# Patient Record
Sex: Male | Born: 1935 | Race: Black or African American | Hispanic: No | Smoking: Former smoker
Health system: Southern US, Community
[De-identification: ages and names within clinical notes are randomized; demographics above are authoritative.]

## PROBLEM LIST (undated history)

## (undated) DIAGNOSIS — IMO0001 Reserved for inherently not codable concepts without codable children: Secondary | ICD-10-CM

## (undated) DIAGNOSIS — M1990 Unspecified osteoarthritis, unspecified site: Secondary | ICD-10-CM

## (undated) DIAGNOSIS — I10 Essential (primary) hypertension: Secondary | ICD-10-CM

## (undated) DIAGNOSIS — I509 Heart failure, unspecified: Secondary | ICD-10-CM

## (undated) DIAGNOSIS — F419 Anxiety disorder, unspecified: Secondary | ICD-10-CM

## (undated) DIAGNOSIS — E785 Hyperlipidemia, unspecified: Secondary | ICD-10-CM

## (undated) DIAGNOSIS — C801 Malignant (primary) neoplasm, unspecified: Secondary | ICD-10-CM

## (undated) DIAGNOSIS — I2510 Atherosclerotic heart disease of native coronary artery without angina pectoris: Secondary | ICD-10-CM

## (undated) DIAGNOSIS — I739 Peripheral vascular disease, unspecified: Secondary | ICD-10-CM

## (undated) HISTORY — PX: PROSTATE SURGERY: SHX751

---

## 2003-12-29 DIAGNOSIS — C801 Malignant (primary) neoplasm, unspecified: Secondary | ICD-10-CM

## 2003-12-29 HISTORY — DX: Malignant (primary) neoplasm, unspecified: C80.1

## 2004-07-22 ENCOUNTER — Other Ambulatory Visit: Payer: Self-pay

## 2004-10-23 ENCOUNTER — Emergency Department: Payer: Self-pay | Admitting: Emergency Medicine

## 2004-11-01 ENCOUNTER — Emergency Department: Payer: Self-pay | Admitting: Emergency Medicine

## 2004-11-07 ENCOUNTER — Emergency Department: Payer: Self-pay | Admitting: Unknown Physician Specialty

## 2004-11-08 ENCOUNTER — Emergency Department: Payer: Self-pay | Admitting: Emergency Medicine

## 2004-11-10 ENCOUNTER — Emergency Department: Payer: Self-pay | Admitting: Emergency Medicine

## 2004-11-22 ENCOUNTER — Emergency Department: Payer: Self-pay | Admitting: Emergency Medicine

## 2004-12-26 ENCOUNTER — Emergency Department: Payer: Self-pay | Admitting: Emergency Medicine

## 2005-01-21 ENCOUNTER — Emergency Department: Payer: Self-pay | Admitting: Emergency Medicine

## 2005-01-30 ENCOUNTER — Emergency Department: Payer: Self-pay | Admitting: Emergency Medicine

## 2005-02-26 ENCOUNTER — Emergency Department: Payer: Self-pay | Admitting: Emergency Medicine

## 2005-05-25 ENCOUNTER — Emergency Department: Payer: Self-pay | Admitting: Emergency Medicine

## 2005-06-08 ENCOUNTER — Emergency Department: Payer: Self-pay | Admitting: Emergency Medicine

## 2005-06-13 ENCOUNTER — Emergency Department: Payer: Self-pay | Admitting: Unknown Physician Specialty

## 2005-06-15 ENCOUNTER — Emergency Department: Payer: Self-pay | Admitting: Emergency Medicine

## 2005-06-22 ENCOUNTER — Emergency Department: Payer: Self-pay | Admitting: Emergency Medicine

## 2005-07-12 ENCOUNTER — Emergency Department: Payer: Self-pay | Admitting: Internal Medicine

## 2005-12-31 ENCOUNTER — Emergency Department: Payer: Self-pay | Admitting: Emergency Medicine

## 2006-01-27 ENCOUNTER — Emergency Department: Payer: Self-pay | Admitting: General Practice

## 2006-12-24 ENCOUNTER — Ambulatory Visit: Payer: Self-pay | Admitting: Gastroenterology

## 2009-11-30 ENCOUNTER — Emergency Department: Payer: Self-pay | Admitting: Internal Medicine

## 2009-12-05 ENCOUNTER — Emergency Department: Payer: Self-pay | Admitting: Internal Medicine

## 2009-12-28 HISTORY — PX: KNEE ARTHROSCOPY: SUR90

## 2009-12-28 HISTORY — PX: JOINT REPLACEMENT: SHX530

## 2010-01-06 ENCOUNTER — Ambulatory Visit: Payer: Self-pay

## 2010-01-14 ENCOUNTER — Ambulatory Visit: Payer: Self-pay

## 2010-03-03 ENCOUNTER — Ambulatory Visit: Payer: Self-pay | Admitting: Orthopedic Surgery

## 2010-03-18 ENCOUNTER — Inpatient Hospital Stay: Payer: Self-pay | Admitting: Orthopedic Surgery

## 2010-05-01 ENCOUNTER — Ambulatory Visit: Payer: Self-pay | Admitting: Urology

## 2010-05-27 ENCOUNTER — Ambulatory Visit: Payer: Self-pay | Admitting: Orthopedic Surgery

## 2010-05-29 ENCOUNTER — Ambulatory Visit: Payer: Self-pay | Admitting: Orthopedic Surgery

## 2010-05-30 ENCOUNTER — Observation Stay: Payer: Self-pay | Admitting: Internal Medicine

## 2010-06-04 ENCOUNTER — Inpatient Hospital Stay: Payer: Self-pay | Admitting: Internal Medicine

## 2010-06-23 ENCOUNTER — Ambulatory Visit: Payer: Self-pay | Admitting: Otolaryngology

## 2010-07-09 ENCOUNTER — Emergency Department: Payer: Self-pay | Admitting: Emergency Medicine

## 2010-07-28 ENCOUNTER — Ambulatory Visit: Payer: Self-pay | Admitting: Surgery

## 2010-08-04 ENCOUNTER — Ambulatory Visit: Payer: Self-pay | Admitting: Surgery

## 2010-08-05 LAB — PATHOLOGY REPORT

## 2010-10-02 ENCOUNTER — Emergency Department: Payer: Self-pay | Admitting: Internal Medicine

## 2010-10-02 ENCOUNTER — Ambulatory Visit: Payer: Self-pay | Admitting: Orthopedic Surgery

## 2010-10-07 ENCOUNTER — Inpatient Hospital Stay: Payer: Self-pay | Admitting: Orthopedic Surgery

## 2010-10-08 LAB — PATHOLOGY REPORT

## 2010-10-23 ENCOUNTER — Emergency Department: Payer: Self-pay | Admitting: Emergency Medicine

## 2010-11-12 ENCOUNTER — Emergency Department: Payer: Self-pay | Admitting: Emergency Medicine

## 2011-02-13 ENCOUNTER — Ambulatory Visit: Payer: Self-pay | Admitting: Neurology

## 2011-10-27 ENCOUNTER — Ambulatory Visit: Payer: Self-pay | Admitting: Orthopedic Surgery

## 2012-01-18 ENCOUNTER — Ambulatory Visit: Payer: Self-pay | Admitting: Orthopedic Surgery

## 2013-11-24 ENCOUNTER — Emergency Department: Payer: Self-pay | Admitting: Emergency Medicine

## 2013-11-24 LAB — CBC
HCT: 39.5 % — ABNORMAL LOW (ref 40.0–52.0)
HGB: 13.1 g/dL (ref 13.0–18.0)
MCH: 30.1 pg (ref 26.0–34.0)
MCHC: 33.3 g/dL (ref 32.0–36.0)
MCV: 91 fL (ref 80–100)
RBC: 4.36 10*6/uL — ABNORMAL LOW (ref 4.40–5.90)
WBC: 7 10*3/uL (ref 3.8–10.6)

## 2013-11-24 LAB — BASIC METABOLIC PANEL
Calcium, Total: 9 mg/dL (ref 8.5–10.1)
Chloride: 104 mmol/L (ref 98–107)
Co2: 32 mmol/L (ref 21–32)
Creatinine: 1.17 mg/dL (ref 0.60–1.30)
Glucose: 97 mg/dL (ref 65–99)
Osmolality: 275 (ref 275–301)

## 2013-11-24 LAB — TROPONIN I
Troponin-I: 0.05 ng/mL
Troponin-I: 0.05 ng/mL

## 2013-12-28 HISTORY — PX: JOINT REPLACEMENT: SHX530

## 2014-09-08 ENCOUNTER — Emergency Department: Payer: Self-pay | Admitting: Emergency Medicine

## 2015-03-15 DIAGNOSIS — I5032 Chronic diastolic (congestive) heart failure: Secondary | ICD-10-CM | POA: Insufficient documentation

## 2015-10-14 ENCOUNTER — Encounter
Admission: RE | Admit: 2015-10-14 | Discharge: 2015-10-14 | Disposition: A | Discharge status: Home / Self Care | Payer: Medicare Other | Source: Ambulatory Visit | Attending: Orthopedic Surgery | Admitting: Orthopedic Surgery

## 2015-10-14 DIAGNOSIS — M1612 Unilateral primary osteoarthritis, left hip: Secondary | ICD-10-CM | POA: Diagnosis not present

## 2015-10-14 DIAGNOSIS — Z01818 Encounter for other preprocedural examination: Secondary | ICD-10-CM | POA: Diagnosis present

## 2015-10-14 HISTORY — DX: Malignant (primary) neoplasm, unspecified: C80.1

## 2015-10-14 HISTORY — DX: Heart failure, unspecified: I50.9

## 2015-10-14 HISTORY — DX: Unspecified osteoarthritis, unspecified site: M19.90

## 2015-10-14 HISTORY — DX: Reserved for inherently not codable concepts without codable children: IMO0001

## 2015-10-14 HISTORY — DX: Anxiety disorder, unspecified: F41.9

## 2015-10-14 HISTORY — DX: Essential (primary) hypertension: I10

## 2015-10-14 LAB — ABO/RH: ABO/RH(D): O POS

## 2015-10-14 LAB — URINALYSIS COMPLETE WITH MICROSCOPIC (ARMC ONLY)
Bacteria, UA: NONE SEEN
Bilirubin Urine: NEGATIVE
GLUCOSE, UA: NEGATIVE mg/dL
Ketones, ur: NEGATIVE mg/dL
Leukocytes, UA: NEGATIVE
Nitrite: NEGATIVE
PROTEIN: NEGATIVE mg/dL
SPECIFIC GRAVITY, URINE: 1.013 (ref 1.005–1.030)
pH: 6 (ref 5.0–8.0)

## 2015-10-14 LAB — CBC
HCT: 40.6 % (ref 40.0–52.0)
HEMOGLOBIN: 13.5 g/dL (ref 13.0–18.0)
MCH: 30.1 pg (ref 26.0–34.0)
MCHC: 33.1 g/dL (ref 32.0–36.0)
MCV: 91 fL (ref 80.0–100.0)
Platelets: 245 10*3/uL (ref 150–440)
RBC: 4.46 MIL/uL (ref 4.40–5.90)
RDW: 15 % — ABNORMAL HIGH (ref 11.5–14.5)
WBC: 7 10*3/uL (ref 3.8–10.6)

## 2015-10-14 LAB — BASIC METABOLIC PANEL
ANION GAP: 8 (ref 5–15)
BUN: 16 mg/dL (ref 6–20)
CHLORIDE: 101 mmol/L (ref 101–111)
CO2: 29 mmol/L (ref 22–32)
Calcium: 9.1 mg/dL (ref 8.9–10.3)
Creatinine, Ser: 1.14 mg/dL (ref 0.61–1.24)
GFR calc Af Amer: >60 mL/min (ref >60)
GFR, EST NON AFRICAN AMERICAN: 58 mL/min — AB (ref >60)
GLUCOSE: 102 mg/dL — AB (ref 65–99)
POTASSIUM: 3.5 mmol/L (ref 3.5–5.1)
SODIUM: 138 mmol/L (ref 135–145)

## 2015-10-14 LAB — APTT: APTT: 33 s (ref 24–36)

## 2015-10-14 LAB — SEDIMENTATION RATE: Sed Rate: 10 mm/hr (ref 0–20)

## 2015-10-14 LAB — SURGICAL PCR SCREEN
MRSA, PCR: NEGATIVE
STAPHYLOCOCCUS AUREUS: NEGATIVE

## 2015-10-14 LAB — PROTIME-INR
INR: 1.1
PROTHROMBIN TIME: 14.4 s (ref 11.4–15.0)

## 2015-10-14 LAB — TYPE AND SCREEN
ABO/RH(D): O POS
ANTIBODY SCREEN: NEGATIVE

## 2015-10-14 NOTE — Patient Instructions (Signed)
  Your procedure is scheduled EP:PIRJJOAC 1, 2016 (Tuesday) Report to Day Surgery.[facility]) To find out your arrival time please call [phone] between 1PM - 3PM on October 31,2016 (Monday).  Remember: Instructions that are not followed completely may result in serious medical risk, up to and including death, or upon the discretion of your surgeon and anesthesiologist your surgery may need to be rescheduled.    __x__ 1. Do not eat food or drink liquids after midnight. No gum chewing or hard candies.     ____ 2. No Alcohol for 24 hours before or after surgery.   ____ 3. Bring all medications with you on the day of surgery if instructed.    __x__ 4. Notify your doctor if there is any change in your medical condition     (cold, fever, infections).     Do not wear jewelry, make-up, hairpins, clips or nail polish.  Do not wear lotions, powders, or perfumes. You may wear deodorant.  Do not shave 48 hours prior to surgery. Men may shave face and neck.  Do not bring valuables to the hospital.    [facility] is not responsible for any belongings or valuables.               Contacts, dentures or bridgework may not be worn into surgery.  Leave your suitcase in the car. After surgery it may be brought to your room.  For patients admitted to the hospital, discharge time is determined by your                treatment team.   Patients discharged the day of surgery will not be allowed to drive home.   Please read over the following fact sheets that you were given:   MRSA Information and Surgical Site Infection Prevention   ____ Take these medicines the morning of surgery with A SIP OF WATER:    1. Atenolol  2.   3.   4.  5.  6.  ____ Fleet Enema (as directed)   __x__ Use CHG Soap as directed  ____ Use inhalers on the day of surgery  ____ Stop metformin 2 days prior to surgery    ____ Take 1/2 of usual insulin dose the night before surgery and none on the morning of surgery.    ____ Stop Coumadin/Plavix/aspirin on _x___ Stop Anti-inflammatories on (Tylenol ok to take for pain if needed)     ____ Stop supplements until after surgery.    ____ Bring C-Pap to the hospital.

## 2015-10-15 ENCOUNTER — Other Ambulatory Visit: Payer: Self-pay

## 2015-10-29 ENCOUNTER — Encounter: Payer: Self-pay | Admitting: *Deleted

## 2015-10-29 ENCOUNTER — Inpatient Hospital Stay
Admission: RE | Admit: 2015-10-29 | Discharge: 2015-11-01 | DRG: 470 | Disposition: A | Discharge status: Skilled Nursing Facility | Payer: Medicare Other | Source: Ambulatory Visit | Attending: Orthopedic Surgery | Admitting: Orthopedic Surgery

## 2015-10-29 ENCOUNTER — Inpatient Hospital Stay: Payer: Medicare Other

## 2015-10-29 ENCOUNTER — Inpatient Hospital Stay: Payer: Medicare Other | Admitting: Anesthesiology

## 2015-10-29 ENCOUNTER — Encounter
Admission: RE | Disposition: A | Discharge status: Skilled Nursing Facility | Payer: Self-pay | Source: Ambulatory Visit | Attending: Orthopedic Surgery

## 2015-10-29 DIAGNOSIS — E785 Hyperlipidemia, unspecified: Secondary | ICD-10-CM | POA: Diagnosis present

## 2015-10-29 DIAGNOSIS — D62 Acute posthemorrhagic anemia: Secondary | ICD-10-CM | POA: Diagnosis not present

## 2015-10-29 DIAGNOSIS — Z8546 Personal history of malignant neoplasm of prostate: Secondary | ICD-10-CM

## 2015-10-29 DIAGNOSIS — M1612 Unilateral primary osteoarthritis, left hip: Secondary | ICD-10-CM | POA: Diagnosis present

## 2015-10-29 DIAGNOSIS — G8918 Other acute postprocedural pain: Secondary | ICD-10-CM

## 2015-10-29 DIAGNOSIS — Z79899 Other long term (current) drug therapy: Secondary | ICD-10-CM | POA: Diagnosis not present

## 2015-10-29 DIAGNOSIS — Z96649 Presence of unspecified artificial hip joint: Secondary | ICD-10-CM | POA: Diagnosis present

## 2015-10-29 DIAGNOSIS — Z880 Allergy status to penicillin: Secondary | ICD-10-CM

## 2015-10-29 DIAGNOSIS — I509 Heart failure, unspecified: Secondary | ICD-10-CM | POA: Diagnosis present

## 2015-10-29 DIAGNOSIS — F419 Anxiety disorder, unspecified: Secondary | ICD-10-CM | POA: Diagnosis present

## 2015-10-29 DIAGNOSIS — Z96651 Presence of right artificial knee joint: Secondary | ICD-10-CM | POA: Diagnosis present

## 2015-10-29 DIAGNOSIS — R509 Fever, unspecified: Secondary | ICD-10-CM

## 2015-10-29 DIAGNOSIS — Z419 Encounter for procedure for purposes other than remedying health state, unspecified: Secondary | ICD-10-CM

## 2015-10-29 DIAGNOSIS — I110 Hypertensive heart disease with heart failure: Secondary | ICD-10-CM | POA: Diagnosis present

## 2015-10-29 DIAGNOSIS — Z96641 Presence of right artificial hip joint: Secondary | ICD-10-CM | POA: Diagnosis present

## 2015-10-29 HISTORY — PX: TOTAL HIP ARTHROPLASTY: SHX124

## 2015-10-29 LAB — TYPE AND SCREEN
ABO/RH(D): O POS
Antibody Screen: NEGATIVE

## 2015-10-29 LAB — CREATININE, SERUM
CREATININE: 1.47 mg/dL — AB (ref 0.61–1.24)
GFR calc Af Amer: 49 mL/min — ABNORMAL LOW (ref >60)
GFR, EST NON AFRICAN AMERICAN: 42 mL/min — AB (ref >60)

## 2015-10-29 LAB — CBC
HCT: 39 % — ABNORMAL LOW (ref 40.0–52.0)
HEMOGLOBIN: 13.1 g/dL (ref 13.0–18.0)
MCH: 30.7 pg (ref 26.0–34.0)
MCHC: 33.6 g/dL (ref 32.0–36.0)
MCV: 91.4 fL (ref 80.0–100.0)
PLATELETS: 274 10*3/uL (ref 150–440)
RBC: 4.27 MIL/uL — AB (ref 4.40–5.90)
RDW: 14.7 % — ABNORMAL HIGH (ref 11.5–14.5)
WBC: 8.7 10*3/uL (ref 3.8–10.6)

## 2015-10-29 SURGERY — ARTHROPLASTY, HIP, TOTAL, ANTERIOR APPROACH
Anesthesia: Spinal | Site: Hip | Laterality: Left | Wound class: Clean

## 2015-10-29 MED ORDER — DOCUSATE SODIUM 100 MG PO CAPS
100.0000 mg | ORAL_CAPSULE | Freq: Two times a day (BID) | ORAL | Status: DC
  Administered 2015-10-29 – 2015-11-01 (×6): 100 mg via ORAL
  Filled 2015-10-29 (×6): qty 1

## 2015-10-29 MED ORDER — ONDANSETRON HCL 4 MG PO TABS: 4.0000 mg | ORAL_TABLET | Freq: Four times a day (QID) | ORAL | Status: DC | PRN

## 2015-10-29 MED ORDER — METOCLOPRAMIDE HCL 5 MG/ML IJ SOLN: 5.0000 mg | Freq: Three times a day (TID) | INTRAMUSCULAR | Status: DC | PRN

## 2015-10-29 MED ORDER — BUPIVACAINE-EPINEPHRINE (PF) 0.25% -1:200000 IJ SOLN
INTRAMUSCULAR | Status: AC
  Filled 2015-10-29: qty 30

## 2015-10-29 MED ORDER — CLINDAMYCIN PHOSPHATE 900 MG/50ML IV SOLN
900.0000 mg | Freq: Four times a day (QID) | INTRAVENOUS | Status: AC
  Administered 2015-10-29 (×3): 900 mg via INTRAVENOUS
  Filled 2015-10-29 (×4): qty 50

## 2015-10-29 MED ORDER — PHENOL 1.4 % MT LIQD
1.0000 | OROMUCOSAL | Status: DC | PRN
  Filled 2015-10-29: qty 177

## 2015-10-29 MED ORDER — KETAMINE HCL 50 MG/ML IJ SOLN
INTRAMUSCULAR | Status: DC | PRN
  Administered 2015-10-29 (×2): 2 mg via INTRAMUSCULAR

## 2015-10-29 MED ORDER — LISINOPRIL 20 MG PO TABS
20.0000 mg | ORAL_TABLET | Freq: Every day | ORAL | Status: DC
  Administered 2015-10-29 – 2015-11-01 (×4): 20 mg via ORAL
  Filled 2015-10-29 (×4): qty 1

## 2015-10-29 MED ORDER — MORPHINE SULFATE (PF) 2 MG/ML IV SOLN: 2.0000 mg | INTRAVENOUS | Status: DC | PRN

## 2015-10-29 MED ORDER — OXYCODONE HCL 5 MG PO TABS
5.0000 mg | ORAL_TABLET | ORAL | Status: DC | PRN
  Administered 2015-10-29 – 2015-11-01 (×7): 5 mg via ORAL
  Filled 2015-10-29 (×7): qty 1

## 2015-10-29 MED ORDER — NEOMYCIN-POLYMYXIN B GU 40-200000 IR SOLN
Status: DC | PRN
  Administered 2015-10-29: 4 mL

## 2015-10-29 MED ORDER — FENTANYL CITRATE (PF) 100 MCG/2ML IJ SOLN
INTRAMUSCULAR | Status: DC | PRN
  Administered 2015-10-29: 100 ug via INTRAVENOUS

## 2015-10-29 MED ORDER — FENTANYL CITRATE (PF) 100 MCG/2ML IJ SOLN: 25.0000 ug | INTRAMUSCULAR | Status: DC | PRN

## 2015-10-29 MED ORDER — FAMOTIDINE 20 MG PO TABS
20.0000 mg | ORAL_TABLET | Freq: Once | ORAL | Status: AC
  Administered 2015-10-29: 20 mg via ORAL

## 2015-10-29 MED ORDER — ALUM & MAG HYDROXIDE-SIMETH 200-200-20 MG/5ML PO SUSP: 30.0000 mL | ORAL | Status: DC | PRN

## 2015-10-29 MED ORDER — ENOXAPARIN SODIUM 40 MG/0.4ML ~~LOC~~ SOLN
40.0000 mg | SUBCUTANEOUS | Status: DC
  Administered 2015-10-30 – 2015-11-01 (×3): 40 mg via SUBCUTANEOUS
  Filled 2015-10-29 (×3): qty 0.4

## 2015-10-29 MED ORDER — MAGNESIUM HYDROXIDE 400 MG/5ML PO SUSP
30.0000 mL | Freq: Every day | ORAL | Status: DC | PRN
  Administered 2015-10-30 – 2015-10-31 (×2): 30 mL via ORAL
  Filled 2015-10-29 (×2): qty 30

## 2015-10-29 MED ORDER — MENTHOL 3 MG MT LOZG
1.0000 | LOZENGE | OROMUCOSAL | Status: DC | PRN
  Filled 2015-10-29: qty 9

## 2015-10-29 MED ORDER — TRANEXAMIC ACID 1000 MG/10ML IV SOLN
INTRAVENOUS | Status: AC
  Filled 2015-10-29: qty 10

## 2015-10-29 MED ORDER — BUPIVACAINE IN DEXTROSE 0.75-8.25 % IT SOLN
INTRATHECAL | Status: DC | PRN
  Administered 2015-10-29: 2 mL via INTRATHECAL

## 2015-10-29 MED ORDER — PROPOFOL 10 MG/ML IV BOLUS
INTRAVENOUS | Status: DC | PRN
Start: 2015-10-29 — End: 2015-10-29
  Administered 2015-10-29 (×2): 20 mg via INTRAVENOUS

## 2015-10-29 MED ORDER — LACTATED RINGERS IV SOLN
INTRAVENOUS | Status: DC
  Administered 2015-10-29 (×2): via INTRAVENOUS

## 2015-10-29 MED ORDER — METHOCARBAMOL 500 MG PO TABS: 500.0000 mg | ORAL_TABLET | Freq: Four times a day (QID) | ORAL | Status: DC | PRN

## 2015-10-29 MED ORDER — TRANEXAMIC ACID 1000 MG/10ML IV SOLN
1000.0000 mg | INTRAVENOUS | Status: DC | PRN
  Administered 2015-10-29: 1000 mg via INTRAVENOUS

## 2015-10-29 MED ORDER — CLINDAMYCIN PHOSPHATE 900 MG/50ML IV SOLN
INTRAVENOUS | Status: AC
  Administered 2015-10-29: 900 mg via INTRAVENOUS
  Filled 2015-10-29: qty 50

## 2015-10-29 MED ORDER — ATENOLOL 25 MG PO TABS
25.0000 mg | ORAL_TABLET | Freq: Every day | ORAL | Status: DC
  Administered 2015-10-30 – 2015-11-01 (×3): 25 mg via ORAL
  Filled 2015-10-29 (×3): qty 1

## 2015-10-29 MED ORDER — ONDANSETRON HCL 4 MG/2ML IJ SOLN: 4.0000 mg | Freq: Four times a day (QID) | INTRAMUSCULAR | Status: DC | PRN

## 2015-10-29 MED ORDER — METOCLOPRAMIDE HCL 5 MG PO TABS: 5.0000 mg | ORAL_TABLET | Freq: Three times a day (TID) | ORAL | Status: DC | PRN

## 2015-10-29 MED ORDER — CLINDAMYCIN PHOSPHATE 900 MG/50ML IV SOLN: 900.0000 mg | Freq: Once | INTRAVENOUS | Status: DC

## 2015-10-29 MED ORDER — HYDROCHLOROTHIAZIDE 25 MG PO TABS
25.0000 mg | ORAL_TABLET | Freq: Every day | ORAL | Status: DC
  Administered 2015-10-29 – 2015-11-01 (×4): 25 mg via ORAL
  Filled 2015-10-29 (×4): qty 1

## 2015-10-29 MED ORDER — LISINOPRIL-HYDROCHLOROTHIAZIDE 20-25 MG PO TABS: 1.0000 | ORAL_TABLET | Freq: Every day | ORAL | Status: DC

## 2015-10-29 MED ORDER — ACETAMINOPHEN 325 MG PO TABS
650.0000 mg | ORAL_TABLET | Freq: Four times a day (QID) | ORAL | Status: DC | PRN
  Administered 2015-11-01: 650 mg via ORAL
  Filled 2015-10-29: qty 2

## 2015-10-29 MED ORDER — FAMOTIDINE 20 MG PO TABS
ORAL_TABLET | ORAL | Status: AC
  Administered 2015-10-29: 20 mg via ORAL
  Filled 2015-10-29: qty 1

## 2015-10-29 MED ORDER — SODIUM CHLORIDE 0.9 % IV SOLN
10000.0000 ug | INTRAVENOUS | Status: DC | PRN
  Administered 2015-10-29: 20 ug/min via INTRAVENOUS

## 2015-10-29 MED ORDER — BISACODYL 10 MG RE SUPP
10.0000 mg | Freq: Every day | RECTAL | Status: DC | PRN
  Administered 2015-11-01: 10 mg via RECTAL
  Filled 2015-10-29: qty 1

## 2015-10-29 MED ORDER — BUPIVACAINE-EPINEPHRINE 0.25% -1:200000 IJ SOLN
INTRAMUSCULAR | Status: DC | PRN
  Administered 2015-10-29: 30 mL

## 2015-10-29 MED ORDER — PROPOFOL 500 MG/50ML IV EMUL
INTRAVENOUS | Status: DC | PRN
  Administered 2015-10-29: 25 ug/kg/min via INTRAVENOUS

## 2015-10-29 MED ORDER — KETAMINE HCL 100 MG/ML IJ SOLN
250.0000 mg | INTRAMUSCULAR | Status: DC | PRN
  Administered 2015-10-29: 2.5 ug/kg/min via INTRAVENOUS

## 2015-10-29 MED ORDER — DIAZEPAM 5 MG PO TABS
5.0000 mg | ORAL_TABLET | Freq: Three times a day (TID) | ORAL | Status: DC | PRN
  Administered 2015-10-30 – 2015-10-31 (×2): 5 mg via ORAL
  Filled 2015-10-29 (×3): qty 1

## 2015-10-29 MED ORDER — MAGNESIUM CITRATE PO SOLN
1.0000 | Freq: Once | ORAL | Status: DC | PRN
  Filled 2015-10-29: qty 296

## 2015-10-29 MED ORDER — SODIUM CHLORIDE 0.9 % IV SOLN
INTRAVENOUS | Status: DC
  Administered 2015-10-29 – 2015-10-30 (×3): via INTRAVENOUS

## 2015-10-29 MED ORDER — NEOMYCIN-POLYMYXIN B GU 40-200000 IR SOLN
Status: AC
  Filled 2015-10-29: qty 20

## 2015-10-29 MED ORDER — METHOCARBAMOL 1000 MG/10ML IJ SOLN: 500.0000 mg | Freq: Four times a day (QID) | INTRAVENOUS | Status: DC | PRN

## 2015-10-29 MED ORDER — ACETAMINOPHEN 650 MG RE SUPP: 650.0000 mg | Freq: Four times a day (QID) | RECTAL | Status: DC | PRN

## 2015-10-29 SURGICAL SUPPLY — 45 items
BLADE SAW 1/2 (BLADE) ×3 IMPLANT
BNDG COHESIVE 6X5 TAN STRL LF (GAUZE/BANDAGES/DRESSINGS) ×6 IMPLANT
CANISTER SUCT 1200ML W/VALVE (MISCELLANEOUS) ×3 IMPLANT
CAPT HIP TOTAL 3 ×3 IMPLANT
CATH FOL LEG HOLDER (MISCELLANEOUS) ×3 IMPLANT
CATH TRAY METER 16FR LF (MISCELLANEOUS) ×3 IMPLANT
CHLORAPREP W/TINT 26ML (MISCELLANEOUS) ×3 IMPLANT
DRAPE C-ARM XRAY 36X54 (DRAPES) ×3 IMPLANT
DRAPE INCISE IOBAN 66X60 STRL (DRAPES) IMPLANT
DRAPE POUCH INSTRU U-SHP 10X18 (DRAPES) ×3 IMPLANT
DRAPE SHEET LG 3/4 BI-LAMINATE (DRAPES) ×9 IMPLANT
DRAPE TABLE BACK 80X90 (DRAPES) ×3 IMPLANT
DRSG OPSITE POSTOP 4X10 (GAUZE/BANDAGES/DRESSINGS) ×3 IMPLANT
ELECT BLADE 6.5 EXT (BLADE) ×3 IMPLANT
GAUZE SPONGE 4X4 12PLY STRL (GAUZE/BANDAGES/DRESSINGS) ×3 IMPLANT
GLOVE BIOGEL PI IND STRL 9 (GLOVE) ×1 IMPLANT
GLOVE BIOGEL PI INDICATOR 9 (GLOVE) ×2
GLOVE SURG ORTHO 9.0 STRL STRW (GLOVE) ×3 IMPLANT
GOWN SPECIALTY ULTRA XL (MISCELLANEOUS) ×3 IMPLANT
GOWN STRL REUS W/ TWL LRG LVL3 (GOWN DISPOSABLE) ×1 IMPLANT
GOWN STRL REUS W/TWL LRG LVL3 (GOWN DISPOSABLE) ×2
HEMOVAC 400CC 10FR (MISCELLANEOUS) ×3 IMPLANT
HOOD PEEL AWAY FACE SHEILD DIS (HOOD) ×3 IMPLANT
MAT BLUE FLOOR 46X72 FLO (MISCELLANEOUS) ×3 IMPLANT
NDL SAFETY 18GX1.5 (NEEDLE) ×3 IMPLANT
NEEDLE SPNL 18GX3.5 QUINCKE PK (NEEDLE) ×3 IMPLANT
NS IRRIG 1000ML POUR BTL (IV SOLUTION) ×3 IMPLANT
PACK HIP COMPR (MISCELLANEOUS) ×3 IMPLANT
SOL PREP PVP 2OZ (MISCELLANEOUS) ×3
SOLUTION PREP PVP 2OZ (MISCELLANEOUS) ×1 IMPLANT
STAPLER SKIN PROX 35W (STAPLE) ×3 IMPLANT
STRAP SAFETY BODY (MISCELLANEOUS) ×3 IMPLANT
SUT DVC 2 QUILL PDO  T11 36X36 (SUTURE) ×2
SUT DVC 2 QUILL PDO T11 36X36 (SUTURE) ×1 IMPLANT
SUT DVC QUILL MONODERM 30X30 (SUTURE) ×3 IMPLANT
SUT ETHIBOND NAB CT1 #1 30IN (SUTURE) ×3 IMPLANT
SUT SILK 0 (SUTURE) ×2
SUT SILK 0 30XBRD TIE 6 (SUTURE) ×1 IMPLANT
SUT VIC AB 1 CT1 36 (SUTURE) ×3 IMPLANT
SUT VIC AB 2-0 CT1 (SUTURE) ×3 IMPLANT
SYR 20CC LL (SYRINGE) ×3 IMPLANT
SYR 30ML LL (SYRINGE) ×3 IMPLANT
TAPE MICROFOAM 4IN (TAPE) ×3 IMPLANT
TUBE KAMVAC SUCTION (TUBING) ×3 IMPLANT
WATER STERILE IRR 1000ML POUR (IV SOLUTION) IMPLANT

## 2015-10-29 NOTE — Progress Notes (Signed)
From OR. IV out on arrival. IV restarted with #20 left hand with LR infusing without difficulty. Tolerated well.

## 2015-10-29 NOTE — Anesthesia Preprocedure Evaluation (Signed)
Anesthesia Evaluation  Patient identified by MRN, date of birth, ID band Patient awake    Reviewed: Allergy & Precautions, H&P , NPO status , Patient's Chart, lab work & pertinent test results, reviewed documented beta blocker date and time   History of Anesthesia Complications Negative for: history of anesthetic complications  Airway Mallampati: I  TM Distance: >3 FB Neck ROM: full    Dental no notable dental hx. (+) Edentulous Upper, Edentulous Lower, Upper Dentures, Lower Dentures   Pulmonary neg shortness of breath, neg sleep apnea, neg COPD, neg recent URI, former smoker,    Pulmonary exam normal breath sounds clear to auscultation       Cardiovascular Exercise Tolerance: Good hypertension, On Medications and On Home Beta Blockers (-) angina+CHF (one episode many years ago)  (-) CAD, (-) Past MI, (-) Cardiac Stents and (-) CABG Normal cardiovascular exam(-) dysrhythmias (-) Valvular Problems/Murmurs Rhythm:regular Rate:Normal     Neuro/Psych PSYCHIATRIC DISORDERS (anxiety) negative neurological ROS     GI/Hepatic negative GI ROS, Neg liver ROS,   Endo/Other  negative endocrine ROS  Renal/GU negative Renal ROS  negative genitourinary   Musculoskeletal   Abdominal   Peds  Hematology negative hematology ROS (+)   Anesthesia Other Findings Past Medical History:   Hypertension                                                 Anxiety                                                      Arthritis                                                    Cancer ([place])                                    2005           Comment:Prostate   CHF (congestive heart failure) (HCC)                         Shortness of breath dyspnea                                    Comment:on exertion   Reproductive/Obstetrics negative OB ROS                             Anesthesia Physical Anesthesia Plan  ASA:  III  Anesthesia Plan: Spinal   Post-op Pain Management:    Induction:   Airway Management Planned:   Additional Equipment:   Intra-op Plan:   Post-operative Plan:   Informed Consent: I have reviewed the patients History and Physical, chart, labs and discussed the procedure including the risks, benefits and alternatives for the proposed anesthesia with the patient or authorized representative  who has indicated his/her understanding and acceptance.   Dental Advisory Given  Plan Discussed with: Anesthesiologist, CRNA and Surgeon  Anesthesia Plan Comments:         Anesthesia Quick Evaluation

## 2015-10-29 NOTE — Transfer of Care (Signed)
Immediate Anesthesia Transfer of Care Note  Patient: Kyle Whitaker  Procedure(s) Performed: Procedure(s): TOTAL HIP ARTHROPLASTY ANTERIOR APPROACH (Left)  Patient Location: PACU  Anesthesia Type:Spinal  Level of Consciousness: awake, alert , oriented and patient cooperative  Airway & Oxygen Therapy: Patient Spontanous Breathing and Patient connected to nasal cannula oxygen  Post-op Assessment: Report given to RN and Post -op Vital signs reviewed and stable  Post vital signs: Reviewed and stable  Last Vitals:  Filed Vitals:   10/29/15 0944  BP: 111/64  Pulse: 62  Temp: 36.1 C  Resp: 16    Complications: No apparent anesthesia complications

## 2015-10-29 NOTE — Op Note (Signed)
10/29/2015  9:52 AM  PATIENT:  Kyle Whitaker  81 y.o. male  PRE-OPERATIVE DIAGNOSIS:  OSTEOARTHRITIS left hip  POST-OPERATIVE DIAGNOSIS:  OSTEOARTHRITIS left hip  PROCEDURE:  Procedure(s): TOTAL HIP ARTHROPLASTY ANTERIOR APPROACH (Left)  SURGEON: Laurene Footman, MD  ASSISTANTS: None  ANESTHESIA:   spinal  EBL:  Total I/O In: 1500 [I.V.:1500] Out: 550 [Urine:50; Blood:500]  BLOOD ADMINISTERED:none  DRAINS: none   LOCAL MEDICATIONS USED:  MARCAINE     SPECIMEN:  Source of Specimen:  Left femoral head  DISPOSITION OF SPECIMEN:  PATHOLOGY  COUNTS:  YES  TOURNIQUET:  * No tourniquets in log *  IMPLANTS: Medacta AMIS 4 lateralized stem, S 28 mm head, 52 mm Mpact cup DM with liner  DICTATION: .Dragon Dictation   The patient was brought to the operating room and after spinal anesthesia was obtained patient was placed on the operative table with the ipsilateral foot into the Medacta attachment, contralateral leg on a well-padded table. C-arm was brought in and preop template x-ray taken. After prepping and draping in usual sterile fashion appropriate patient identification and timeout procedures were completed. Anterior approach to the hip was obtained and centered over the greater trochanter and TFL muscle. The subcutaneous tissue was incised hemostasis being achieved by electrocautery. TFL fascia was incised and the muscle retracted laterally deep retractor placed. The lateral femoral circumflex vessels were identified and ligated. The anterior capsule was exposed and a capsulotomy performed. The neck was identified and a femoral neck cut carried out with a saw. The head was removed without difficulty and showed sclerotic femoral head and acetabulum. Reaming was carried out to 52 mm and a 52 mm cup trial gave appropriate tightness to the acetabular component a 52 mpact cup DM was impacted into position. There were extensive osteophytes along the posterior and medial acetabulum that  might've impinged and these were removed using an osteotome and rongeur area The leg was then externally rotated and ischiofemoral and pubofemoral releases carried out. The femur was sequentially broached to a size 4, size 4 stem standard and then lateralized offset trials were placed and the final components chosen. The 4 lateral stem was inserted along with a S 28 mm head and 52 mm liner. The hip was reduced and was stable the wound was thoroughly irrigated with a dilute Betadine solution. The deep fascia was closed using a heavy Quill after infiltration of 30 cc of quarter percent Sensorcaine with epinephrine. TXA was then injected into the joint.2-0 Quill to close the skin with skin staples Xeroform honeycomb dressing cover the wound .  PLAN OF CARE: Admit to inpatient

## 2015-10-29 NOTE — H&P (Signed)
Reviewed paper H+P, will be scanned into chart. No changes noted.  

## 2015-10-29 NOTE — Anesthesia Procedure Notes (Signed)
Spinal Patient location during procedure: OR Start time: 10/29/2015 7:20 AM End time: 10/29/2015 7:36 AM Staffing Anesthesiologist: Martha Clan Performed by: anesthesiologist  Preanesthetic Checklist Completed: patient identified, site marked, surgical consent, pre-op evaluation, timeout performed, IV checked, risks and benefits discussed and monitors and equipment checked Spinal Block Patient position: sitting Prep: ChloraPrep Patient monitoring: heart rate, blood pressure and continuous pulse ox Approach: midline Location: L4-5 Injection technique: single-shot Needle Needle type: Whitacre and Introducer  Needle gauge: 25 G Needle length: 9 cm

## 2015-10-29 NOTE — Progress Notes (Signed)
AP/lateral xray left hip.

## 2015-10-29 NOTE — Evaluation (Signed)
Physical Therapy Evaluation Patient Details Name: Kyle Whitaker MRN: [id number] DOB: March 21, 1930 Today's Date: 10/29/2015   History of Present Illness  Pt underwent L THR anterior approach without reported operative complications. He is POD#0 at time of evaluation. Pt is somewhat difficulty to understand at times but is AOx4 at time of evaluation. Pt denies falls in the last 12 months. He reports use of rollator for ambulation prior to admission.  Clinical Impression  Pt demonstrates considerable weakness with bed mobility, transfers, and ambulation. He is weak in standing with poor balance and difficulty shifting weight to LLE. Pt would like to return home but in current condition would need SNF placement due to gross weakness and difficulty with mobility. Pt will benefit from skilled PT services to address deficits in strength, balance, and mobility in order to return to full function at home.     Follow Up Recommendations SNF (Pt unsure if he would be willing to go)    Equipment Recommendations  None recommended by PT    Recommendations for Other Services       Precautions / Restrictions Precautions Precautions: Anterior Hip;Fall Precaution Booklet Issued: Yes (comment) Restrictions Weight Bearing Restrictions: Yes LLE Weight Bearing: Weight bearing as tolerated      Mobility  Bed Mobility Overal bed mobility: Needs Assistance Bed Mobility: Supine to Sit     Supine to sit: Mod assist;+2 for physical assistance     General bed mobility comments: Poor LLE abduction strength. Cues for sequencing  Transfers Overall transfer level: Needs assistance Equipment used: Rolling walker (2 wheeled) Transfers: Sit to/from Stand Sit to Stand: Mod assist;+2 physical assistance         General transfer comment: Pt requires cues for sequencing with transfer. Decreased LE power noted with increased time required to perform  Ambulation/Gait Ambulation/Gait assistance: Mod assist;+2  physical assistance Ambulation Distance (Feet): 3 Feet Assistive device: Rolling walker (2 wheeled) Gait Pattern/deviations: Step-to pattern;Antalgic;Decreased step length - right;Decreased step length - left Gait velocity: Decreased Gait velocity interpretation: <1.8 ft/sec, indicative of risk for recurrent falls General Gait Details: Pt requires increased time and heavy cues for proper sequencing to transfer from bed to recliner. Cues for upright posture and hip and knee extension.  Stairs            Wheelchair Mobility    Modified Rankin (Stroke Patients Only)       Balance Overall balance assessment: Needs assistance   Sitting balance-Leahy Scale: Fair       Standing balance-Leahy Scale: Poor                               Pertinent Vitals/Pain Pain Assessment: 0-10 Pain Score: 5  Pain Location: L hip Pain Intervention(s): Monitored during session;Premedicated before session    Home Living Family/patient expects to be discharged to:: Private residence Living Arrangements: Children Available Help at Discharge: Family Type of Home: House Home Access: Stairs to enter Entrance Stairs-Rails: Can reach both Entrance Stairs-Number of Steps: 1 Home Layout: One level Home Equipment: Environmental consultant - 2 wheels;Walker - 4 wheels;Shower seat;Wheelchair - manual;Grab bars - tub/shower (no BSC)      Prior Function Level of Independence: Independent with assistive device(s)               Hand Dominance        Extremity/Trunk Assessment   Upper Extremity Assessment: Overall WFL for tasks assessed  Lower Extremity Assessment: LLE deficits/detail   LLE Deficits / Details: Assist to perform SLR and SAQ. Full DF/PF. Pt denies numbness/tingling in LLE     Communication   Communication: Other (comment) (Difficult to understand at times)  Cognition Arousal/Alertness: Awake/alert Behavior During Therapy: WFL for tasks assessed/performed Overall  Cognitive Status: Within Functional Limits for tasks assessed                      General Comments      Exercises General Exercises - Lower Extremity Ankle Circles/Pumps: Strengthening;Both;10 reps;Supine Quad Sets: Strengthening;Both;10 reps;Supine Gluteal Sets: Strengthening;Both;10 reps;Supine Short Arc Quad: Strengthening;Both;10 reps;Supine Heel Slides: Strengthening;Both;10 reps;Supine Hip ABduction/ADduction: Strengthening;Both;10 reps;Supine Straight Leg Raises: Strengthening;Both;10 reps;Supine      Assessment/Plan    PT Assessment Patient needs continued PT services  PT Diagnosis Difficulty walking;Abnormality of gait;Generalized weakness;Acute pain   PT Problem List Decreased strength;Decreased range of motion;Decreased activity tolerance;Decreased balance;Decreased mobility;Decreased safety awareness;Pain  PT Treatment Interventions DME instruction;Gait training;Stair training;Therapeutic activities;Therapeutic exercise;Balance training;Neuromuscular re-education;Patient/family education;Manual techniques   PT Goals (Current goals can be found in the Care Plan section) Acute Rehab PT Goals Patient Stated Goal: "I think I want to go home if possible" PT Goal Formulation: With patient Time For Goal Achievement: 11/12/15 Potential to Achieve Goals: Fair    Frequency BID   Barriers to discharge        Co-evaluation               End of Session Equipment Utilized During Treatment: Gait belt Activity Tolerance: Patient tolerated treatment well Patient left: in chair;with call bell/phone within reach;with chair alarm set;with family/visitor present Nurse Communication: Mobility status         Time: 5427-0623 PT Time Calculation (min) (ACUTE ONLY): 35 min   Charges:   PT Evaluation $Initial PT Evaluation Tier I: 1 Procedure PT Treatments $Therapeutic Exercise: 8-22 mins   PT G Codes:       Lyndel Safe Khylie Larmore PT, DPT    [place] 10/29/2015, 4:50 PM

## 2015-10-30 MED ORDER — WHITE PETROLATUM GEL
Freq: Two times a day (BID) | Status: DC
  Administered 2015-10-30: 1 via TOPICAL
  Administered 2015-10-30 – 2015-11-01 (×4): via TOPICAL
  Filled 2015-10-30 (×2): qty 5
  Filled 2015-10-30: qty 28.35
  Filled 2015-10-30: qty 5
  Filled 2015-10-30: qty 28.35
  Filled 2015-10-30: qty 5
  Filled 2015-10-30: qty 28.35
  Filled 2015-10-30 (×2): qty 5

## 2015-10-30 NOTE — Progress Notes (Signed)
Physical Therapy Treatment Patient Details Name: Kyle Whitaker MRN: [id number] DOB: 03-04-1930 Today's Date: 10/30/2015    History of Present Illness This patient is an 81 year old male who came to [facility] for a L THR (anterior)    PT Comments    Pt demonstrates very slight improvement in strength and mobility on this date. He is still grossly weak with transfers and ambulation and severely limited in distance. Pt able to perform seated exercises but reports increased pain with hip flexion in sitting. Pt will benefit from skilled PT services to address deficits in strength, balance, and mobility in order to return to full function at home.    Follow Up Recommendations  SNF (Pt unsure if he would be willing to go)     Equipment Recommendations  None recommended by PT    Recommendations for Other Services       Precautions / Restrictions Precautions Precautions: Anterior Hip;Fall Precaution Booklet Issued: Yes (comment) (Per PT) Restrictions Weight Bearing Restrictions: Yes LLE Weight Bearing: Weight bearing as tolerated    Mobility  Bed Mobility Overal bed mobility: Needs Assistance Bed Mobility: Supine to Sit     Supine to sit: Mod assist;HOB elevated     General bed mobility comments: Poor LLE abduction strength. Cues for sequencing  Transfers Overall transfer level: Needs assistance Equipment used: Rolling walker (2 wheeled) Transfers: Sit to/from Stand Sit to Stand: Mod assist;+2 physical assistance         General transfer comment: Pt with decreased hip and knee extension strength. Cues for hand placement and sequencing as well as posture once upright in standing  Ambulation/Gait Ambulation/Gait assistance: Min assist;+2 physical assistance Ambulation Distance (Feet): 5 Feet Assistive device: Rolling walker (2 wheeled) Gait Pattern/deviations: Step-to pattern;Decreased step length - right;Decreased stance time - left;Decreased weight shift to left Gait  velocity: Decreased Gait velocity interpretation: <1.8 ft/sec, indicative of risk for recurrent falls General Gait Details: Pt is very slow and labored with gait. He stands in crouched posture requiring cues for full upright posture. Pt with decreased step length bilateral but particularly on the R. Decreased weight shift to LLE. Pt slowly requires more assistance until modA+2. Chair is pulled up underneath patient.   Stairs            Wheelchair Mobility    Modified Rankin (Stroke Patients Only)       Balance Overall balance assessment: Needs assistance   Sitting balance-Leahy Scale: Fair       Standing balance-Leahy Scale: Poor                      Cognition Arousal/Alertness: Awake/alert Behavior During Therapy: WFL for tasks assessed/performed Overall Cognitive Status: Within Functional Limits for tasks assessed                      Exercises General Exercises - Lower Extremity Ankle Circles/Pumps: Strengthening;Both;Supine;15 reps Quad Sets: Strengthening;Both;Supine;15 reps Gluteal Sets: Strengthening;Both;Supine;15 reps Long Arc Quad: Strengthening;Left;15 reps;Seated Hip ABduction/ADduction: Strengthening;Both;15 reps;Seated Straight Leg Raises: Strengthening;Both;Supine;15 reps Hip Flexion/Marching: Strengthening;Both;Seated;15 reps Heel Raises: Strengthening;15 reps;Left;Seated    General Comments        Pertinent Vitals/Pain Pain Assessment: 0-10 Pain Score: 6  Pain Location: L hip Pain Intervention(s): Monitored during session    Home Living Family/patient expects to be discharged to:: Private residence Living Arrangements: Children Available Help at Discharge: Family Type of Home: House Home Access: Stairs to enter Entrance Stairs-Rails: Can reach both Home Layout:  One level Home Equipment: [place] - 2 wheels;Walker - 4 wheels;Shower seat;Wheelchair - manual;Grab bars - tub/shower      Prior Function Level of Independence:  Independent with assistive device(s)      Comments: reports getting some assist with bathing from son   PT Goals (current goals can now be found in the care plan section) Acute Rehab PT Goals Patient Stated Goal: Would rather go home PT Goal Formulation: With patient Time For Goal Achievement: 11/12/15 Potential to Achieve Goals: Fair Progress towards PT goals: Progressing toward goals    Frequency  BID    PT Plan Current plan remains appropriate    Co-evaluation             End of Session Equipment Utilized During Treatment: Gait belt Activity Tolerance: Patient tolerated treatment well Patient left: with call bell/phone within reach;with chair alarm set;in chair;with SCD's reapplied (ice pack on hip)     Time: 3151-7616 PT Time Calculation (min) (ACUTE ONLY): 23 min  Charges:  $Gait Training: 8-22 mins $Therapeutic Exercise: 8-22 mins                    G Codes:      Lyndel Safe Huprich PT, DPT   Huprich,Jason 10/30/2015, 12:37 PM

## 2015-10-30 NOTE — Progress Notes (Signed)
Physical Therapy Treatment Patient Details Name: Kyle Whitaker MRN: [id number] DOB: 31-Aug-1930 Today's Date: 10/30/2015    History of Present Illness Pt underwent L THR anterior approach without reported operative complications. He is POD#0 at time of evaluation. Pt is somewhat difficulty to understand at times but is AOx4 at time of evaluation. Pt denies falls in the last 12 months. He reports use of rollator for ambulation prior to admission.    PT Comments    Pt demonstrates slight increase in ambulation distance although he becomes fatigued with worsening LE buckling and strength. Gait is very slow with patient requiring assist initially to advance LLE. However pt reports improving pain with bed exercises and ambulation. He continues to demonstrate poor L hip flexion strength requiring assist for SLR in supine. Pt able to complete all bed exercises with assistance. Pt will benefit from skilled PT services to address deficits in strength, balance, and mobility in order to return to full function at home.    Follow Up Recommendations  SNF (Pt unsure if he would be willing to go)     Equipment Recommendations  None recommended by PT    Recommendations for Other Services       Precautions / Restrictions Precautions Precautions: Anterior Hip;Fall Precaution Booklet Issued: Yes (comment) (Per PT) Restrictions Weight Bearing Restrictions: Yes LLE Weight Bearing: Weight bearing as tolerated    Mobility  Bed Mobility Overal bed mobility: Needs Assistance Bed Mobility: Supine to Sit;Sit to Supine     Supine to sit: Mod assist;HOB elevated;+2 for safety/equipment Sit to supine: Mod assist;HOB elevated   General bed mobility comments: Poor LLE abduction strength and poor L hip flexion strength. Cues for sequencing. Pt with good UE strength to pull himself up toward [facility]  Transfers Overall transfer level: Needs assistance Equipment used: Rolling walker (2 wheeled) Transfers: Sit  to/from Stand Sit to Stand: Mod assist;+2 physical assistance         General transfer comment: Pt with decreased hip and knee extension strength. Cues for hand placement and sequencing as well as posture once upright in standing. Once in standing decreased to minA+2 with ability to remain upright. Posture listing noted with poor ability to self-correct  Ambulation/Gait Ambulation/Gait assistance: Min assist;+2 physical assistance Ambulation Distance (Feet): 10 Feet Assistive device: Rolling walker (2 wheeled) Gait Pattern/deviations: Step-through pattern;Decreased step length - right;Decreased step length - left;Antalgic Gait velocity: Decreased Gait velocity interpretation: <1.8 ft/sec, indicative of risk for recurrent falls General Gait Details: Pt continues with significant crouched posture but per MD this is relatively close to baseline. However as ambulation distance progresses he present with more knee and hip flexion. Pt provided cues for sequencing and initially requiring assist to advance LLE. Assist and cues for turning to return to bed. Pt unsafe for further ambulation at this time due to fatigue and worsening LE strength   Stairs            Wheelchair Mobility    Modified Rankin (Stroke Patients Only)       Balance Overall balance assessment: Needs assistance   Sitting balance-Leahy Scale: Fair       Standing balance-Leahy Scale: Poor                      Cognition Arousal/Alertness: Awake/alert Behavior During Therapy: WFL for tasks assessed/performed Overall Cognitive Status: Within Functional Limits for tasks assessed  Exercises General Exercises - Lower Extremity Ankle Circles/Pumps: Strengthening;Both;Supine;15 reps Quad Sets: Strengthening;Both;Supine;15 reps Heel Slides: Strengthening;Supine;Left;15 reps Hip ABduction/ADduction: Strengthening;15 reps;Seated;Left Straight Leg Raises: Strengthening;Supine;15  reps;Left    General Comments        Pertinent Vitals/Pain Pain Assessment: 0-10 Pain Score: 6  Pain Location: L hip Pain Intervention(s): Monitored during session    Home Living                      Prior Function            PT Goals (current goals can now be found in the care plan section) Acute Rehab PT Goals Patient Stated Goal: Would rather go home PT Goal Formulation: With patient Time For Goal Achievement: 11/12/15 Potential to Achieve Goals: Fair Progress towards PT goals: Progressing toward goals    Frequency  BID    PT Plan Current plan remains appropriate    Co-evaluation             End of Session Equipment Utilized During Treatment: Gait belt Activity Tolerance: Patient tolerated treatment well Patient left: with call bell/phone within reach;with SCD's reapplied;in bed;with bed alarm set (pillow under feet)     Time: 7782-4235 PT Time Calculation (min) (ACUTE ONLY): 26 min  Charges:  $Gait Training: 8-22 mins $Therapeutic Exercise: 8-22 mins                    G Codes:      [name], DPT   [name] 10/30/2015, 4:56 PM

## 2015-10-30 NOTE — Clinical Social Work Note (Signed)
Clinical Social Work Assessment  Patient Details  Name: Kyle Whitaker MRN: [id number] Date of Birth: [birth year]  Date of referral:  10/30/15               Reason for consult:  Facility Placement                Permission sought to share information with:  Chartered certified accountant granted to share information::  Yes, Verbal Permission Granted  Name::      Benoit::   [place]   Relationship::     Contact Information:     Housing/Transportation Living arrangements for the past 2 months:  [employer] of Information:  Patient Patient Interpreter Needed:  None Criminal Activity/Legal Involvement Pertinent to Current Situation/Hospitalization:  No - Comment as needed Significant Relationships:  Adult Children Lives with:  Adult Children Do you feel safe going back to the place where you live?  Yes Need for family participation in patient care:  Yes (Comment)  Care giving concerns: Patient lives in [place] with his son [name], daughter in law, and granddaughter.    Social Worker assessment / plan: Holiday representative (CSW) received SNF consult. PT is recommending SNF. CSW met with patient alone at bedside. Patient was alert and oriented and laying in the bed. CSW introduced self and explained role of CSW department. Patient reported that he lives in [place] with his son [name], daughter in law and 79 y.o granddaughter. CSW explained that PT is recommending SNF. Patient reported that he is agreeable to SNF search in [facility] and prefers [facility]   FL2 complete. CSW will continue to follow and assist as needed.   Employment status:  Retired Forensic scientist:  Information systems manager, Medicaid In [place] PT Recommendations:  [place] / Referral to community resources:  [place]  Patient/Family's Response to care: Patient is agreeable to [facility] and prefers [facility]    Patient/Family's Understanding of and Emotional Response to Diagnosis, Current Treatment, and Prognosis: Patient was pleasant throughout assessment and thanked CSW for visit.   Emotional Assessment Appearance:  Appears stated age Attitude/Demeanor/Rapport:    Affect (typically observed):  Accepting, Adaptable, Pleasant Orientation:  Oriented to Self, Oriented to Place, Oriented to  Time, Oriented to Situation Alcohol / Substance use:  Not Applicable Psych involvement (Current and /or in the community):  No (Comment)  Discharge Needs  Concerns to be addressed:  Discharge Planning Concerns Readmission within the last 30 days:  No Current discharge risk:  None Barriers to Discharge:  Continued Medical Work up   Loralyn Freshwater, LCSW 10/30/2015, 11:23 AM

## 2015-10-30 NOTE — Evaluation (Signed)
Occupational Therapy Evaluation Patient Details Name: Kyle Whitaker MRN: [id number] DOB: 1930/12/24 Today's Date: 10/30/2015    History of Present Illness This patient is an 81 year old male who came to [facility] for a L THR (anterior)   Clinical Impression   This patient is an 81 year old male who came to [facility] for a L total hip replacement (anterior approach) .  Patient lives in a 1 story home with his son and daughter in [place].  He had been independent with dressing and got some help with bathing.  He now requires  assistance for lower body dressing and would benefit from Occupational Therapy for ADL/functional mobility training while .staying within hip precautions (anterior approach) .      Follow Up Recommendations       Equipment Recommendations       Recommendations for Other Services       Precautions / Restrictions Precautions Precautions: Anterior Hip;Fall Precaution Booklet Issued: Yes (comment) (Per PT) Restrictions Weight Bearing Restrictions: Yes LLE Weight Bearing: Weight bearing as tolerated      Mobility Bed Mobility                  Transfers                      Balance                                            ADL                                         General ADL Comments: Patient had been independent with dressing prior to sugery. He now requires assist. Reviewed techniques for lower body dressing using hip kit. Instructed patient that he may not need hip kit in a few days as he had an (anterior approach).     Vision     Perception     Praxis      Pertinent Vitals/Pain       Hand Dominance Right   Extremity/Trunk Assessment Upper Extremity Assessment Upper Extremity Assessment: Overall WFL for tasks assessed   Lower Extremity Assessment Lower Extremity Assessment: Defer to PT evaluation       Communication     Cognition Arousal/Alertness:  Awake/alert Behavior During Therapy: WFL for tasks assessed/performed Overall Cognitive Status: Within Functional Limits for tasks assessed                     General Comments       Exercises       Shoulder Instructions      Home Living Family/patient expects to be discharged to:: Private residence Living Arrangements: Children Available Help at Discharge: Family Type of Home: House Home Access: Stairs to enter Technical brewer of Steps: 1 Entrance Stairs-Rails: Can reach both Home Layout: One level     Bathroom Shower/Tub: International aid/development worker Accessibility: Yes   Home Equipment: Environmental consultant - 2 wheels;Walker - 4 wheels;Shower seat;Wheelchair - manual;Grab bars - tub/shower          Prior Functioning/Environment Level of Independence: Independent with assistive device(s)        Comments: reports getting some assist with bathing from son  OT Diagnosis:     OT Problem List:     OT Treatment/Interventions:      OT Goals(Current goals can be found in the care plan section)    OT Frequency:     Barriers to D/C:            Co-evaluation              End of Session    Activity Tolerance:   Patient left:  in bed with bed alarm on buzzer with in reach.   Time:  -    Charges:    G-Codes:    Myrene Galas, MS/OTR/L   10/30/2015, 10:55 AM

## 2015-10-30 NOTE — Progress Notes (Signed)
Clinical Social Worker (CSW) met with patient and presented bed offers. Patient chose [facility]. Patient reported that he would like to discuss D/C plan with his son before making a final decision. CSW contacted patient's son Benny and made him aware of above. Per son he would prefer to take patient home. Son is coming to [facility] this evening to discuss D/C plan with patient. CSW will continue to follow and assist as needed.    [place], LCSWA [phone] 

## 2015-10-30 NOTE — NC FL2 (Signed)
[place] LEVEL OF CARE SCREENING TOOL     IDENTIFICATION  Patient Name: Kyle Whitaker Birthdate: 1930/05/12 Sex: male Admission Date (Current Location): 10/29/2015  [facility] [place] Number:  ([place] )  ([phone] [id number]) Facility and Address:  [facility], [address], [place], [place] [address]      Provider Number: [id number] [phone])  Attending Physician Name and Address:  Hessie Knows, MD  Relative Name and Phone Number:       Current Level of Care: Hospital Recommended Level of Care: [place] Prior Approval Number:    Date Approved/Denied:   PASRR Number:  [id number])  Discharge Plan: SNF    Current Diagnoses: Patient Active Problem List   Diagnosis Date Noted  . Primary osteoarthritis of left hip 10/29/2015  Heart Attack Hyperlipidemia Hypertension Inguinal hernia Prostate Cancer Spasticity  Orientation ACTIVITIES/SOCIAL BLADDER RESPIRATION    Self, Time, Situation, Place  Active Continent Normal  BEHAVIORAL SYMPTOMS/MOOD NEUROLOGICAL BOWEL NUTRITION STATUS   (none)  (none) Continent Diet (Regular )  PHYSICIAN VISITS COMMUNICATION OF NEEDS Height & Weight Skin  30 days Verbally 5\' 8"  (172.7 cm) 210 lbs. Surgical wounds (Incision: Left Hip. )          AMBULATORY STATUS RESPIRATION    Assist extensive Normal      Personal Care Assistance Level of Assistance  Bathing, Feeding, Dressing Bathing Assistance: Limited assistance Feeding assistance: Independent Dressing Assistance: Limited assistance      Functional Limitations Info  Sight, Hearing, Speech Sight Info: Adequate Hearing Info: Adequate Speech Info: Adequate       SPECIAL CARE FACTORS FREQUENCY  PT (By licensed PT), OT (By licensed OT)     PT Frequency:  (5) OT Frequency:  (5)           Additional Factors Info  Code Status, Allergies Code Status Info:  (Full Code. ) Allergies Info:  (Penicillins. )           Current Medications (10/30/2015): Current Facility-Administered Medications  Medication Dose Route Frequency Provider Last Rate Last Dose  . 0.9 %  sodium chloride infusion   Intravenous Continuous Hessie Knows, MD 100 mL/hr at 10/30/15 1018    . acetaminophen (TYLENOL) tablet 650 mg  650 mg Oral Q6H PRN Hessie Knows, MD       Or  . acetaminophen (TYLENOL) suppository 650 mg  650 mg Rectal Q6H PRN Hessie Knows, MD      . alum & mag hydroxide-simeth (MAALOX/MYLANTA) 200-200-20 MG/5ML suspension 30 mL  30 mL Oral Q4H PRN Hessie Knows, MD      . atenolol (TENORMIN) tablet 25 mg  25 mg Oral Daily Hessie Knows, MD   25 mg at 10/30/15 0857  . bisacodyl (DULCOLAX) suppository 10 mg  10 mg Rectal Daily PRN Hessie Knows, MD      . diazepam (VALIUM) tablet 5 mg  5 mg Oral TID PRN Hessie Knows, MD   5 mg at 10/30/15 0332  . docusate sodium (COLACE) capsule 100 mg  100 mg Oral BID Hessie Knows, MD   100 mg at 10/30/15 0857  . enoxaparin (LOVENOX) injection 40 mg  40 mg Subcutaneous Q24H Hessie Knows, MD   40 mg at 10/30/15 0857  . lisinopril (PRINIVIL,ZESTRIL) tablet 20 mg  20 mg Oral Daily Hessie Knows, MD   20 mg at 10/30/15 0857   And  . hydrochlorothiazide (HYDRODIURIL) tablet 25 mg  25 mg Oral Daily Hessie Knows, MD   [phone]  mg at 10/30/15 0857  . magnesium citrate solution 1 Bottle  1 Bottle Oral Once PRN Hessie Knows, MD      . magnesium hydroxide (MILK OF MAGNESIA) suspension 30 mL  30 mL Oral Daily PRN Hessie Knows, MD      . menthol-cetylpyridinium (CEPACOL) lozenge 3 mg  1 lozenge Oral PRN Hessie Knows, MD       Or  . phenol (CHLORASEPTIC) mouth spray 1 spray  1 spray Mouth/Throat PRN Hessie Knows, MD      . methocarbamol (ROBAXIN) tablet 500 mg  500 mg Oral Q6H PRN Hessie Knows, MD      . metoCLOPramide (REGLAN) tablet 5-10 mg  5-10 mg Oral Q8H PRN Hessie Knows, MD       Or  . metoCLOPramide (REGLAN) injection 5-10 mg  5-10 mg Intravenous Q8H PRN Hessie Knows, MD      . morphine 2 MG/ML  injection 2 mg  2 mg Intravenous Q1H PRN Hessie Knows, MD      . ondansetron [facility]) tablet 4 mg  4 mg Oral Q6H PRN Hessie Knows, MD       Or  . ondansetron [facility]) injection 4 mg  4 mg Intravenous Q6H PRN Hessie Knows, MD      . oxyCODONE (Oxy IR/ROXICODONE) immediate release tablet 5-10 mg  5-10 mg Oral Q3H PRN Hessie Knows, MD   5 mg at 10/30/15 0447  . white petrolatum (VASELINE) gel   Topical BID Hessie Knows, MD       Do not use this list as official medication orders. Please verify with discharge summary.  Discharge Medications:   Medication List    ASK your doctor about these medications        atenolol 25 MG tablet  Commonly known as:  TENORMIN  Take 25 mg by mouth daily.     diazepam 5 MG tablet  Commonly known as:  VALIUM  Take 5 mg by mouth 3 (three) times daily as needed for anxiety.     lisinopril-hydrochlorothiazide 20-25 MG tablet  Commonly known as:  PRINZIDE,ZESTORETIC  Take 1 tablet by mouth daily.        Relevant Imaging Results:  Relevant Lab Results:  Recent Labs    Additional Information  (SSN: [id number])  Loralyn Freshwater, LCSW

## 2015-10-30 NOTE — Progress Notes (Signed)
   Subjective: 1 Day Post-Op Procedure(s) (LRB): TOTAL HIP ARTHROPLASTY ANTERIOR APPROACH (Left) Patient reports pain as mild.   Patient is well, and has had no acute complaints or problems We will start therapy today.  Plan is to go Rehab after hospital stay.  Objective: Vital signs in last 24 hours: Temp:  [95.7 F (35.4 C)-98.9 F (37.2 C)] 98.3 F (36.8 C) (11/02 0330) Pulse Rate:  [52-99] 99 (11/02 0330) Resp:  [10-18] 18 (11/02 0330) BP: (111-179)/(53-94) 132/73 mmHg (11/02 0330) SpO2:  [98 %-100 %] 98 % (11/02 0330)  Intake/Output from previous day: 11/01 0701 - 11/02 0700 In: 1600 [I.V.:1600] Out: 1695 [Urine:1195; Blood:500] Intake/Output this shift:     Recent Labs  10/29/15 0654  HGB 13.1    Recent Labs  10/29/15 0654  WBC 8.7  RBC 4.27*  HCT 39.0*  PLT 274    Recent Labs  10/29/15 0654  CREATININE 1.47*   No results for input(s): LABPT, INR in the last 72 hours.  EXAM General - Patient is Alert, Appropriate and Oriented Extremity - Neurovascular intact Sensation intact distally Intact pulses distally Dorsiflexion/Plantar flexion intact Dressing - dressing C/D/I and no drainage Motor Function - intact, moving foot and toes well on exam.   Past Medical History  Diagnosis Date  . Hypertension   . Anxiety   . Arthritis   . Cancer [facility]) 2005    Prostate  . CHF (congestive heart failure) ([place])   . Shortness of breath dyspnea     on exertion    Assessment/Plan:   1 Day Post-Op Procedure(s) (LRB): TOTAL HIP ARTHROPLASTY ANTERIOR APPROACH (Left) Active Problems:   Primary osteoarthritis of left hip  Estimated body mass index is 31.94 kg/(m^2) as calculated from the following:   Height as of this encounter: 5\' 8"  (1.727 m).   Weight as of this encounter: 95.255 kg (210 lb). Advance diet Up with therapy  Needs BM Recheck labs in the am  DVT Prophylaxis - Lovenox, Foot Pumps and TED hose Weight-Bearing as tolerated to left leg D/C  O2 and Pulse OX and try on Room Air  T. Rachelle Hora, PA-C [place] 10/30/2015, 8:01 AM

## 2015-10-30 NOTE — Anesthesia Postprocedure Evaluation (Signed)
  Anesthesia Post-op Note  Patient: Kyle Whitaker  Procedure(s) Performed: Procedure(s): TOTAL HIP ARTHROPLASTY ANTERIOR APPROACH (Left)  Anesthesia type:Spinal  Patient location: PACU  Post pain: Pain level controlled  Post assessment: Post-op Vital signs reviewed, Patient's Cardiovascular Status Stable, Respiratory Function Stable, Patent Airway and No signs of Nausea or vomiting  Post vital signs: Reviewed and stable  Last Vitals:  Filed Vitals:   10/30/15 0330  BP: 132/73  Pulse: 99  Temp: 36.8 C  Resp: 18    Level of consciousness: awake, alert  and patient cooperative  Complications: No apparent anesthesia complications

## 2015-10-30 NOTE — Progress Notes (Signed)
Pt uses vaseline 2 times daily for feet.  MD Rudene Christians notified, per MD ok to place order.  Pt also refusing foot pump and TEDS, stating "it causes him too much pain and hurts his feet". New orders received for SCD's. Will continue to monitor

## 2015-10-30 NOTE — Clinical Social Work Placement (Signed)
   CLINICAL SOCIAL WORK PLACEMENT  NOTE  Date:  10/30/2015  Patient Details  Name: Kyle Whitaker MRN: [id number] Date of Birth: August 03, 1930  Clinical Social Work is seeking post-discharge placement for this patient at the JAARS level of care (*CSW will initial, date and re-position this form in  chart as items are completed):  Yes   Patient/family provided with [place] Work Department's list of facilities offering this level of care within the geographic area requested by the patient (or if unable, by the patient's family).  Yes   Patient/family informed of their freedom to choose among providers that offer the needed level of care, that participate in Medicare, Medicaid or managed care program needed by the patient, have an available bed and are willing to accept the patient.  Yes   Patient/family informed of [facility]'s ownership interest in [facility] and [facility], as well as of the fact that they are under no obligation to receive care at these facilities.  PASRR submitted to EDS on       PASRR number received on       Existing PASRR number confirmed on 10/30/15     FL2 transmitted to all facilities in geographic area requested by pt/family on 10/30/15     FL2 transmitted to all facilities within larger geographic area on       Patient informed that his/her managed care company has contracts with or will negotiate with certain facilities, including the following:            Patient/family informed of bed offers received.  Patient chooses bed at       Physician recommends and patient chooses bed at      Patient to be transferred to   on  .  Patient to be transferred to facility by       Patient family notified on   of transfer.  Name of family member notified:        PHYSICIAN Please sign FL2     Additional Comment:    _______________________________________________ Loralyn Freshwater, LCSW 10/30/2015, 11:20  AM

## 2015-10-31 LAB — BASIC METABOLIC PANEL
Anion gap: 6 (ref 5–15)
BUN: 16 mg/dL (ref 6–20)
CHLORIDE: 102 mmol/L (ref 101–111)
CO2: 28 mmol/L (ref 22–32)
CREATININE: 1.36 mg/dL — AB (ref 0.61–1.24)
Calcium: 8.2 mg/dL — ABNORMAL LOW (ref 8.9–10.3)
GFR calc non Af Amer: 46 mL/min — ABNORMAL LOW (ref >60)
GFR, EST AFRICAN AMERICAN: 54 mL/min — AB (ref >60)
Glucose, Bld: 116 mg/dL — ABNORMAL HIGH (ref 65–99)
Potassium: 3.6 mmol/L (ref 3.5–5.1)
SODIUM: 136 mmol/L (ref 135–145)

## 2015-10-31 LAB — CBC
HCT: 34 % — ABNORMAL LOW (ref 40.0–52.0)
HEMOGLOBIN: 11.5 g/dL — AB (ref 13.0–18.0)
MCH: 30.8 pg (ref 26.0–34.0)
MCHC: 33.9 g/dL (ref 32.0–36.0)
MCV: 90.7 fL (ref 80.0–100.0)
Platelets: 200 10*3/uL (ref 150–440)
RBC: 3.75 MIL/uL — ABNORMAL LOW (ref 4.40–5.90)
RDW: 14.4 % (ref 11.5–14.5)
WBC: 13.7 10*3/uL — ABNORMAL HIGH (ref 3.8–10.6)

## 2015-10-31 LAB — SURGICAL PATHOLOGY

## 2015-10-31 NOTE — Progress Notes (Signed)
Physical Therapy Treatment Patient Details Name: Kyle Whitaker MRN: [id number] DOB: 21-Jun-1930 Today's Date: 10/31/2015    History of Present Illness Pt underwent L THR anterior approach without reported operative complications. He is POD#0 at time of evaluation. Pt is somewhat difficulty to understand at times but is AOx4 at time of evaluation. Pt denies falls in the last 12 months. He reports use of rollator for ambulation prior to admission.    PT Comments    Pt returned back to bed already and did not wish up in the chair this pm. Pt agreeable to bed exercises. Pain increased this pm; pt notes receiving medication. Demonstrates good active assisted range of motion left hip, fair quad set and glut set on left and active range of motion left knee. Continue PT to progress strength and active range of motion to allow for improved functional mobility.   Follow Up Recommendations  SNF     Equipment Recommendations  None recommended by PT    Recommendations for Other Services       Precautions / Restrictions Precautions Precautions: Anterior Hip;Fall Restrictions Weight Bearing Restrictions: Yes LLE Weight Bearing: Weight bearing as tolerated    Mobility  Bed Mobility Overal bed mobility: Needs Assistance Bed Mobility: Supine to Sit     Supine to sit: Mod assist;HOB elevated     General bed mobility comments: Repositioned pt in bed for improved comfort Mod A  Transfers Overall transfer level: Needs assistance Equipment used: Rolling walker (2 wheeled) Transfers: Sit to/from Stand Sit to Stand: Mod assist (cues for activation of hip/back extensors, safe hands )         General transfer comment: Pt requires cues for sequence for hand placement, more equal weight shift between LEs, and activation of hip/back extensors for improved stand/upright posture  Ambulation/Gait Ambulation/Gait assistance: Min assist (for balance; especially due to signirficant forward/R  lean) Ambulation Distance (Feet): 4 Feet Assistive device: Rolling walker (2 wheeled) Gait Pattern/deviations: Step-to pattern;Decreased step length - right;Decreased step length - left;Decreased stance time - left;Decreased dorsiflexion - right;Decreased dorsiflexion - left;Decreased weight shift to left;Wide base of support;Trunk flexed Gait velocity: Decreased Gait velocity interpretation: <1.8 ft/sec, indicative of risk for recurrent falls General Gait Details: Significant forward flexed posture subjectively a little more than baseline. Decreased weight shift to L; antalgic sounds, but pt states L hip not too painful. Effortful, but not improved steadiness with ambulation to chair.    Stairs            Wheelchair Mobility    Modified Rankin (Stroke Patients Only)       Balance Overall balance assessment: Needs assistance   Sitting balance-Leahy Scale: Fair (R lateral lean)   Postural control: Right lateral lean Standing balance support: Bilateral upper extremity supported Standing balance-Leahy Scale: Poor                      Cognition Arousal/Alertness: Awake/alert Behavior During Therapy: WFL for tasks assessed/performed Overall Cognitive Status: Within Functional Limits for tasks assessed                      Exercises Total Joint Exercises Ankle Circles/Pumps: AROM;Both;20 reps;Supine Quad Sets: Strengthening;Both;20 reps;Supine Gluteal Sets: Strengthening;Both;20 reps;Supine Towel Squeeze: Strengthening;Both;20 reps;Supine Short Arc Quad: AROM;Both;20 reps;Supine Heel Slides: AAROM;Left;20 reps;Supine (AROM R) Hip ABduction/ADduction: Both;20 reps;AAROM;Supine Straight Leg Raises: AAROM;Left;20 reps;Supine (AROM R) Long Arc Quad: AAROM;Left;20 reps;Seated (AROM R) Marching in Standing: AAROM;Left;20 reps;Seated (AROM R)  General Comments        Pertinent Vitals/Pain Pain Assessment: 0-10 Pain Score: 5  Pain Location: L hip Pain  Descriptors / Indicators: Aching Pain Intervention(s): Limited activity within patient's tolerance;Monitored during session;Premedicated before session;Repositioned    Home Living                      Prior Function            PT Goals (current goals can now be found in the care plan section) Progress towards PT goals: Progressing toward goals (slowly)    Frequency  BID    PT Plan Current plan remains appropriate    Co-evaluation             End of Session Equipment Utilized During Treatment: Gait belt Activity Tolerance: Patient tolerated treatment well;No increased pain Patient left: in bed;with call bell/phone within reach;with bed alarm set (did not wish leg pumps on )     Time: 3112-1624 PT Time Calculation (min) (ACUTE ONLY): 25 min  Charges:  $Gait Training: 8-22 mins $Therapeutic Exercise: 23-37 mins                    G Codes:      Charlaine Dalton 10/31/2015, 1:35 PM

## 2015-10-31 NOTE — Care Management Important Message (Signed)
Important Message  Patient Details  Name: Kyle Whitaker MRN: [id number] Date of Birth: February 28, 1930   Medicare Important Message Given:  Yes-second notification given    Marshell Garfinkel, RN 10/31/2015, 8:55 AM

## 2015-10-31 NOTE — Progress Notes (Signed)
Clinical Education officer, museum (CSW) met with patient to get choice between [facility] and going home. Patient requested CSW to call his son [name]. Per son patient will go to [facility]. Plan is for patient to D/[facility]. Kim admissions coordinator at [facility] is aware of above. CSW sill continue to follow and assist as needed.   Blima Rich, [place] [phone]

## 2015-10-31 NOTE — Progress Notes (Signed)
Physical Therapy Treatment Patient Details Name: Kyle Whitaker MRN: [id number] DOB: 1930-03-31 Today's Date: 10/31/2015    History of Present Illness Pt underwent L THR anterior approach without reported operative complications. He is POD#0 at time of evaluation. Pt is somewhat difficulty to understand at times but is AOx4 at time of evaluation. Pt denies falls in the last 12 months. He reports use of rollator for ambulation prior to admission.    PT Comments    Pt demonstrating slow progress with mobility, but able to manage STS transfer and short ambulation distance with less physical assist today. Continues to require cueing for safety and sequence with all mobility. Pt received up in chair and encouraged to stay up this am. Plan to see pt this pm for continued strengthening and mobility.   Follow Up Recommendations  SNF     Equipment Recommendations  None recommended by PT    Recommendations for Other Services       Precautions / Restrictions Precautions Precautions: Anterior Hip;Fall Restrictions Weight Bearing Restrictions: Yes LLE Weight Bearing: Weight bearing as tolerated    Mobility  Bed Mobility Overal bed mobility: Needs Assistance Bed Mobility: Supine to Sit     Supine to sit: Mod assist;HOB elevated     General bed mobility comments:  (Assist for BLEs and trunk; difficulty scooting to edge bed)  Transfers Overall transfer level: Needs assistance Equipment used: Rolling walker (2 wheeled) Transfers: Sit to/from Stand Sit to Stand: Mod assist (cues for activation of hip/back extensors, safe hands )         General transfer comment: Pt requires cues for sequence for hand placement, more equal weight shift between LEs, and activation of hip/back extensors for improved stand/upright posture  Ambulation/Gait Ambulation/Gait assistance: Min assist (for balance; especially due to signirficant forward/R lean) Ambulation Distance (Feet): 4 Feet Assistive  device: Rolling walker (2 wheeled) Gait Pattern/deviations: Step-to pattern;Decreased step length - right;Decreased step length - left;Decreased stance time - left;Decreased dorsiflexion - right;Decreased dorsiflexion - left;Decreased weight shift to left;Wide base of support;Trunk flexed Gait velocity: Decreased Gait velocity interpretation: <1.8 ft/sec, indicative of risk for recurrent falls General Gait Details: Significant forward flexed posture subjectively a little more than baseline. Decreased weight shift to L; antalgic sounds, but pt states L hip not too painful. Effortful, but not improved steadiness with ambulation to chair.    Stairs            Wheelchair Mobility    Modified Rankin (Stroke Patients Only)       Balance Overall balance assessment: Needs assistance   Sitting balance-Leahy Scale: Fair (R lateral lean)   Postural control: Right lateral lean Standing balance support: Bilateral upper extremity supported Standing balance-Leahy Scale: Poor                      Cognition Arousal/Alertness: Awake/alert Behavior During Therapy: WFL for tasks assessed/performed Overall Cognitive Status: Within Functional Limits for tasks assessed                      Exercises Total Joint Exercises Ankle Circles/Pumps: AROM;Both;20 reps (long sit) Quad Sets: Strengthening;Both;20 reps (long sit) Gluteal Sets: Strengthening;Both;20 reps (long sit) Towel Squeeze: Strengthening;Both;20 reps (long sit) Hip ABduction/ADduction: AROM;Both;20 reps;Seated Long Arc Quad: AAROM;Left;20 reps;Seated (AROM R) Marching in Standing: AAROM;Left;20 reps;Seated (AROM R)    General Comments        Pertinent Vitals/Pain Pain Assessment:  (Reports L hip is more restless than painful)  Pain Location: L hip Pain Descriptors / Indicators: Restless Pain Intervention(s): Monitored during session;Premedicated before session;Repositioned    Home Living                       Prior Function            PT Goals (current goals can now be found in the care plan section) Progress towards PT goals: Progressing toward goals (slowly)    Frequency  BID    PT Plan Current plan remains appropriate    Co-evaluation             End of Session Equipment Utilized During Treatment: Gait belt Activity Tolerance: Patient tolerated treatment well;Patient limited by fatigue;No increased pain Patient left: in chair;with call bell/phone within reach;with chair alarm set;with SCD's reapplied     Time: 6579-0383 PT Time Calculation (min) (ACUTE ONLY): 31 min  Charges:  $Gait Training: 8-22 mins $Therapeutic Exercise: 8-22 mins                    G Codes:      Charlaine Dalton 10/31/2015, 10:39 AM

## 2015-10-31 NOTE — Progress Notes (Signed)
   Subjective: 2 Days Post-Op Procedure(s) (LRB): TOTAL HIP ARTHROPLASTY ANTERIOR APPROACH (Left) Patient reports pain as mild.   Patient is well, and has had no acute complaints or problems We will start therapy today.  Plan is to go Rehab after hospital stay.  Objective: Vital signs in last 24 hours: Temp:  [98 F (36.7 C)-99.3 F (37.4 C)] 98 F (36.7 C) (11/03 0420) Pulse Rate:  [84-86] 85 (11/03 0420) Resp:  [14-18] 18 (11/03 0420) BP: (116-148)/(50-64) 116/50 mmHg (11/03 0420) SpO2:  [95 %-100 %] 98 % (11/03 0420)  Intake/Output from previous day: 11/02 0701 - 11/03 0700 In: -  Out: 750 [Urine:750] Intake/Output this shift:     Recent Labs  10/29/15 0654 10/31/15 0605  HGB 13.1 11.5*    Recent Labs  10/29/15 0654 10/31/15 0605  WBC 8.7 13.7*  RBC 4.27* 3.75*  HCT 39.0* 34.0*  PLT 274 200    Recent Labs  10/29/15 0654 10/31/15 0605  NA  --  136  K  --  3.6  CL  --  102  CO2  --  28  BUN  --  16  CREATININE 1.47* 1.36*  GLUCOSE  --  116*  CALCIUM  --  8.2*   No results for input(s): LABPT, INR in the last 72 hours.  EXAM General - Patient is Alert, Appropriate and Oriented Extremity - Neurovascular intact Sensation intact distally Intact pulses distally Dorsiflexion/Plantar flexion intact Dressing - dressing C/D/I and scant drainage Motor Function - intact, moving foot and toes well on exam.   Past Medical History  Diagnosis Date  . Hypertension   . Anxiety   . Arthritis   . Cancer [facility]) 2005    Prostate  . CHF (congestive heart failure) ([place])   . Shortness of breath dyspnea     on exertion    Assessment/Plan:   2 Days Post-Op Procedure(s) (LRB): TOTAL HIP ARTHROPLASTY ANTERIOR APPROACH (Left) Active Problems:   Primary osteoarthritis of left hip   Acute post op blood loss anemia     Estimated body mass index is 31.94 kg/(m^2) as calculated from the following:   Height as of this encounter: 5\' 8"  (1.727 m).   Weight as of  this encounter: 95.255 kg (210 lb). Advance diet Up with therapy  Needs BM Plan on discharge to rehab facility tomorrow  DVT Prophylaxis - Lovenox, Foot Pumps and TED hose Weight-Bearing as tolerated to left leg D/C O2 and Pulse OX and try on Room Air  T. Rachelle Hora, PA-C [place] 10/31/2015, 8:01 AM

## 2015-11-01 ENCOUNTER — Encounter
Admission: RE | Admit: 2015-11-01 | Discharge: 2015-11-01 | Disposition: A | Discharge status: Home / Self Care | Payer: Medicare Other | Source: Ambulatory Visit | Attending: Internal Medicine | Admitting: Internal Medicine

## 2015-11-01 ENCOUNTER — Inpatient Hospital Stay: Payer: Medicare Other

## 2015-11-01 LAB — URINALYSIS COMPLETE WITH MICROSCOPIC (ARMC ONLY)
BILIRUBIN URINE: NEGATIVE
GLUCOSE, UA: NEGATIVE mg/dL
Ketones, ur: NEGATIVE mg/dL
LEUKOCYTES UA: NEGATIVE
Nitrite: NEGATIVE
PH: 5 (ref 5.0–8.0)
Protein, ur: 30 mg/dL — AB
SPECIFIC GRAVITY, URINE: 1.018 (ref 1.005–1.030)

## 2015-11-01 MED ORDER — ENOXAPARIN SODIUM 40 MG/0.4ML ~~LOC~~ SOLN: 40.0000 mg | SUBCUTANEOUS | Status: DC

## 2015-11-01 MED ORDER — DIAZEPAM 5 MG PO TABS: 5.0000 mg | ORAL_TABLET | Freq: Three times a day (TID) | ORAL | Status: DC | PRN

## 2015-11-01 MED ORDER — OXYCODONE HCL 5 MG PO TABS: 5.0000 mg | ORAL_TABLET | ORAL | Status: DC | PRN

## 2015-11-01 NOTE — Progress Notes (Addendum)
Patient being discharged to [facility] today. Report called to [facility]. Belongings packed; EMS called. VSS at time of discharge. Patient A&O x4, no c/o pain.

## 2015-11-01 NOTE — Discharge Instructions (Signed)

## 2015-11-01 NOTE — Progress Notes (Signed)
Physical Therapy Treatment Patient Details Name: Kyle Whitaker MRN: [id number] DOB: Jun 08, 1930 Today's Date: 11/01/2015    History of Present Illness L anterior total hip replacement 10/29/15    PT Comments    Pt is pleasant t/o the session and shows good effort, but is still relatively weak and limited.  His biggest deficits are with standing/walking and his inability to attain upright posture take appropriate steps.  Pt is very slow, guarded and forward flexed despite much cuing and assist.   Follow Up Recommendations  SNF     Equipment Recommendations  None recommended by PT    Recommendations for Other Services       Precautions / Restrictions Precautions Precautions: Anterior Hip;Fall Restrictions LLE Weight Bearing: Weight bearing as tolerated    Mobility  Bed Mobility Overal bed mobility: Needs Assistance Bed Mobility: Supine to Sit     Supine to sit: Min assist;Mod assist     General bed mobility comments: Pt shows good effort trying to get to EOB w/o assist, but does end up needing some  Transfers Overall transfer level: Needs assistance Equipment used: Rolling walker (2 wheeled) Transfers: Sit to/from Stand Sit to Stand: Mod assist         General transfer comment: Pt is able to get to standing, but even with cues has considerable forward flexed posture and lack of extension at knees, hips, trunk  Ambulation/Gait Ambulation/Gait assistance: Mod assist Ambulation Distance (Feet): 6 Feet Assistive device: Rolling walker (2 wheeled)       General Gait Details: Pt continues to have poor posture and inability to straighten up.  He has slow, guarded and unconfident ambulation.  He is heavily reliant on his UEs and needs much cuing.   Stairs            Wheelchair Mobility    Modified Rankin (Stroke Patients Only)       Balance                                    Cognition Arousal/Alertness: Awake/alert Behavior During  Therapy: WFL for tasks assessed/performed Overall Cognitive Status: Within Functional Limits for tasks assessed                      Exercises Total Joint Exercises Ankle Circles/Pumps: AROM;Both;20 reps;Supine Quad Sets: Strengthening;10 reps Gluteal Sets: Strengthening;10 reps Short Arc Quad: 10 reps;AROM Heel Slides: 10 reps;AROM;Strengthening Hip ABduction/ADduction: 10 reps;Strengthening    General Comments        Pertinent Vitals/Pain Pain Score: 3     Home Living                      Prior Function            PT Goals (current goals can now be found in the care plan section) Progress towards PT goals: Progressing toward goals    Frequency  BID    PT Plan Current plan remains appropriate    Co-evaluation             End of Session Equipment Utilized During Treatment: Gait belt Activity Tolerance: Patient tolerated treatment well;No increased pain Patient left: with chair alarm set     Time: 8768-1157 PT Time Calculation (min) (ACUTE ONLY): 26 min  Charges:  $Gait Training: 8-22 mins $Therapeutic Exercise: 8-22 mins  G Codes:     Wayne Both, PT, DPT [phone]  Kreg Shropshire 11/01/2015, 11:24 AM

## 2015-11-01 NOTE — Progress Notes (Signed)
Patient is medically stable for D/C today. Per Kim admissions coordinator at [facility] patient is going to room 212. RN will call report at [phone] and arrange EMS for transport. Clinical Education officer, museum (CSW) sent D/C Summary and D/C packet to [employer]. Patient is aware of above. CSW contacted patient's son Kyle Whitaker and made him aware of above. Please reconsult if future social work needs arise. CSW signing off.   Blima Rich, [place] [phone]

## 2015-11-01 NOTE — Progress Notes (Signed)
   Subjective: 3 Days Post-Op Procedure(s) (LRB): TOTAL HIP ARTHROPLASTY ANTERIOR APPROACH (Left) Patient reports pain as mild.   Patient is well, and has had no acute complaints or problems We will continue therapy today.  Plan is to go Rehab after hospital stay. No CP/SOB/ABD pain  Objective: Vital signs in last 24 hours: Temp:  [98 F (36.7 C)-101.5 F (38.6 C)] 98.9 F (37.2 C) (11/04 0519) Pulse Rate:  [80-94] 91 (11/04 0357) Resp:  [18] 18 (11/04 0357) BP: (112-137)/(50-70) 137/60 mmHg (11/04 0357) SpO2:  [94 %-99 %] 94 % (11/04 0357)  Intake/Output from previous day: 11/03 0701 - 11/04 0700 In: 480 [P.O.:480] Out: 625 [Urine:625] Intake/Output this shift:     Recent Labs  10/31/15 0605  HGB 11.5*    Recent Labs  10/31/15 0605  WBC 13.7*  RBC 3.75*  HCT 34.0*  PLT 200    Recent Labs  10/31/15 0605  NA 136  K 3.6  CL 102  CO2 28  BUN 16  CREATININE 1.36*  GLUCOSE 116*  CALCIUM 8.2*   No results for input(s): LABPT, INR in the last 72 hours.  EXAM General - Patient is Alert, Appropriate and Oriented Extremity - Neurovascular intact Sensation intact distally Intact pulses distally Dorsiflexion/Plantar flexion intact Dressing - dressing C/D/I and scant drainage Motor Function - intact, moving foot and toes well on exam.   Past Medical History  Diagnosis Date  . Hypertension   . Anxiety   . Arthritis   . Cancer [facility]) 2005    Prostate  . CHF (congestive heart failure) ([place])   . Shortness of breath dyspnea     on exertion    Assessment/Plan:   3 Days Post-Op Procedure(s) (LRB): TOTAL HIP ARTHROPLASTY ANTERIOR APPROACH (Left) Active Problems:   Primary osteoarthritis of left hip   Acute post op blood loss anemia     Estimated body mass index is 31.94 kg/(m^2) as calculated from the following:   Height as of this encounter: 5\' 8"  (1.727 m).   Weight as of this encounter: 95.255 kg (210 lb). Advance diet Up with therapy  Needs  BM Plan on discharge to rehab facility CXR/UA, high fever this am. Encouraged incentive spirometry  DVT Prophylaxis - Lovenox, Foot Pumps and TED hose Weight-Bearing as tolerated to left leg D/C O2 and Pulse OX and try on Room Air  T. Rachelle Hora, PA-C [place] 11/01/2015, 7:55 AM

## 2015-11-01 NOTE — Clinical Social Work Placement (Signed)
   CLINICAL SOCIAL WORK PLACEMENT  NOTE  Date:  11/01/2015  Patient Details  Name: Kyle Whitaker MRN: [id number] Date of Birth: Jan 05, 1930  Clinical Social Work is seeking post-discharge placement for this patient at the [place] level of care (*CSW will initial, date and re-position this form in  chart as items are completed):  Yes   Patient/family provided with [facility] Department's list of facilities offering this level of care within the geographic area requested by the patient (or if unable, by the patient's family).  Yes   Patient/family informed of their freedom to choose among providers that offer the needed level of care, that participate in Medicare, Medicaid or managed care program needed by the patient, have an available bed and are willing to accept the patient.  Yes   Patient/family informed of [facility]'s ownership interest in [facility] and [facility], as well as of the fact that they are under no obligation to receive care at these facilities.  PASRR submitted to EDS on       PASRR number received on       Existing PASRR number confirmed on 10/30/15     FL2 transmitted to all facilities in geographic area requested by pt/family on 10/30/15     FL2 transmitted to all facilities within larger geographic area on       Patient informed that his/her managed care company has contracts with or will negotiate with certain facilities, including the following:        Yes   Patient/family informed of bed offers received.  Patient chooses bed at  [facility] )     Physician recommends and patient chooses bed at      Patient to be transferred to  [facility] ) on 11/01/15.  Patient to be transferred to facility by  [facility] )     Patient family notified on 11/01/15 of transfer.  Name of family member notified:   (Son Environmental consultant aware of D/C. )     PHYSICIAN       Additional Comment:     _______________________________________________ Loralyn Freshwater, LCSW 11/01/2015, 11:49 AM

## 2015-11-01 NOTE — Discharge Summary (Signed)
Physician Discharge Summary  Patient ID: Kyle Whitaker MRN: [id number] DOB/AGE: 79-Aug-1931 79 y.o.  Admit date: 10/29/2015 Discharge date: 11/01/2015  Admission Diagnoses:  OSTEOARTHRITIS   Discharge Diagnoses: Patient Active Problem List   Diagnosis Date Noted  . Primary osteoarthritis of left hip 10/29/2015    Past Medical History  Diagnosis Date  . Hypertension   . Anxiety   . Arthritis   . Cancer [facility]) 2005    Prostate  . CHF (congestive heart failure) ([place])   . Shortness of breath dyspnea     on exertion     Transfusion: none   Consultants (if any):    Discharged Condition: Improved  Hospital Course: Kyle Whitaker is an 81 y.o. male who was admitted 10/29/2015 with a diagnosis of left hip OA and went to the operating room on 10/29/2015 and underwent the above named procedures.    Surgeries: Procedure(s): TOTAL HIP ARTHROPLASTY ANTERIOR APPROACH on 10/29/2015 Patient tolerated the surgery well. Taken to PACU where she was stabilized and then transferred to the orthopedic floor.  Started on Lovenox 40 q 24 hrs. Foot pumps applied bilaterally at 80 mm. Heels elevated on bed with rolled towels. No evidence of DVT. Negative Homan. Physical therapy started on day #1 for gait training and transfer. OT started day #1 for ADL and assisted devices.  Patient's foley was d/c on day #1. Patient's IV  was d/c on day #2.  On post op day #3 patient was stable and ready for discharge to rehab facility. Patient with fever over night. Afebrile the remainder of today. CXR and UA negative.  Implants:Medacta AMIS 4 lateralized stem, S 28 mm head, 52 mm Mpact cup DM with liner  He was given perioperative antibiotics:  Anti-infectives    Start     Dose/Rate Route Frequency Ordered Stop   10/29/15 1100  clindamycin (CLEOCIN) IVPB 900 mg     900 mg 100 mL/hr over 30 Minutes Intravenous Every 6 hours 10/29/15 1057 10/29/15 2317   10/29/15 0615  clindamycin (CLEOCIN) IVPB 900 mg  Status:   Discontinued     900 mg 100 mL/hr over 30 Minutes Intravenous  Once 10/29/15 0605 10/29/15 1054   10/29/15 0552  clindamycin (CLEOCIN) 900 MG/50ML IVPB    Comments:  Slemenda, Debbie: cabinet override      10/29/15 0552 10/29/15 0817    .  He was given sequential compression devices, early ambulation, and lovenox for DVT prophylaxis.  He benefited maximally from the hospital stay and there were no complications.    Recent vital signs:  Filed Vitals:   11/01/15 0519  BP:   Pulse:   Temp: 98.9 F (37.2 C)  Resp:     Recent laboratory studies:  Lab Results  Component Value Date   HGB 11.5* 10/31/2015   HGB 13.1 10/29/2015   HGB 13.5 10/14/2015   Lab Results  Component Value Date   WBC 13.7* 10/31/2015   PLT 200 10/31/2015   Lab Results  Component Value Date   INR 1.10 10/14/2015   Lab Results  Component Value Date   NA 136 10/31/2015   K 3.6 10/31/2015   CL 102 10/31/2015   CO2 28 10/31/2015   BUN 16 10/31/2015   CREATININE 1.36* 10/31/2015   GLUCOSE 116* 10/31/2015    Discharge Medications:     Medication List    TAKE these medications        atenolol 25 MG tablet  Commonly known as:  TENORMIN  Take 25 mg by mouth daily.     diazepam 5 MG tablet  Commonly known as:  VALIUM  Take 5 mg by mouth 3 (three) times daily as needed for anxiety.     enoxaparin 40 MG/0.4ML injection  Commonly known as:  LOVENOX  Inject 0.4 mLs (40 mg total) into the skin daily.     lisinopril-hydrochlorothiazide 20-25 MG tablet  Commonly known as:  PRINZIDE,ZESTORETIC  Take 1 tablet by mouth daily.     oxyCODONE 5 MG immediate release tablet  Commonly known as:  Oxy IR/ROXICODONE  Take 1-2 tablets (5-10 mg total) by mouth every 3 (three) hours as needed for breakthrough pain.        Diagnostic Studies: Dg Hip Operative Unilat With Pelvis Left  10/29/2015  CLINICAL DATA:  Left hip replacement EXAM: OPERATIVE LEFT HIP (WITH PELVIS IF PERFORMED) 1 VIEWS TECHNIQUE:  Fluoroscopic spot image(s) were submitted for interpretation post-operatively. COMPARISON:  None. FINDINGS: Single intraoperative spot image demonstrates changes of left hip replacement. Normal AP alignment. No visible complicating feature. IMPRESSION: Left hip replacement as above. Electronically Signed   By: Rolm Baptise M.D.   On: 10/29/2015 09:42   Dg Hip Unilat W Or W/o Pelvis 2-3 Views Left  10/29/2015  CLINICAL DATA:  Postop left hip, pain. EXAM: DG HIP (WITH OR WITHOUT PELVIS) 2-3V LEFT COMPARISON:  None. FINDINGS: Changes of left hip replacement. Normal alignment. No hardware or bony complicating feature. IMPRESSION: Left hip replacement without complicating feature. Electronically Signed   By: Rolm Baptise M.D.   On: 10/29/2015 10:36    Disposition: Rehab        Follow-up Information    Follow up with [facility] .   Specialty:  [place] information:   [address] [place] [phone]      Follow up with MENZ,MICHAEL, MD In 2 weeks.   Specialty:  Orthopedic Surgery   Why:  For staple removal and skin check   Contact information:   [place] [address] [phone]        Signed: [place] 11/01/2015, 8:00 AM

## 2015-11-01 NOTE — Progress Notes (Signed)
Afebrile after being medicated for temp. Able to voice needs. No acute distress noted.

## 2015-11-09 LAB — URINALYSIS COMPLETE WITH MICROSCOPIC (ARMC ONLY)
BILIRUBIN URINE: NEGATIVE
Glucose, UA: NEGATIVE mg/dL
HGB URINE DIPSTICK: NEGATIVE
Ketones, ur: NEGATIVE mg/dL
LEUKOCYTES UA: NEGATIVE
Nitrite: NEGATIVE
PH: 8 (ref 5.0–8.0)
Protein, ur: NEGATIVE mg/dL
SQUAMOUS EPITHELIAL / LPF: NONE SEEN
Specific Gravity, Urine: 1.014 (ref 1.005–1.030)
WBC, UA: NONE SEEN WBC/hpf (ref 0–5)

## 2015-11-11 LAB — URINE CULTURE: Culture: >=100000

## 2015-11-12 LAB — URINALYSIS COMPLETE WITH MICROSCOPIC (ARMC ONLY)
BILIRUBIN URINE: NEGATIVE
GLUCOSE, UA: NEGATIVE mg/dL
Ketones, ur: NEGATIVE mg/dL
Nitrite: POSITIVE — AB
Protein, ur: NEGATIVE mg/dL
SPECIFIC GRAVITY, URINE: 1.017 (ref 1.005–1.030)
pH: 5 (ref 5.0–8.0)

## 2015-11-14 LAB — URINE CULTURE: Culture: >=100000

## 2015-11-17 ENCOUNTER — Emergency Department: Payer: Medicare Other

## 2015-11-17 ENCOUNTER — Emergency Department
Admission: EM | Admit: 2015-11-17 | Discharge: 2015-11-17 | Disposition: A | Discharge status: Home / Self Care | Payer: Medicare Other | Attending: Emergency Medicine | Admitting: Emergency Medicine

## 2015-11-17 ENCOUNTER — Encounter: Payer: Self-pay | Admitting: Emergency Medicine

## 2015-11-17 DIAGNOSIS — Z87891 Personal history of nicotine dependence: Secondary | ICD-10-CM | POA: Diagnosis not present

## 2015-11-17 DIAGNOSIS — M96842 Postprocedural seroma of a musculoskeletal structure following a musculoskeletal system procedure: Secondary | ICD-10-CM | POA: Diagnosis not present

## 2015-11-17 DIAGNOSIS — M96840 Postprocedural hematoma of a musculoskeletal structure following a musculoskeletal system procedure: Secondary | ICD-10-CM | POA: Insufficient documentation

## 2015-11-17 DIAGNOSIS — Z79899 Other long term (current) drug therapy: Secondary | ICD-10-CM | POA: Insufficient documentation

## 2015-11-17 DIAGNOSIS — S7002XA Contusion of left hip, initial encounter: Secondary | ICD-10-CM

## 2015-11-17 DIAGNOSIS — G8918 Other acute postprocedural pain: Secondary | ICD-10-CM | POA: Diagnosis present

## 2015-11-17 DIAGNOSIS — Z880 Allergy status to penicillin: Secondary | ICD-10-CM | POA: Diagnosis not present

## 2015-11-17 DIAGNOSIS — M25452 Effusion, left hip: Secondary | ICD-10-CM

## 2015-11-17 DIAGNOSIS — I10 Essential (primary) hypertension: Secondary | ICD-10-CM | POA: Insufficient documentation

## 2015-11-17 LAB — COMPREHENSIVE METABOLIC PANEL
ALBUMIN: 3.4 g/dL — AB (ref 3.5–5.0)
ALK PHOS: 83 U/L (ref 38–126)
ALT: 18 U/L (ref 17–63)
ANION GAP: 8 (ref 5–15)
AST: 28 U/L (ref 15–41)
BUN: 25 mg/dL — AB (ref 6–20)
CALCIUM: 9.1 mg/dL (ref 8.9–10.3)
CO2: 28 mmol/L (ref 22–32)
Chloride: 98 mmol/L — ABNORMAL LOW (ref 101–111)
Creatinine, Ser: 1.96 mg/dL — ABNORMAL HIGH (ref 0.61–1.24)
GFR calc Af Amer: 35 mL/min — ABNORMAL LOW (ref >60)
GFR calc non Af Amer: 30 mL/min — ABNORMAL LOW (ref >60)
GLUCOSE: 92 mg/dL (ref 65–99)
Potassium: 4 mmol/L (ref 3.5–5.1)
SODIUM: 134 mmol/L — AB (ref 135–145)
Total Bilirubin: 0.5 mg/dL (ref 0.3–1.2)
Total Protein: 7.2 g/dL (ref 6.5–8.1)

## 2015-11-17 LAB — CBC WITH DIFFERENTIAL/PLATELET
BASOS PCT: 0 %
Basophils Absolute: 0 10*3/uL (ref 0–0.1)
EOS PCT: 1 %
Eosinophils Absolute: 0.1 10*3/uL (ref 0–0.7)
HEMATOCRIT: 30.9 % — AB (ref 40.0–52.0)
Hemoglobin: 10.5 g/dL — ABNORMAL LOW (ref 13.0–18.0)
Lymphocytes Relative: 11 %
Lymphs Abs: 0.9 10*3/uL — ABNORMAL LOW (ref 1.0–3.6)
MCH: 30.6 pg (ref 26.0–34.0)
MCHC: 33.9 g/dL (ref 32.0–36.0)
MCV: 90.3 fL (ref 80.0–100.0)
MONO ABS: 1.5 10*3/uL — AB (ref 0.2–1.0)
MONOS PCT: 18 %
NEUTROS ABS: 5.9 10*3/uL (ref 1.4–6.5)
Neutrophils Relative %: 70 %
Platelets: 517 10*3/uL — ABNORMAL HIGH (ref 150–440)
RBC: 3.43 MIL/uL — ABNORMAL LOW (ref 4.40–5.90)
RDW: 14.1 % (ref 11.5–14.5)
WBC: 8.4 10*3/uL (ref 3.8–10.6)

## 2015-11-17 LAB — PROTIME-INR
INR: 1.14
PROTHROMBIN TIME: 14.8 s (ref 11.4–15.0)

## 2015-11-17 MED ORDER — ONDANSETRON HCL 4 MG/2ML IJ SOLN
4.0000 mg | Freq: Once | INTRAMUSCULAR | Status: AC
  Administered 2015-11-17: 4 mg via INTRAVENOUS
  Filled 2015-11-17: qty 2

## 2015-11-17 MED ORDER — MORPHINE SULFATE (PF) 2 MG/ML IV SOLN
2.0000 mg | Freq: Once | INTRAVENOUS | Status: AC
  Administered 2015-11-17: 2 mg via INTRAVENOUS
  Filled 2015-11-17: qty 1

## 2015-11-17 MED ORDER — SODIUM CHLORIDE 0.9 % IV BOLUS (SEPSIS)
500.0000 mL | Freq: Once | INTRAVENOUS | Status: AC
  Administered 2015-11-17: 500 mL via INTRAVENOUS

## 2015-11-17 NOTE — ED Notes (Signed)
Patient transported to Ultrasound 

## 2015-11-17 NOTE — ED Provider Notes (Signed)
[facility] Emergency Department Provider Note  ____________________________________________  Time seen: Approximately 5:38 AM  I have reviewed the triage vital signs and the nursing notes.   HISTORY  Chief Complaint Post-op Problem    HPI Kyle Whitaker is a 81 y.o. male who presents to the ED from rehabilitation facility via EMS with a chief complaint of left hip postoperative siteswelling and blood on bandage. Patient had left THA per Dr. Rudene Christians on November 1. Had staples removed last week. Nursing staff noted swelling to incision area with bleeding on bandage this morning and sent patient to the ED for further evaluation. Patient denies pain at postop site. He does complain of a burning sensation in his left calf and knee. States physical therapy had him up and walking yesterday. Denies fall or trauma. Denies fever, chills, chest pain, shortness of breath, abdominal pain, nausea, vomiting, diarrhea. Nursing staff reported temperature of 87F. Nothing makes his symptoms better or worse.   Past Medical History  Diagnosis Date  . Hypertension   . Anxiety   . Arthritis   . Cancer [facility]) 2005    Prostate  . CHF (congestive heart failure) ([place])   . Shortness of breath dyspnea     on exertion    Patient Active Problem List   Diagnosis Date Noted  . Primary osteoarthritis of left hip 10/29/2015    Past Surgical History  Procedure Laterality Date  . Joint replacement Right 2011    Total Hip Replacement, [facility]  . Joint replacement Right 2011    Total Knee Replacement, [facility]  . Knee arthroscopy Right 2011  . Prostate surgery    . Total hip arthroplasty Left 10/29/2015    Procedure: TOTAL HIP ARTHROPLASTY ANTERIOR APPROACH;  Surgeon: Hessie Knows, MD;  Location: [facility] ORS;  Service: Orthopedics;  Laterality: Left;    Current Outpatient Rx  Name  Route  Sig  Dispense  Refill  . atenolol (TENORMIN) 25 MG tablet   Oral   Take 25 mg by mouth daily.          . Cholecalciferol 4000 UNITS CAPS   Oral   Take 1 capsule by mouth daily.         . diazepam (VALIUM) 5 MG tablet   Oral   Take 1 tablet (5 mg total) by mouth 3 (three) times daily as needed for anxiety.   30 tablet   0   . hydrochlorothiazide (HYDRODIURIL) 25 MG tablet   Oral   Take 25 mg by mouth daily.         Marland Kitchen lisinopril (PRINIVIL,ZESTRIL) 20 MG tablet   Oral   Take 20 mg by mouth daily.         . magnesium hydroxide (MILK OF MAGNESIA) 400 MG/5ML suspension   Oral   Take 30 mLs by mouth every 4 (four) hours as needed for moderate constipation (no BM for 2 days).         Marland Kitchen oxyCODONE (OXY IR/ROXICODONE) 5 MG immediate release tablet   Oral   Take 1-2 tablets (5-10 mg total) by mouth every 3 (three) hours as needed for breakthrough pain.   30 tablet   0   . enoxaparin (LOVENOX) 40 MG/0.4ML injection   Subcutaneous   Inject 0.4 mLs (40 mg total) into the skin daily. Patient not taking: Reported on 11/17/2015   14 Syringe   0     Allergies Penicillins  History reviewed. No pertinent family history.  Social History Social History  Substance Use Topics  . Smoking status: Former Smoker -- 1.00 packs/day    Types: Cigarettes  . Smokeless tobacco: Never Used  . Alcohol Use: No    Review of Systems Constitutional: No fever/chills Eyes: No visual changes. ENT: No sore throat. Cardiovascular: Denies chest pain. Respiratory: Denies shortness of breath. Gastrointestinal: No abdominal pain.  No nausea, no vomiting.  No diarrhea.  No constipation. Genitourinary: Negative for dysuria. Musculoskeletal: Positive for left hip postoperative swelling and bloody discharge. Negative for back pain. Skin: Negative for rash. Neurological: Negative for headaches, focal weakness or numbness.  10-point ROS otherwise negative.  ____________________________________________   PHYSICAL EXAM:  VITAL SIGNS: ED Triage Vitals  Enc Vitals Group     BP 11/17/15 0525  131/49 mmHg     Pulse Rate 11/17/15 0525 83     Resp 11/17/15 0525 20     Temp 11/17/15 0525 98.7 F (37.1 C)     Temp Source 11/17/15 0525 Oral     SpO2 11/17/15 0525 100 %     Weight 11/17/15 0525 198 lb (89.812 kg)     Height 11/17/15 0525 5\' 9"  (1.753 m)     Head Cir --      Peak Flow --      Pain Score 11/17/15 0529 6     Pain Loc --      Pain Edu? --      Excl. in [place]? --     Constitutional: Alert and oriented. Well appearing and in no acute distress. Eyes: Conjunctivae are normal. PERRL. EOMI. Head: Atraumatic. Nose: No congestion/rhinnorhea. Mouth/Throat: Mucous membranes are moist.  Oropharynx non-erythematous. Neck: No stridor.   Cardiovascular: Normal rate, regular rhythm. Grossly normal heart sounds.  Good peripheral circulation. Respiratory: Normal respiratory effort.  No retractions. Lungs CTAB. Gastrointestinal: Soft and nontender. No distention. No abdominal bruits. No CVA tenderness. Musculoskeletal:  Left lower extremity: There is a palm size area of swelling along the incision site. Incision site has Steri-Strips which are intact. There is a bloody discharge on patient's bandage. Left calf is supple with mild tenderness. Left knee is nontender with full range of motion. 2+ femoral and distal pulses. Symmetrically warm limb without evidence for ischemia. Neurologic:  Normal speech and language. No gross focal neurologic deficits are appreciated. No gait instability. Skin:  Skin is warm, dry and intact. No rash noted. Psychiatric: Mood and affect are normal. Speech and behavior are normal.  ____________________________________________   LABS (all labs ordered are listed, but only abnormal results are displayed)  Labs Reviewed  COMPREHENSIVE METABOLIC PANEL - Abnormal; Notable for the following:    Sodium 134 (*)    Chloride 98 (*)    BUN 25 (*)    Creatinine, Ser 1.96 (*)    Albumin 3.4 (*)    GFR calc non Af Amer 30 (*)    GFR calc Af Amer 35 (*)    All  other components within normal limits  CBC WITH DIFFERENTIAL/PLATELET - Abnormal; Notable for the following:    RBC 3.43 (*)    Hemoglobin 10.5 (*)    HCT 30.9 (*)    Platelets 517 (*)    Lymphs Abs 0.9 (*)    Monocytes Absolute 1.5 (*)    All other components within normal limits  CULTURE, BLOOD (SINGLE)  CULTURE, BLOOD (ROUTINE X 2)  CULTURE, BLOOD (ROUTINE X 2)  PROTIME-INR   ____________________________________________  EKG  none ____________________________________________  RADIOLOGY  Doppler ultrasound interpreted per Dr. Marisue Humble: No  evidence of left lower extremity deep venous thrombosis. ____________________________________________   PROCEDURES  Procedure(s) performed: None  Critical Care performed: No  ____________________________________________   INITIAL IMPRESSION / ASSESSMENT AND PLAN / ED COURSE  Pertinent labs & imaging results that were available during my care of the patient were reviewed by me and considered in my medical decision making (see chart for details).  81 year old male who is approximately 20 days s/p left total hip arthroplasty who presents with swelling and bloody discharge at incision site. Differential diagnosis includes abscess, hematoma, seroma. Will obtain blood cultures, screening lab work and obtain CT imaging study of hip. Will also obtain Doppler ultrasound to evaluate for DVT given patient's complaint of left calf burning.  ----------------------------------------- 7:02 AM on 11/17/2015 -----------------------------------------  Patient in CT. Laboratory and ultrasound results noted. Care transferred to Dr. Reita Cliche pending results of CT scan. ____________________________________________   FINAL CLINICAL IMPRESSION(S) / ED DIAGNOSES  Final diagnoses:  Hip swelling, left      Paulette Blanch, MD 11/17/15 [phone]

## 2015-11-17 NOTE — ED Notes (Signed)
Patient presents to Emergency Department via EMS with complaints of  Swelling and pain to left hip s/p hip arthroplasty on 1 Nov, staples removed this week, RN at [facility] place noticed swelling and dried blood. Ice applied at facility.

## 2015-11-17 NOTE — ED Notes (Signed)
Patient transported to CT 

## 2015-11-17 NOTE — ED Provider Notes (Signed)
[facility]  I accepted care from Dr. Beather Arbour ____________________________________________     Kyle Whitaker were viewed by me. Imaging interpreted by radiologist.  CT left hip:  IMPRESSION: 1. Focal subcutaneous hematoma or seroma just deep to the incision at the anterior aspect of the left hip. 2. Left total hip arthroplasty demonstrates no complicating features.  ____________________________________________   PROCEDURES  Procedure(s) performed: None  Critical Care performed: None  ____________________________________________   INITIAL IMPRESSION / ASSESSMENT AND PLAN / ED COURSE  CONSULTATIONS: Orthopedics, Dr. Mack Guise and Dr. Rudene Christians  Pertinent labs & imaging results that were available during my care of the patient were reviewed by me and considered in my medical decision making (see chart for details).  I followed up on CT results, and CT shows subcutaneous seroma/hematoma. Discussed with on-call orthopedic doctor Mack Guise, and recommended to call pt's orthopedist Dr. Rudene Christians.  Dr. Rudene Christians not on call, but recommends patient be seen in office tomorrow morning for evaluation and likely aspiration of seroma/hematoma. No emergency need for aspiration in the emergency department.  Patient / Family / Caregiver informed of clinical course, medical decision-making process, and agree with plan.   I discussed return precautions, follow-up instructions, and discharged instructions with patient and/or family.  ____________________________________________   FINAL CLINICAL IMPRESSION(S) / ED DIAGNOSES  Final diagnoses:  Hip swelling, left  Hematoma of left hip, initial encounter     FOLLOW UP   Referred to: Dr. Rudene Christians, tomorrow   Lisa Roca, MD 11/17/15 [phone]

## 2015-11-17 NOTE — ED Notes (Signed)
Pt discharged with [facility] returning to [facility]. Facility notified of patient's return and discharge instructions sent with pt. NAD at this time.

## 2015-11-17 NOTE — Discharge Instructions (Signed)
You have a fluid collection called seroma/hematoma just underneath the skin. Change dressings as needed. Return to the emergency department for any worsening condition including any fever, skin rash, or any new or worsening swelling or pain.  Dr. Rudene Christians would like you to come to the office tomorrow for evaluation and likely aspiration of fluid.  Call office in the morning.  Seroma A seroma is a collection of fluid that looks like swelling or a mass on the body. Seromas form on the body where tissue has been injured or cut. They are most common after surgeries. Seromas vary in size. Some are small and painless. Others may become large and cause pain or discomfort. Many seromas go away on their own; the fluid is naturally absorbed by the body. Some may require the fluid to be drained through medical procedures.  CAUSES  Seromas form as the result of damage to tissue or the removal of tissue. This tissue damage may occur during surgery or because of an injury or trauma. When tissue is disrupted or removed, empty space is created. The body's natural defense system causes fluid to enter the empty space and form a seroma. SYMPTOMS   Swelling at the site of a surgical cut (incision) or an injury.  Drainage of clear fluid at the surgery or injury site.  Possible discomfort or pain. DIAGNOSIS  Your health care provider will perform a physical exam. During the exam, the health care provider will press on the seroma using a hand or fingers (palpation). Various tests may be ordered to help confirm the diagnosis. These tests may include:  Blood tests.  Imaging tests such as ultrasonography or computed tomography (CT). TREATMENT  Sometimes seromas resolve on their own and drain naturally in the body. Your health care provider may monitor you to make sure the seroma does not cause any complications. If your seroma does not resolve on its own, treatment may include:  Using a needle to drain the fluid from the  seroma (needle aspiration).  Inserting a flexible tube (catheter) to drain the fluid.  Applying a dressing, such as an elastic bandage or binder.  Use of antibiotic medicines if the seroma becomes infected.  In rare cases, surgery may be done to remove the seroma and repair the area. HOME CARE INSTRUCTIONS  Follow your health care provider's instructions regarding activity levels and any limitations on movements.  Only take over-the-counter or prescription medicines as directed by your health care provider.  If your health care provider prescribes antibiotics, take them as directed. Finish them even if you start to feel better.  Check your seroma every day for redness, warmth, or yellow drainage.  Follow up with your health care provider as directed. SEEK MEDICAL CARE IF:  You develop a fever.  You have pain, tenderness, redness, or warmth at the site of the seroma.  You notice yellow drainage coming from the site of the seroma.  Your seroma is getting bigger.   This information is not intended to replace advice given to you by your health care provider. Make sure you discuss any questions you have with your health care provider.   Document Released: 04/10/2013 Document Revised: 01/04/2015 Document Reviewed: 04/10/2013 Elsevier Interactive Patient Education [employer].

## 2015-11-22 LAB — CULTURE, BLOOD (ROUTINE X 2): CULTURE: NO GROWTH

## 2015-11-22 LAB — CULTURE, BLOOD (SINGLE): Culture: NO GROWTH

## 2016-06-11 ENCOUNTER — Emergency Department: Payer: Medicare Other

## 2016-06-11 ENCOUNTER — Encounter: Payer: Self-pay | Admitting: Emergency Medicine

## 2016-06-11 ENCOUNTER — Emergency Department
Admission: EM | Admit: 2016-06-11 | Discharge: 2016-06-11 | Disposition: A | Discharge status: Home / Self Care | Payer: Medicare Other | Attending: Emergency Medicine | Admitting: Emergency Medicine

## 2016-06-11 DIAGNOSIS — Z79899 Other long term (current) drug therapy: Secondary | ICD-10-CM | POA: Diagnosis not present

## 2016-06-11 DIAGNOSIS — I110 Hypertensive heart disease with heart failure: Secondary | ICD-10-CM | POA: Diagnosis not present

## 2016-06-11 DIAGNOSIS — R0989 Other specified symptoms and signs involving the circulatory and respiratory systems: Secondary | ICD-10-CM | POA: Diagnosis not present

## 2016-06-11 DIAGNOSIS — I6521 Occlusion and stenosis of right carotid artery: Secondary | ICD-10-CM | POA: Diagnosis not present

## 2016-06-11 DIAGNOSIS — Z96649 Presence of unspecified artificial hip joint: Secondary | ICD-10-CM | POA: Diagnosis not present

## 2016-06-11 DIAGNOSIS — I6523 Occlusion and stenosis of bilateral carotid arteries: Secondary | ICD-10-CM

## 2016-06-11 DIAGNOSIS — M1990 Unspecified osteoarthritis, unspecified site: Secondary | ICD-10-CM | POA: Insufficient documentation

## 2016-06-11 DIAGNOSIS — Z96659 Presence of unspecified artificial knee joint: Secondary | ICD-10-CM | POA: Insufficient documentation

## 2016-06-11 DIAGNOSIS — H9311 Tinnitus, right ear: Secondary | ICD-10-CM | POA: Diagnosis present

## 2016-06-11 DIAGNOSIS — I509 Heart failure, unspecified: Secondary | ICD-10-CM | POA: Diagnosis not present

## 2016-06-11 DIAGNOSIS — Z8546 Personal history of malignant neoplasm of prostate: Secondary | ICD-10-CM | POA: Insufficient documentation

## 2016-06-11 DIAGNOSIS — Z87891 Personal history of nicotine dependence: Secondary | ICD-10-CM | POA: Diagnosis not present

## 2016-06-11 LAB — CBC
HEMATOCRIT: 37.1 % — AB (ref 40.0–52.0)
Hemoglobin: 12.6 g/dL — ABNORMAL LOW (ref 13.0–18.0)
MCH: 30.3 pg (ref 26.0–34.0)
MCHC: 34 g/dL (ref 32.0–36.0)
MCV: 89.3 fL (ref 80.0–100.0)
Platelets: 244 10*3/uL (ref 150–440)
RBC: 4.16 MIL/uL — ABNORMAL LOW (ref 4.40–5.90)
RDW: 15.5 % — AB (ref 11.5–14.5)
WBC: 8.5 10*3/uL (ref 3.8–10.6)

## 2016-06-11 LAB — COMPREHENSIVE METABOLIC PANEL
ALBUMIN: 4.1 g/dL (ref 3.5–5.0)
ALT: 17 U/L (ref 17–63)
ANION GAP: 7 (ref 5–15)
AST: 34 U/L (ref 15–41)
Alkaline Phosphatase: 73 U/L (ref 38–126)
BILIRUBIN TOTAL: 0.5 mg/dL (ref 0.3–1.2)
BUN: 16 mg/dL (ref 6–20)
CO2: 28 mmol/L (ref 22–32)
Calcium: 9.3 mg/dL (ref 8.9–10.3)
Chloride: 104 mmol/L (ref 101–111)
Creatinine, Ser: 1.31 mg/dL — ABNORMAL HIGH (ref 0.61–1.24)
GFR, EST AFRICAN AMERICAN: 56 mL/min — AB (ref >60)
GFR, EST NON AFRICAN AMERICAN: 48 mL/min — AB (ref >60)
Glucose, Bld: 124 mg/dL — ABNORMAL HIGH (ref 65–99)
POTASSIUM: 3.5 mmol/L (ref 3.5–5.1)
Sodium: 139 mmol/L (ref 135–145)
TOTAL PROTEIN: 7 g/dL (ref 6.5–8.1)

## 2016-06-11 MED ORDER — IOPAMIDOL (ISOVUE-370) INJECTION 76%
75.0000 mL | Freq: Once | INTRAVENOUS | Status: AC | PRN
  Administered 2016-06-11: 75 mL via INTRAVENOUS

## 2016-06-11 NOTE — ED Notes (Signed)
Pt with humming in ears x3 weeks, sent over from next care for further eval.

## 2016-06-11 NOTE — ED Provider Notes (Signed)
Patient's CT of the neck shows the following: IMPRESSION: 1. No acute abnormality involving the major arterial vasculature of the neck. 2. Atheromatous plaque at the right carotid bifurcation/proximal right ICA with associated stenosis of up to 75-80% by NASCET criteria. This stenosis begins at the bifurcation, and extends for approximately 1 cm in length. 3. Focal plaque at the left carotid bifurcation with associated short-segment stenosis of approximately 50% by NASCET criteria. 4. Moderate atheromatous stenosis at the origin of the right vertebral artery, with multi focal atheromatous irregularity throughout the pre foraminal right V1 segment. Dominant right vertebral artery otherwise widely patent within the neck. 5. 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 There is patchy irregular filling distally within the left is V2 segment, likely via muscular branches. More regular patent flow within the left V3 and V4 segments. 6. Extensive degenerative spondylolysis throughout the cervical spine as above.  At this time, the patient's only symptom is a whooshing sensation that he hears. He has no neurologic deficits. He will need outpatient evaluation, and I'll give him the follow-up instructions for the vascular surgeon on-call.  Eula Listen, MD 06/11/16 2326

## 2016-06-11 NOTE — Discharge Instructions (Signed)
No certain cause was found, however your exam and evaluation are reassuring in the emergency department today.  Return to emergency department worsening condition including chest pain, palpitations, trouble breathing, weakness, numbness, dizziness or passing out, confusion or altered mental status, or any other symptoms concerning to you.   Tinnitus Tinnitus refers to hearing a sound when there is no actual source for that sound. This is often described as ringing in the ears. However, people with this condition may hear a variety of noises. A person may hear the sound in one ear or in both ears.  The sounds of tinnitus can be soft, loud, or somewhere in between. Tinnitus can last for a few seconds or can be constant for days. It may go away without treatment and come back at various times. When tinnitus is constant or happens often, it can lead to other problems, such as trouble sleeping and trouble concentrating. Almost everyone experiences tinnitus at some point. Tinnitus that is long-lasting (chronic) or comes back often is a problem that may require medical attention.  CAUSES  The cause of tinnitus is often not known. In some cases, it can result from other problems or conditions, including:   Exposure to loud noises from machinery, music, or other sources.  Hearing loss.  Ear or sinus infections.  Earwax buildup.  A foreign object in the ear.  Use of certain medicines.  Use of alcohol and caffeine.  High blood pressure.  Heart diseases.  Anemia.  Allergies.  Meniere disease.  Thyroid problems.  Tumors.  An enlarged part of a weakened blood vessel (aneurysm). SYMPTOMS The main symptom of tinnitus is hearing a sound when there is no source for that sound. It may sound like:   Buzzing.  Roaring.  Ringing.  Blowing air, similar to the sound heard when you listen to a seashell.  Hissing.  Whistling.  Sizzling.  Humming.  Running water.  A sustained  musical note. DIAGNOSIS  Tinnitus is diagnosed based on your symptoms. Your health care provider will do a physical exam. A comprehensive hearing exam (audiologic exam) will be done if your tinnitus:   Affects only one ear (unilateral).  Causes hearing difficulties.  Lasts 6 months or longer. You may also need to see a health care provider who specializes in hearing disorders (audiologist). You may be asked to complete a questionnaire to determine the severity of your tinnitus. Tests may be done to help determine the cause and to rule out other conditions. These can include:  Imaging studies of your head and brain, such as:  A CT scan.  An MRI.  An imaging study of your blood vessels (angiogram). TREATMENT  Treating an underlying medical condition can sometimes make tinnitus go away. If your tinnitus continues, other treatments may include:  Medicines, such as certain antidepressants or sleeping aids.  Sound generators to mask the tinnitus. These include:  Tabletop sound machines that play relaxing sounds to help you fall asleep.  Wearable devices that fit in your ear and play sounds or music.  A small device that uses headphones to deliver a signal embedded in music (acoustic neural stimulation). In time, this may change the pathways of your brain and make you less sensitive to tinnitus. This device is used for very severe cases when no other treatment is working.  Therapy and counseling to help you manage the stress of living with tinnitus.  Using hearing aids or cochlear implants, if your tinnitus is related to hearing loss. HOME CARE  INSTRUCTIONS  When possible, avoid being in loud places and being exposed to loud sounds.  Wear hearing protection, such as earplugs, when you are exposed to loud noises.  Do not take stimulants, such as nicotine, alcohol, or caffeine.  Practice techniques for reducing stress, such as meditation, yoga, or deep breathing.  Use a white noise  machine, a humidifier, or other devices to mask the sound of tinnitus.  Sleep with your head slightly raised. This may reduce the impact of tinnitus.  Try to get plenty of rest each night. SEEK MEDICAL CARE IF:  You have tinnitus in just one ear.  Your tinnitus continues for 3 weeks or longer without stopping.  Home care measures are not helping.  You have tinnitus after a head injury.  You have tinnitus along with any of the following:  Dizziness.  Loss of balance.  Nausea and vomiting.   This information is not intended to replace advice given to you by your health care provider. Make sure you discuss any questions you have with your health care provider.   Document Released: 12/14/2005 Document Revised: 01/04/2015 Document Reviewed: 05/16/2014 Elsevier Interactive Patient Education [employer].

## 2016-06-11 NOTE — ED Provider Notes (Signed)
[facility] Emergency Department Provider Note   ____________________________________________  Time seen: Approximately 7:25 PM I have reviewed the triage vital signs and the triage nursing note.  HISTORY  Chief Complaint Tinnitus   Historian Patient  HPI Kyle Whitaker is a 82 y.o. male with a history of CHF, and arthritis as well as hypertension, presented to urgent care earlier today with a complaint of hearing a whooshing sound in his left ear. He was sent here after being found to have a left carotid bruit.  Extremities actually had some of this symptomatology for couple of weeks now. No ear discharge. No ear pain. No jaw pain. No palpitations. No chest pain. No trouble breathing. No weakness or numbness. No confusion or headache.    Past Medical History  Diagnosis Date  . Hypertension   . Anxiety   . Arthritis   . Cancer [facility]) 2005    Prostate  . CHF (congestive heart failure) ([place])   . Shortness of breath dyspnea     on exertion    Patient Active Problem List   Diagnosis Date Noted  . Primary osteoarthritis of left hip 10/29/2015    Past Surgical History  Procedure Laterality Date  . Joint replacement Right 2011    Total Hip Replacement, [facility]  . Joint replacement Right 2011    Total Knee Replacement, [facility]  . Knee arthroscopy Right 2011  . Prostate surgery    . Total hip arthroplasty Left 10/29/2015    Procedure: TOTAL HIP ARTHROPLASTY ANTERIOR APPROACH;  Surgeon: Hessie Knows, MD;  Location: [facility] ORS;  Service: Orthopedics;  Laterality: Left;    Current Outpatient Rx  Name  Route  Sig  Dispense  Refill  . atenolol (TENORMIN) 25 MG tablet   Oral   Take 25 mg by mouth daily.         . Cholecalciferol 4000 UNITS CAPS   Oral   Take 1 capsule by mouth daily.         . diazepam (VALIUM) 5 MG tablet   Oral   Take 1 tablet (5 mg total) by mouth 3 (three) times daily as needed for anxiety.   30 tablet   0   . enoxaparin  (LOVENOX) 40 MG/0.4ML injection   Subcutaneous   Inject 0.4 mLs (40 mg total) into the skin daily. Patient not taking: Reported on 11/17/2015   14 Syringe   0   . hydrochlorothiazide (HYDRODIURIL) 25 MG tablet   Oral   Take 25 mg by mouth daily.         Marland Kitchen lisinopril (PRINIVIL,ZESTRIL) 20 MG tablet   Oral   Take 20 mg by mouth daily.         . magnesium hydroxide (MILK OF MAGNESIA) 400 MG/5ML suspension   Oral   Take 30 mLs by mouth every 4 (four) hours as needed for moderate constipation (no BM for 2 days).         Marland Kitchen oxyCODONE (OXY IR/ROXICODONE) 5 MG immediate release tablet   Oral   Take 1-2 tablets (5-10 mg total) by mouth every 3 (three) hours as needed for breakthrough pain.   30 tablet   0     Allergies Penicillins  No family history on file.  Social History Social History  Substance Use Topics  . Smoking status: Former Smoker -- 1.00 packs/day    Types: Cigarettes  . Smokeless tobacco: Never Used  . Alcohol Use: No    Review of Systems  Constitutional:  Negative for fever. Eyes: Negative for visual changes. ENT: Negative for sore throat. Cardiovascular: Negative for chest pain. Respiratory: Negative for shortness of breath. Gastrointestinal: Negative for abdominal pain, vomiting and diarrhea. Genitourinary: Negative for dysuria. Musculoskeletal: Negative for back pain. Skin: Negative for rash. Neurological: Negative for headache. 10 point Review of Systems otherwise negative ____________________________________________   PHYSICAL EXAM:  VITAL SIGNS: ED Triage Vitals  Enc Vitals Group     BP 06/11/16 1643 157/67 mmHg     Pulse Rate 06/11/16 1643 77     Resp 06/11/16 1643 20     Temp 06/11/16 1643 97.8 F (36.6 C)     Temp Source 06/11/16 1643 Oral     SpO2 06/11/16 1643 99 %     Weight --      Height --      Head Cir --      Peak Flow --      Pain Score --      Pain Loc --      Pain Edu? --      Excl. in [place]? --       Constitutional: Alert and oriented. Well appearing and in no distress. HEENT   Head: Normocephalic and atraumatic.      Eyes: Conjunctivae are normal. PERRL. Normal extraocular movements.      Ears:   Normal TMs bilaterally. Normal ear canal. No tender mastoid. No cervical lymphadenopathy.      Nose: No congestion/rhinnorhea.   Mouth/Throat: Mucous membranes are moist.   Neck: No stridor. Left carotid bruit. Cardiovascular/Chest: Normal rate, regular rhythm.  No murmurs, rubs, or gallops. Respiratory: Normal respiratory effort without tachypnea nor retractions. Breath sounds are clear and equal bilaterally. No wheezes/rales/rhonchi. Gastrointestinal: Soft. No distention, no guarding, no rebound. Nontender.    Genitourinary/rectal:Deferred Musculoskeletal: Nontender with normal range of motion in all extremities.  Neurologic:  Normal speech and language. No gross or focal neurologic deficits are appreciated. Skin:  Skin is warm, dry and intact. No rash noted. Psychiatric: Mood and affect are normal. Speech and behavior are normal. Patient exhibits appropriate insight and judgment.  ____________________________________________   EKG I, Lisa Roca, MD, the attending physician have personally viewed and interpreted all ECGs.  None ____________________________________________  LABS (pertinent positives/negatives)  Labs Reviewed  CBC - Abnormal; Notable for the following:    RBC 4.16 (*)    Hemoglobin 12.6 (*)    HCT 37.1 (*)    RDW 15.5 (*)    All other components within normal limits  COMPREHENSIVE METABOLIC PANEL - Abnormal; Notable for the following:    Glucose, Bld 124 (*)    Creatinine, Ser 1.31 (*)    GFR calc non Af Amer 48 (*)    GFR calc Af Amer 56 (*)    All other components within normal limits    ____________________________________________  RADIOLOGY All Xrays were viewed by me. Imaging interpreted by Radiologist.  CT angiogram neck:  Pending __________________________________________  PROCEDURES  Procedure(s) performed: None  Critical Care performed: None  ____________________________________________   ED COURSE / ASSESSMENT AND PLAN  Pertinent labs & imaging results that were available during my care of the patient were reviewed by me and considered in my medical decision making (see chart for details).   Patient is here for intermittent tinnitus which is described as a whooshing and when he says it sounds like a heartbeat. He does indeed have a left carotid bruit. His laboratory studies show mild decreased GFR, but okay for emergency evaluation  for CTA of the neck.   Essentially he is here for complaint of tinnitus without neurologic complaints. If negative, I suspect discharge home with outpatient follow-up.   Patient care transferred to Dr. Mariea Clonts at shift change, 8:30 PM. CT of the neck is pending. If negative the patient may be discharged with my prepared instructions.    CONSULTATIONS:   None   Patient / Family / Caregiver informed of clinical course, medical decision-making process, and agree with plan.   I discussed return precautions, follow-up instructions, and discharged instructions with patient and/or family.   ___________________________________________   FINAL CLINICAL IMPRESSION(S) / ED DIAGNOSES   Final diagnoses:  Right carotid bruit  Tinnitus of right ear              Note: This dictation was prepared with Dragon dictation. Any transcriptional errors that result from this process are unintentional   Lisa Roca, MD 06/11/16 2041

## 2016-06-19 DIAGNOSIS — I6522 Occlusion and stenosis of left carotid artery: Secondary | ICD-10-CM | POA: Insufficient documentation

## 2016-06-19 DIAGNOSIS — I779 Disorder of arteries and arterioles, unspecified: Secondary | ICD-10-CM | POA: Insufficient documentation

## 2016-06-27 ENCOUNTER — Encounter (HOSPITAL_COMMUNITY): Payer: Self-pay

## 2016-06-27 ENCOUNTER — Emergency Department (HOSPITAL_COMMUNITY)
Admission: EM | Admit: 2016-06-27 | Discharge: 2016-06-28 | Disposition: A | Discharge status: Home / Self Care | Payer: Medicare Other | Attending: Emergency Medicine | Admitting: Emergency Medicine

## 2016-06-27 ENCOUNTER — Encounter (HOSPITAL_COMMUNITY)
Admission: EM | Disposition: A | Discharge status: Home / Self Care | Payer: Self-pay | Source: Home / Self Care | Attending: Emergency Medicine

## 2016-06-27 DIAGNOSIS — Z8546 Personal history of malignant neoplasm of prostate: Secondary | ICD-10-CM | POA: Diagnosis not present

## 2016-06-27 DIAGNOSIS — Z87891 Personal history of nicotine dependence: Secondary | ICD-10-CM | POA: Diagnosis not present

## 2016-06-27 DIAGNOSIS — I509 Heart failure, unspecified: Secondary | ICD-10-CM | POA: Insufficient documentation

## 2016-06-27 DIAGNOSIS — R0602 Shortness of breath: Secondary | ICD-10-CM | POA: Diagnosis present

## 2016-06-27 DIAGNOSIS — I213 ST elevation (STEMI) myocardial infarction of unspecified site: Secondary | ICD-10-CM

## 2016-06-27 DIAGNOSIS — I110 Hypertensive heart disease with heart failure: Secondary | ICD-10-CM | POA: Diagnosis not present

## 2016-06-27 DIAGNOSIS — Z79899 Other long term (current) drug therapy: Secondary | ICD-10-CM | POA: Diagnosis not present

## 2016-06-27 SURGERY — INVASIVE LAB ABORTED CASE

## 2016-06-27 SURGICAL SUPPLY — 5 items
KIT HEART LEFT (KITS) ×4 IMPLANT
PACK CARDIAC CATHETERIZATION (CUSTOM PROCEDURE TRAY) ×4 IMPLANT
SYR MEDRAD MARK V 150ML (SYRINGE) ×4 IMPLANT
TRANSDUCER W/STOPCOCK (MISCELLANEOUS) ×4 IMPLANT
TUBING CIL FLEX 10 FLL-RA (TUBING) ×4 IMPLANT
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
## 2016-06-27 NOTE — ED Notes (Signed)
Pt comes from home via [facility], c/o SOB that started tonight, pt had ST elevation on EKG, code STEMI called and canceled by cardiology upon arrival. Denies CP and nausea. PTA received 324 ASA

## 2016-06-28 ENCOUNTER — Emergency Department (HOSPITAL_COMMUNITY): Payer: Medicare Other

## 2016-06-28 ENCOUNTER — Encounter (HOSPITAL_COMMUNITY): Payer: Self-pay | Admitting: Radiology

## 2016-06-28 DIAGNOSIS — R0602 Shortness of breath: Secondary | ICD-10-CM | POA: Diagnosis present

## 2016-06-28 DIAGNOSIS — I2510 Atherosclerotic heart disease of native coronary artery without angina pectoris: Secondary | ICD-10-CM | POA: Diagnosis not present

## 2016-06-28 DIAGNOSIS — I6529 Occlusion and stenosis of unspecified carotid artery: Secondary | ICD-10-CM | POA: Insufficient documentation

## 2016-06-28 DIAGNOSIS — I6523 Occlusion and stenosis of bilateral carotid arteries: Secondary | ICD-10-CM | POA: Diagnosis not present

## 2016-06-28 LAB — CBC WITH DIFFERENTIAL/PLATELET
BASOS PCT: 0 %
Basophils Absolute: 0 10*3/uL (ref 0.0–0.1)
EOS ABS: 0 10*3/uL (ref 0.0–0.7)
Eosinophils Relative: 0 %
HEMATOCRIT: 38.6 % — AB (ref 39.0–52.0)
Hemoglobin: 12.5 g/dL — ABNORMAL LOW (ref 13.0–17.0)
Lymphocytes Relative: 27 %
Lymphs Abs: 2.2 10*3/uL (ref 0.7–4.0)
MCH: 29.9 pg (ref 26.0–34.0)
MCHC: 32.4 g/dL (ref 30.0–36.0)
MCV: 92.3 fL (ref 78.0–100.0)
MONO ABS: 0.6 10*3/uL (ref 0.1–1.0)
MONOS PCT: 8 %
Neutro Abs: 5.4 10*3/uL (ref 1.7–7.7)
Neutrophils Relative %: 65 %
PLATELETS: 240 10*3/uL (ref 150–400)
RBC: 4.18 MIL/uL — ABNORMAL LOW (ref 4.22–5.81)
RDW: 15.4 % (ref 11.5–15.5)
WBC: 8.3 10*3/uL (ref 4.0–10.5)

## 2016-06-28 LAB — COMPREHENSIVE METABOLIC PANEL
ALK PHOS: 68 U/L (ref 38–126)
ALT: 17 U/L (ref 17–63)
AST: 31 U/L (ref 15–41)
Albumin: 3.4 g/dL — ABNORMAL LOW (ref 3.5–5.0)
Anion gap: 8 (ref 5–15)
BUN: 24 mg/dL — AB (ref 6–20)
CALCIUM: 9.1 mg/dL (ref 8.9–10.3)
CO2: 27 mmol/L (ref 22–32)
CREATININE: 1.56 mg/dL — AB (ref 0.61–1.24)
Chloride: 105 mmol/L (ref 101–111)
GFR, EST AFRICAN AMERICAN: 45 mL/min — AB (ref >60)
GFR, EST NON AFRICAN AMERICAN: 39 mL/min — AB (ref >60)
Glucose, Bld: 125 mg/dL — ABNORMAL HIGH (ref 65–99)
Potassium: 3.2 mmol/L — ABNORMAL LOW (ref 3.5–5.1)
Sodium: 140 mmol/L (ref 135–145)
Total Bilirubin: 0.7 mg/dL (ref 0.3–1.2)
Total Protein: 6.5 g/dL (ref 6.5–8.1)

## 2016-06-28 LAB — I-STAT TROPONIN, ED
TROPONIN I, POC: 0.01 ng/mL (ref 0.00–0.08)
Troponin i, poc: 0.01 ng/mL (ref 0.00–0.08)

## 2016-06-28 LAB — BRAIN NATRIURETIC PEPTIDE: B NATRIURETIC PEPTIDE 5: 139.5 pg/mL — AB (ref 0.0–100.0)

## 2016-06-28 MED ORDER — IOPAMIDOL (ISOVUE-370) INJECTION 76%
INTRAVENOUS | Status: AC
  Administered 2016-06-28: 80 mL
  Filled 2016-06-28: qty 100

## 2016-06-28 NOTE — Consult Note (Signed)
CARDIOLOGY INPATIENT CONSULTATION NOTE  Patient ID: SERAFIN GEESLIN MRN: [id number], DOB/AGE: [birth year]   Admit date: 06/27/2016   Primary Physician: Maryland Pink, MD Primary Cardiologist: Dr. Isaias Cowman ([place])  Reason for Consult:   SOB  Requesting Physician: Dr. Gunnar Bulla, ED  HPI: This is a 82 y.o. AAM with HTN, HFpEF  post orthopedic sx, CKD stage 3 (GFR 45), carotid artery stenosis, prior MI who presented tonight with SOB and possible STEMI. On arrival, code STEMI was cancelled due to chronic ST elevation. Pt had a nuclear stress test 2 days ago which was -ve for ischemia but showed small sized inferior scar.   Regarding pt's SOB, pt has dry cough for 1 mo duration. He denied any allergies. He is planned to have CEA in the recent future for which he underwent nuc stress that did not demonstrate ischemia. Pt denied COPD, quit smoking 20-25 yrs ago, is not very mobile and is weak on the left leg more than the right leg. Pt denied prior DVT/PE, recent travel and is not on blood thinners. His dry weight is 201 lbs and he is 198 lbs today. His BNP is elevated mildly.    Problem List: Past Medical History  Diagnosis Date  . Hypertension   . Anxiety   . Arthritis   . Cancer [facility]) 2005    Prostate  . CHF (congestive heart failure) ([place])   . Shortness of breath dyspnea     on exertion    Past Surgical History  Procedure Laterality Date  . Joint replacement Right 2011    Total Hip Replacement, [facility]  . Joint replacement Right 2011    Total Knee Replacement, [facility]  . Knee arthroscopy Right 2011  . Prostate surgery    . Total hip arthroplasty Left 10/29/2015    Procedure: TOTAL HIP ARTHROPLASTY ANTERIOR APPROACH;  Surgeon: Hessie Knows, MD;  Location: [facility] ORS;  Service: Orthopedics;  Laterality: Left;     Allergies:  Allergies  Allergen Reactions  . Penicillins Itching     Home Medications No current facility-administered medications for this encounter.   Current  Outpatient Prescriptions  Medication Sig Dispense Refill  . atenolol (TENORMIN) 25 MG tablet Take 25 mg by mouth daily.    . diazepam (VALIUM) 5 MG tablet Take 1 tablet (5 mg total) by mouth 3 (three) times daily as needed for anxiety. 30 tablet 0  . lisinopril-hydrochlorothiazide (PRINZIDE,ZESTORETIC) 20-25 MG tablet Take 1 tablet by mouth daily.    Marland Kitchen enoxaparin (LOVENOX) 40 MG/0.4ML injection Inject 0.4 mLs (40 mg total) into the skin daily. (Patient not taking: Reported on 11/17/2015) 14 Syringe 0  . oxyCODONE (OXY IR/ROXICODONE) 5 MG immediate release tablet Take 1-2 tablets (5-10 mg total) by mouth every 3 (three) hours as needed for breakthrough pain. (Patient not taking: Reported on 06/28/2016) 30 tablet 0     No family history on file.   Social History   Social History  . Marital Status: Widowed    Spouse Name: N/A  . Number of Children: N/A  . Years of Education: N/A   Occupational History  . Not on file.   Social History Main Topics  . Smoking status: Former Smoker -- 1.00 packs/day    Types: Cigarettes  . Smokeless tobacco: Never Used  . Alcohol Use: No  . Drug Use: No  . Sexual Activity: Not on file   Other Topics Concern  . Not on file   Social History Narrative  Review of Systems: General: fatigue increase weight negative for chills, fever, night sweats  Cardiovascular: denied chest pain, dyspnea on exertion, PND, orthopnea, leg swelling, increased abdominal girth, syncope, presyncope, palpitations Dermatological: negative for rash Respiratory: dry cough negative for wheezing  Urologic: negative for hematuria positive for prostate cancer Abdominal: negative for nausea, vomiting, diarrhea, bright red blood per rectum, melena, or hematemesis Neurologic: negative for visual changes, syncope, or dizziness Hematology: no anemia Psychiatry: non suicidal/homicidal  Musculoskeletal: back pain, hip pain and leg pain   Physical Exam: Vitals: BP 147/79 mmHg   Pulse 71  Temp(Src) 98.2 F (36.8 C) (Oral)  Resp 14  Ht 5\' 9"  (1.753 m)  Wt 89.812 kg (198 lb)  BMI 29.23 kg/m2  SpO2 97% General: not in acute distress Neck: JVP flat, neck supple Heart: regular rate and rhythm, S1, S2, no murmurs  Lungs: prolonged expiratory phase w/o wheezing GI: non tender, non distended, bowel sounds present Extremities: no edema Neuro: AAO x 3, left hip extension is weak Psych: normal affect, no anxiety   Labs:   Results for orders placed or performed during the hospital encounter of 06/27/16 (from the past 24 hour(s))  CBC with Differential     Status: Abnormal   Collection Time: 06/27/16 11:52 PM  Result Value Ref Range   WBC 8.3 4.0 - 10.5 K/uL   RBC 4.18 (L) 4.22 - 5.81 MIL/uL   Hemoglobin 12.5 (L) 13.0 - 17.0 g/dL   HCT 38.6 (L) 39.0 - 52.0 %   MCV 92.3 78.0 - 100.0 fL   MCH 29.9 26.0 - 34.0 pg   MCHC 32.4 30.0 - 36.0 g/dL   RDW 15.4 11.5 - 15.5 %   Platelets 240 150 - 400 K/uL   Neutrophils Relative % 65 %   Neutro Abs 5.4 1.7 - 7.7 K/uL   Lymphocytes Relative 27 %   Lymphs Abs 2.2 0.7 - 4.0 K/uL   Monocytes Relative 8 %   Monocytes Absolute 0.6 0.1 - 1.0 K/uL   Eosinophils Relative 0 %   Eosinophils Absolute 0.0 0.0 - 0.7 K/uL   Basophils Relative 0 %   Basophils Absolute 0.0 0.0 - 0.1 K/uL  Comprehensive metabolic panel     Status: Abnormal   Collection Time: 06/27/16 11:52 PM  Result Value Ref Range   Sodium 140 135 - 145 mmol/L   Potassium 3.2 (L) 3.5 - 5.1 mmol/L   Chloride 105 101 - 111 mmol/L   CO2 27 22 - 32 mmol/L   Glucose, Bld 125 (H) 65 - 99 mg/dL   BUN 24 (H) 6 - 20 mg/dL   Creatinine, Ser 1.56 (H) 0.61 - 1.24 mg/dL   Calcium 9.1 8.9 - 10.3 mg/dL   Total Protein 6.5 6.5 - 8.1 g/dL   Albumin 3.4 (L) 3.5 - 5.0 g/dL   AST 31 15 - 41 U/L   ALT 17 17 - 63 U/L   Alkaline Phosphatase 68 38 - 126 U/L   Total Bilirubin 0.7 0.3 - 1.2 mg/dL   GFR calc non Af Amer 39 (L) >60 mL/min   GFR calc Af Amer 45 (L) >60 mL/min    Anion gap 8 5 - 15  Brain natriuretic peptide     Status: Abnormal   Collection Time: 06/27/16 11:52 PM  Result Value Ref Range   B Natriuretic Peptide 139.5 (H) 0.0 - 100.0 pg/mL  I-stat troponin, ED     Status: None   Collection Time: 06/27/16 11:56 PM  Result  Value Ref Range   Troponin i, poc 0.01 0.00 - 0.08 ng/mL   Comment 3             Radiology/Studies: Dg Chest 2 View  06/28/2016  CLINICAL DATA:  82 year old male with shortness of breath EXAM: CHEST  2 VIEW COMPARISON:  Chest radiograph dated 11/01/2015 FINDINGS: Two views of the chest do not demonstrate a focal consolidation. There is no pleural effusion or pneumothorax. There aorta is tortuous. Stable cardiac silhouette. There is osteopenia with degenerative changes of the spine. No acute osseous pathology. IMPRESSION: No active cardiopulmonary disease. Electronically Signed   By: Anner Crete M.D.   On: 06/28/2016 01:32   Ct Angio Neck W Or Wo Contrast  06/11/2016  CLINICAL DATA:  Initial evaluation for acute whooshing sound 8 left the ear EXAM: CT ANGIOGRAPHY NECK TECHNIQUE: Multidetector CT imaging of the neck was performed using the standard protocol during bolus administration of intravenous contrast. Multiplanar CT image reconstructions and MIPs were obtained to evaluate the vascular anatomy. Carotid stenosis measurements (when applicable) are obtained utilizing NASCET criteria, using the distal internal carotid diameter as the denominator. CONTRAST:  75 cc of Isovue 370. COMPARISON:  None. FINDINGS: Aortic arch: Visualized aortic arch of normal caliber with normal 3 vessel morphology. Scattered calcified and noncalcified plaque within the arch itself and at the origin of the great vessels without high-grade stenosis. Visualized subclavian arteries are patent bilaterally. Right carotid system: Right common carotid artery patent from its origin to the bifurcation without stenosis or acute abnormality. Scattered calcified  atheromatous plaque about the right bifurcation/proximal right ICA. Associated short-segment stenosis of up to 75-80% by NASCET criteria. Stenosis begins at the bifurcation and measures approximately 1 cm in length. Distally, right ICA E widely patent to the skull base without stenosis, dissection, or occlusion. Scattered calcified plaque within the partially visualized cavernous right ICA. Mild to moderate narrowing at the origin of the right external carotid artery. Right external carotid artery and its branches otherwise within normal limits. Left carotid system: Left common carotid artery patent from its origin to the bifurcation without significant stenosis. Eccentric calcified plaque about the left carotid bifurcation with associated stenosis of up to 50% by NASCET criteria. Distally, the left ICA is widely patent to the skullbase without stenosis, dissection, or occlusion. Scattered calcified plaque within the partially visualized cavernous left ICA. Mild to moderate narrowing at the origin of the left external carotid artery. Left external carotid artery and its branches otherwise within normal limits. Vertebral arteries:Both vertebral arteries arise from the subclavian arteries. Right vertebral artery is dominant. Focal plaque at the origin of the right vertebral artery with secondary moderate stenosis of approximately 50%. Scattered plaque present within the pre foraminal right V1 segment with multi focal mild to moderate narrowing. Distally, right vertebral artery otherwise widely patent to the skullbase without stenosis, dissection, or occlusion. Focal plaque within the right V4 segment with mild focal narrowing. Left vertebral artery is diffusely hypoplastic and essentially occluded at its origin. There is patchy irregular filling of the left P2 segment, likely via muscular branches. Left V3 and V4 segments somewhat attenuated but opacified to the level of the vertebrobasilar junction. The Skeleton:  Severe degenerative spondylolysis with prominent bulky anterior osteophytic spurring within the cervical spine, extending from C2-3 through C7-T1. Partial ankylosis of the C4 through C6 vertebral bodies. No acute osseous abnormality. No worrisome lytic or blastic osseous lesions. Other neck: Partially visualized lungs are clear. Visualized superior mediastinum within normal limits.  Visualized upper esophagus is patulous. Thyroid gland within normal limits. No adenopathy within the neck. No acute soft tissue abnormality. Several right-sided sublingual stones noted. No evidence for acute sialoadenitis. IMPRESSION: 1. No acute abnormality involving the major arterial vasculature of the neck. 2. Atheromatous plaque at the right carotid bifurcation/proximal right ICA with associated stenosis of up to 75-80% by NASCET criteria. This stenosis begins at the bifurcation, and extends for approximately 1 cm in length. 3. Focal plaque at the left carotid bifurcation with associated short-segment stenosis of approximately 50% by NASCET criteria. 4. Moderate atheromatous stenosis at the origin of the right vertebral artery, with multi focal atheromatous irregularity throughout the pre foraminal right V1 segment. Dominant right vertebral artery otherwise widely patent within the neck. 5. 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 There is patchy irregular filling distally within the left is V2 segment, likely via muscular branches. More regular patent flow within the left V3 and V4 segments. 6. Extensive degenerative spondylolysis throughout the cervical spine as above. Electronically Signed   By: Jeannine Boga M.D.   On: 06/11/2016 22:48    EKG: normal sinus rhythm with PACs, LVH  Echo: 2016 Preserved LVEF  Lexiscan stress test 06/24/2016 LVEF 52%, small sized scar in the inferior LV wall, no ischemia  Cardiac cath: none  Medical decision making:  Discussed care with the patient Discussed care  with the [facility] and ED physicians  Reviewed labs and imaging personally Reviewed prior records  ASSESSMENT AND PLAN:  This is a 82 y.o. AAM with unclear prior history of CAD no prior LHC, HTN, carotid artery stenosis, HFpEF who presented with acute SOB.     Principal Problem:   Shortness of breath  Acute onset Shortness of breath  Differential diagnosis, mild CHF and PE - consider r/o ACS, cycle trop Q6 x2, r/o PE - pt is at low risk of 1 yr MI (1-2%) based on recent low risk stress test   Chronic ST elevation of V1-V3  Moderate mitral regurgitation  Unable to hear murmur, outpatient f/u with cardiologist  Benign asymptomatic PACs No further treatment needed  Right internal carotid artery stenosis [id number], LICA [id number] - consider starting him on aspirin and statin    Signed, Flossie Dibble, MD MS 06/28/2016, 4:04 AM

## 2016-06-28 NOTE — ED Notes (Signed)
Returned from CT.

## 2016-06-28 NOTE — Discharge Instructions (Signed)
Your labs today were normal. You had 2 negative sets of cardiac enzymes. We did a CT scan of your chest which showed no blood clots, pneumonia, fluid on your lungs or collapsed lung. You have been seen by the hospitalist as well as a cardiologist here who have reviewed your outpatient records including your recent stress test. They feel you are safe to be discharged home with outpatient follow-up. If you develop return if your symptoms, chest pain or chest discomfort, begin sweating, please return to the hospital.   Shortness of Breath Shortness of breath means you have trouble breathing. It could also mean that you have a medical problem. You should get immediate medical care for shortness of breath. CAUSES   Not enough oxygen in the air such as with high altitudes or a smoke-filled room.  Certain lung diseases, infections, or problems.  Heart disease or conditions, such as angina or heart failure.  Low red blood cells (anemia).  Poor physical fitness, which can cause shortness of breath when you exercise.  Chest or back injuries or stiffness.  Being overweight.  Smoking.  Anxiety, which can make you feel like you are not getting enough air. DIAGNOSIS  Serious medical problems can often be found during your physical exam. Tests may also be done to determine why you are having shortness of breath. Tests may include:  Chest X-rays.  Lung function tests.  Blood tests.  An electrocardiogram (ECG).  An ambulatory electrocardiogram. An ambulatory ECG records your heartbeat patterns over a 24-hour period.  Exercise testing.  A transthoracic echocardiogram (TTE). During echocardiography, sound waves are used to evaluate how blood flows through your heart.  A transesophageal echocardiogram (TEE).  Imaging scans. Your health care provider may not be able to find a cause for your shortness of breath after your exam. In this case, it is important to have a follow-up exam with your  health care provider as directed.  TREATMENT  Treatment for shortness of breath depends on the cause of your symptoms and can vary greatly. HOME CARE INSTRUCTIONS   Do not smoke. Smoking is a common cause of shortness of breath. If you smoke, ask for help to quit.  Avoid being around chemicals or things that may bother your breathing, such as paint fumes and dust.  Rest as needed. Slowly resume your usual activities.  If medicines were prescribed, take them as directed for the full length of time directed. This includes oxygen and any inhaled medicines.  Keep all follow-up appointments as directed by your health care provider. SEEK MEDICAL CARE IF:   Your condition does not improve in the time expected.  You have a hard time doing your normal activities even with rest.  You have any new symptoms. SEEK IMMEDIATE MEDICAL CARE IF:   Your shortness of breath gets worse.  You feel light-headed, faint, or develop a cough not controlled with medicines.  You start coughing up blood.  You have pain with breathing.  You have chest pain or pain in your arms, shoulders, or abdomen.  You have a fever.  You are unable to walk up stairs or exercise the way you normally do. MAKE SURE YOU:  Understand these instructions.  Will watch your condition.  Will get help right away if you are not doing well or get worse.   This information is not intended to replace advice given to you by your health care provider. Make sure you discuss any questions you have with your health care provider.  Document Released: 09/08/2001 Document Revised: 12/19/2013 Document Reviewed: 02/29/2012 Elsevier Interactive Patient Education [employer].

## 2016-06-28 NOTE — Consult Note (Addendum)
Medical Consultation   Kyle Whitaker  [id number]  DOB: [birth year]  DOA: 06/27/2016  PCP: Maryland Pink, MD  Outpatient Specialists: Dr. Saralyn Pilar (Cardiology)   Requesting physician: Dr. Leonides Schanz  Reason for consultation: ACS ruleout   History of Present Illness: Kyle Whitaker is an 82 y.o. male with h/o prostate cancer, smoking, carotid artery stenosis that he is actually going to have CEA performed for in coming weeks/months, HTN, osteoarthritis.  Patient presents to the ED with a single episode of SOB that occurred at home PTA.  He does have occaisonal dry cough.  Symptoms of SOB occurred while watching a ball game tonight.  EMS called, patient presented as code STEMI.  After arrival to ED, EKG felt to be unchanged from priors and cardiology canceled code STEMI.  Patient has been asymptomatic since arrival to ED and is back to baseline.  Denies any chest pain.  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  No EtOH since.  Review of Systems:  ROS As per HPI otherwise 10 point review of systems negative.    Past Medical History: Past Medical History  Diagnosis Date  . Hypertension   . Anxiety   . Arthritis   . Cancer [facility]) 2005    Prostate  . CHF (congestive heart failure) ([place])   . Shortness of breath dyspnea     on exertion    Past Surgical History: Past Surgical History  Procedure Laterality Date  . Joint replacement Right 2011    Total Hip Replacement, [facility]  . Joint replacement Right 2011    Total Knee Replacement, [facility]  . Knee arthroscopy Right 2011  . Prostate surgery    . Total hip arthroplasty Left 10/29/2015    Procedure: TOTAL HIP ARTHROPLASTY ANTERIOR APPROACH;  Surgeon: Hessie Knows, MD;  Location: [facility] ORS;  Service: Orthopedics;  Laterality: Left;     Allergies:   Allergies  Allergen Reactions  . Penicillins Itching     Social History:  reports that he has quit smoking. His smoking use  included Cigarettes. He smoked 1.00 pack per day. He has never used smokeless tobacco. He reports that he does not drink alcohol or use illicit drugs.   Family History: No family history on file. Patient dosent know of any family history of blood clots in lung or otherwise.    Physical Exam: Filed Vitals:   06/28/16 0015 06/28/16 0215 06/28/16 0230 06/28/16 0430  BP: 127/69 147/79  180/97  Pulse: 67  71 77  Temp:    97.9 F (36.6 C)  TempSrc:    Oral  Resp: 14   18  Height:      Weight:      SpO2: 96%  97% 100%    Constitutional: Alert and awake, oriented x3, not in any acute distress. Eyes: PERLA, EOMI, irises appear normal, anicteric sclera,  ENMT: external ears and nose appear normal,            Lips appears normal, oropharynx mucosa, tongue, posterior pharynx appear normal  Neck: neck appears normal, no masses, normal ROM, no thyromegaly, no JVD  CVS: S1-S2 clear, no murmur rubs or gallops, no LE edema, normal pedal pulses  Respiratory:  clear to auscultation bilaterally, no wheezing, rales or rhonchi. Respiratory effort normal. No accessory muscle use.  Abdomen: soft nontender, nondistended, normal bowel sounds, no hepatosplenomegaly, no hernias  Musculoskeletal: :  no cyanosis, clubbing or edema noted bilaterally, significant arthritis and stiffness in BLE knees and hips                   Neuro: Cranial nerves II-XII intact, strength, sensation, reflexes Psych: judgement and insight appear normal, stable mood and affect, mental status Skin: no rashes or lesions or ulcers, no induration or nodules    Data reviewed:  I have personally reviewed following labs and imaging studies Labs:  CBC:  Recent Labs Lab 06/27/16 2352  WBC 8.3  NEUTROABS 5.4  HGB 12.5*  HCT 38.6*  MCV 92.3  PLT [id number]    Basic Metabolic Panel:  Recent Labs Lab 06/27/16 2352  NA 140  K 3.2*  CL 105  CO2 27  GLUCOSE 125*  BUN 24*  CREATININE 1.56*  CALCIUM 9.1   GFR Estimated  Creatinine Clearance: 39 mL/min (by C-G formula based on Cr of 1.56). Liver Function Tests:  Recent Labs Lab 06/27/16 2352  AST 31  ALT 17  ALKPHOS 68  BILITOT 0.7  PROT 6.5  ALBUMIN 3.4*   No results for input(s): LIPASE, AMYLASE in the last 168 hours. No results for input(s): AMMONIA in the last 168 hours. Coagulation profile No results for input(s): INR, PROTIME in the last 168 hours.  Cardiac Enzymes: No results for input(s): CKTOTAL, CKMB, CKMBINDEX, TROPONINI in the last 168 hours. BNP: Invalid input(s): POCBNP CBG: No results for input(s): GLUCAP in the last 168 hours. D-Dimer No results for input(s): DDIMER in the last 72 hours. Hgb A1c No results for input(s): HGBA1C in the last 72 hours. Lipid Profile No results for input(s): CHOL, HDL, LDLCALC, TRIG, CHOLHDL, LDLDIRECT in the last 72 hours. Thyroid function studies No results for input(s): TSH, T4TOTAL, T3FREE, THYROIDAB in the last 72 hours.  Invalid input(s): FREET3 Anemia work up No results for input(s): VITAMINB12, FOLATE, FERRITIN, TIBC, IRON, RETICCTPCT in the last 72 hours. Urinalysis    Component Value Date/Time   COLORURINE AMBER* 11/12/2015 0915   APPEARANCEUR TURBID* 11/12/2015 0915   LABSPEC 1.017 11/12/2015 0915   PHURINE 5.0 11/12/2015 0915   GLUCOSEU NEGATIVE 11/12/2015 0915   HGBUR 2+* 11/12/2015 0915   BILIRUBINUR NEGATIVE 11/12/2015 0915   KETONESUR NEGATIVE 11/12/2015 0915   PROTEINUR NEGATIVE 11/12/2015 0915   NITRITE POSITIVE* 11/12/2015 0915   LEUKOCYTESUR 1+* 11/12/2015 0915     Microbiology No results found for this or any previous visit (from the past 240 hour(s)).     Inpatient Medications:   Scheduled Meds: Continuous Infusions:   Radiological Exams on Admission: Dg Chest 2 View  06/28/2016  CLINICAL DATA:  82 year old male with shortness of breath EXAM: CHEST  2 VIEW COMPARISON:  Chest radiograph dated 11/01/2015 FINDINGS: Two views of the chest do not demonstrate  a focal consolidation. There is no pleural effusion or pneumothorax. There aorta is tortuous. Stable cardiac silhouette. There is osteopenia with degenerative changes of the spine. No acute osseous pathology. IMPRESSION: No active cardiopulmonary disease. Electronically Signed   By: Anner Crete M.D.   On: 06/28/2016 01:32    Impression/Recommendations Principal Problem:   Shortness of breath   SOB -  On review of the patients records, and talking with the patient, it turns out that he actually just had a stress test done by his cardiologist 4 days ago!  This was being done as pre-op cardiac evaluation for carotid endarterectomy.  Results of stress test are viewable in our EMR under care everywhere tab.  Our cardiologist has evaluated the patient and looked at the results of stress test and feels that they are low-risk.  Additionally patient has had negative initial and 5 hour troponins and remained symptom free thus far in the ED.  At this point given the above, and given Cardiologist recommendations (in chart), EDP has ordered CTA PE to rule out PE.  If patient rules out for PE (and has no other new pathologic findings on his CTA), then he likely can be reasonably discharged at this point.  Would consider starting patient on ASA and statin if he can take / tolerate these given his known R ICA stenotic disease that hes currently following with vascular surgery and has plans for CEA for.  Thank you for this consultation.     Time Spent: 60 min  GARDNER, JARED M. D.O. [facility] 06/28/2016, 4:40 AM

## 2016-06-28 NOTE — ED Notes (Signed)
Patient transported to CT 

## 2016-06-28 NOTE — ED Notes (Signed)
MD at bedside. 

## 2016-07-07 ENCOUNTER — Other Ambulatory Visit: Payer: Self-pay | Admitting: Vascular Surgery

## 2016-07-10 ENCOUNTER — Ambulatory Visit
Admission: RE | Admit: 2016-07-10 | Discharge: 2016-07-10 | Disposition: A | Discharge status: Home / Self Care | Payer: Medicare Other | Source: Ambulatory Visit | Attending: Vascular Surgery | Admitting: Vascular Surgery

## 2016-07-10 ENCOUNTER — Encounter
Admission: RE | Admit: 2016-07-10 | Discharge: 2016-07-10 | Disposition: A | Discharge status: Home / Self Care | Payer: Medicare Other | Source: Ambulatory Visit | Attending: Vascular Surgery | Admitting: Vascular Surgery

## 2016-07-10 DIAGNOSIS — Z01818 Encounter for other preprocedural examination: Secondary | ICD-10-CM | POA: Diagnosis not present

## 2016-07-10 DIAGNOSIS — I517 Cardiomegaly: Secondary | ICD-10-CM | POA: Insufficient documentation

## 2016-07-10 DIAGNOSIS — I700 Atherosclerosis of aorta: Secondary | ICD-10-CM | POA: Insufficient documentation

## 2016-07-10 HISTORY — DX: Peripheral vascular disease, unspecified: I73.9

## 2016-07-10 HISTORY — DX: Hyperlipidemia, unspecified: E78.5

## 2016-07-10 LAB — BASIC METABOLIC PANEL
ANION GAP: 6 (ref 5–15)
BUN: 20 mg/dL (ref 6–20)
CHLORIDE: 106 mmol/L (ref 101–111)
CO2: 29 mmol/L (ref 22–32)
Calcium: 9.1 mg/dL (ref 8.9–10.3)
Creatinine, Ser: 1.35 mg/dL — ABNORMAL HIGH (ref 0.61–1.24)
GFR calc Af Amer: 54 mL/min — ABNORMAL LOW (ref >60)
GFR calc non Af Amer: 47 mL/min — ABNORMAL LOW (ref >60)
GLUCOSE: 87 mg/dL (ref 65–99)
POTASSIUM: 3.4 mmol/L — AB (ref 3.5–5.1)
Sodium: 141 mmol/L (ref 135–145)

## 2016-07-10 LAB — SURGICAL PCR SCREEN
MRSA, PCR: NEGATIVE
Staphylococcus aureus: NEGATIVE

## 2016-07-10 LAB — TYPE AND SCREEN
ABO/RH(D): O POS
Antibody Screen: NEGATIVE

## 2016-07-10 LAB — CBC WITH DIFFERENTIAL/PLATELET
Basophils Absolute: 0 10*3/uL (ref 0–0.1)
Basophils Relative: 0 %
Eosinophils Absolute: 0 10*3/uL (ref 0–0.7)
Eosinophils Relative: 0 %
HEMATOCRIT: 38.7 % — AB (ref 40.0–52.0)
HEMOGLOBIN: 13 g/dL (ref 13.0–18.0)
LYMPHS PCT: 21 %
Lymphs Abs: 1.5 10*3/uL (ref 1.0–3.6)
MCH: 30.8 pg (ref 26.0–34.0)
MCHC: 33.7 g/dL (ref 32.0–36.0)
MCV: 91.5 fL (ref 80.0–100.0)
MONO ABS: 0.7 10*3/uL (ref 0.2–1.0)
MONOS PCT: 9 %
NEUTROS ABS: 5.3 10*3/uL (ref 1.4–6.5)
NEUTROS PCT: 70 %
Platelets: 239 10*3/uL (ref 150–440)
RBC: 4.23 MIL/uL — ABNORMAL LOW (ref 4.40–5.90)
RDW: 15.8 % — AB (ref 11.5–14.5)
WBC: 7.5 10*3/uL (ref 3.8–10.6)

## 2016-07-10 LAB — APTT: aPTT: 31 seconds (ref 24–36)

## 2016-07-10 LAB — PROTIME-INR
INR: 1.03
Prothrombin Time: 13.7 seconds (ref 11.4–15.0)

## 2016-07-10 NOTE — Patient Instructions (Signed)
  Your procedure is scheduled TD:[id number] 21, 2017 (Friday) Report to Same Day Surgery 2nd floor Medical Mall To find out your arrival time please call [phone] between 1PM - 3PM on  July 16, 2016 (Thursday)  Remember: Instructions that are not followed completely may result in serious medical risk, up to and including death, or upon the discretion of your surgeon and anesthesiologist your surgery may need to be rescheduled.    _x___ 1. Do not eat food or drink liquids after midnight. No gum chewing or hard candies.     _x___ 2. No Alcohol for 24 hours before or after surgery.   _x__3. No Smoking for 24 prior to surgery.   ____  4. Bring all medications with you on the day of surgery if instructed.    __x__ 5. Notify your doctor if there is any change in your medical condition     (cold, fever, infections).     Do not wear jewelry, make-up, hairpins, clips or nail polish.  Do not wear lotions, powders, or perfumes. You may wear deodorant.  Do not shave 48 hours prior to surgery. Men may shave face and neck.  Do not bring valuables to the hospital.    [facility] is not responsible for any belongings or valuables.               Contacts, dentures or bridgework may not be worn into surgery.  Leave your suitcase in the car. After surgery it may be brought to your room.  For patients admitted to the hospital, discharge time is determined by your treatment team.   Patients discharged the day of surgery will not be allowed to drive home.    Please read over the following fact sheets that you were given:   [facility] Preparing for Surgery and or MRSA Information   _x___ Take these medicines the morning of surgery with A SIP OF WATER:    1. Atenolol  2.  3.  4.  5.  6.  ____ Fleet Enema (as directed)   _x___ Use CHG Soap or sage wipes as directed on instruction sheet   ____ Use inhalers on the day of surgery and bring to hospital day of surgery  ____ Stop metformin 2 days  prior to surgery    ____ Take 1/2 of usual insulin dose the night before surgery and none on the morning of surgery            _x__ Stop aspirin or coumadin, or plavix (Patient stated he is not taking Aspirin at present)  _x__ Stop Anti-inflammatories such as Advil, Aleve, Ibuprofen, Motrin, Naproxen,          Naprosyn, Goodies powders or aspirin products. Ok to take Tylenol.   ____ Stop supplements until after surgery.    ____ Bring C-Pap to the hospital.

## 2016-07-17 ENCOUNTER — Inpatient Hospital Stay: Payer: Medicare Other | Admitting: Anesthesiology

## 2016-07-17 ENCOUNTER — Encounter
Admission: RE | Disposition: A | Discharge status: Home / Self Care | Payer: Self-pay | Source: Ambulatory Visit | Attending: Vascular Surgery

## 2016-07-17 ENCOUNTER — Encounter: Payer: Self-pay | Admitting: *Deleted

## 2016-07-17 ENCOUNTER — Inpatient Hospital Stay
Admission: RE | Admit: 2016-07-17 | Discharge: 2016-07-18 | DRG: 039 | Disposition: A | Discharge status: Home / Self Care | Payer: Medicare Other | Source: Ambulatory Visit | Attending: Vascular Surgery | Admitting: Vascular Surgery

## 2016-07-17 DIAGNOSIS — Z79899 Other long term (current) drug therapy: Secondary | ICD-10-CM | POA: Diagnosis not present

## 2016-07-17 DIAGNOSIS — Z87891 Personal history of nicotine dependence: Secondary | ICD-10-CM | POA: Diagnosis not present

## 2016-07-17 DIAGNOSIS — M1990 Unspecified osteoarthritis, unspecified site: Secondary | ICD-10-CM | POA: Diagnosis present

## 2016-07-17 DIAGNOSIS — Z880 Allergy status to penicillin: Secondary | ICD-10-CM | POA: Diagnosis not present

## 2016-07-17 DIAGNOSIS — I739 Peripheral vascular disease, unspecified: Secondary | ICD-10-CM | POA: Diagnosis present

## 2016-07-17 DIAGNOSIS — Z96651 Presence of right artificial knee joint: Secondary | ICD-10-CM | POA: Diagnosis present

## 2016-07-17 DIAGNOSIS — I6529 Occlusion and stenosis of unspecified carotid artery: Secondary | ICD-10-CM | POA: Diagnosis present

## 2016-07-17 DIAGNOSIS — Z885 Allergy status to narcotic agent status: Secondary | ICD-10-CM

## 2016-07-17 DIAGNOSIS — I509 Heart failure, unspecified: Secondary | ICD-10-CM | POA: Diagnosis present

## 2016-07-17 DIAGNOSIS — I110 Hypertensive heart disease with heart failure: Secondary | ICD-10-CM | POA: Diagnosis present

## 2016-07-17 DIAGNOSIS — Z8546 Personal history of malignant neoplasm of prostate: Secondary | ICD-10-CM

## 2016-07-17 DIAGNOSIS — Z96643 Presence of artificial hip joint, bilateral: Secondary | ICD-10-CM | POA: Diagnosis present

## 2016-07-17 DIAGNOSIS — I6521 Occlusion and stenosis of right carotid artery: Principal | ICD-10-CM | POA: Diagnosis present

## 2016-07-17 DIAGNOSIS — Z7982 Long term (current) use of aspirin: Secondary | ICD-10-CM

## 2016-07-17 HISTORY — PX: ENDARTERECTOMY: SHX5162

## 2016-07-17 LAB — GLUCOSE, CAPILLARY: GLUCOSE-CAPILLARY: 116 mg/dL — AB (ref 65–99)

## 2016-07-17 SURGERY — ENDARTERECTOMY, CAROTID
Anesthesia: General | Laterality: Right | Wound class: Clean

## 2016-07-17 MED ORDER — SODIUM CHLORIDE 0.9 % IV SOLN
INTRAVENOUS | Status: DC | PRN
  Administered 2016-07-17: 80 mL via INTRAMUSCULAR

## 2016-07-17 MED ORDER — OXYCODONE HCL 5 MG/5ML PO SOLN: 5.0000 mg | Freq: Once | ORAL | Status: DC | PRN

## 2016-07-17 MED ORDER — NEOSTIGMINE METHYLSULFATE 10 MG/10ML IV SOLN
INTRAVENOUS | Status: DC | PRN
  Administered 2016-07-17: 5 mg via INTRAVENOUS

## 2016-07-17 MED ORDER — SODIUM CHLORIDE FLUSH 0.9 % IV SOLN
INTRAVENOUS | Status: AC
  Administered 2016-07-17: 11:00:00
  Filled 2016-07-17: qty 3

## 2016-07-17 MED ORDER — MEPERIDINE HCL 25 MG/ML IJ SOLN: 6.2500 mg | INTRAMUSCULAR | Status: DC | PRN

## 2016-07-17 MED ORDER — ALUM & MAG HYDROXIDE-SIMETH 200-200-20 MG/5ML PO SUSP: 15.0000 mL | ORAL | Status: DC | PRN

## 2016-07-17 MED ORDER — GLYCOPYRROLATE 0.2 MG/ML IJ SOLN
INTRAMUSCULAR | Status: DC | PRN
  Administered 2016-07-17: .8 mg via INTRAVENOUS

## 2016-07-17 MED ORDER — HYDRALAZINE HCL 20 MG/ML IJ SOLN
5.0000 mg | INTRAMUSCULAR | Status: AC | PRN
  Administered 2016-07-17 (×2): 5 mg via INTRAVENOUS
  Filled 2016-07-17 (×2): qty 1
  Filled 2016-07-17: qty 0.25

## 2016-07-17 MED ORDER — NITROGLYCERIN IN D5W 200-5 MCG/ML-% IV SOLN: 5.0000 ug/min | INTRAVENOUS | Status: DC

## 2016-07-17 MED ORDER — SODIUM CHLORIDE 0.9 % IV SOLN: 500.0000 mL | Freq: Once | INTRAVENOUS | Status: DC | PRN

## 2016-07-17 MED ORDER — FAMOTIDINE 20 MG PO TABS
ORAL_TABLET | ORAL | Status: AC
  Administered 2016-07-17: 20 mg via ORAL
  Filled 2016-07-17: qty 1

## 2016-07-17 MED ORDER — LABETALOL HCL 5 MG/ML IV SOLN: 10.0000 mg | INTRAVENOUS | Status: DC | PRN

## 2016-07-17 MED ORDER — DOPAMINE-DEXTROSE 3.2-5 MG/ML-% IV SOLN: 3.0000 ug/kg/min | INTRAVENOUS | Status: DC

## 2016-07-17 MED ORDER — CLINDAMYCIN PHOSPHATE 300 MG/50ML IV SOLN: 300.0000 mg | Freq: Once | INTRAVENOUS | Status: DC

## 2016-07-17 MED ORDER — DIAZEPAM 5 MG PO TABS
5.0000 mg | ORAL_TABLET | Freq: Three times a day (TID) | ORAL | Status: DC | PRN
  Administered 2016-07-17 – 2016-07-18 (×2): 5 mg via ORAL
  Filled 2016-07-17 (×2): qty 1

## 2016-07-17 MED ORDER — CETYLPYRIDINIUM CHLORIDE 0.05 % MT LIQD
7.0000 mL | Freq: Two times a day (BID) | OROMUCOSAL | Status: DC
  Administered 2016-07-17: 7 mL via OROMUCOSAL

## 2016-07-17 MED ORDER — MORPHINE SULFATE (PF) 2 MG/ML IV SOLN: 2.0000 mg | INTRAVENOUS | Status: DC | PRN

## 2016-07-17 MED ORDER — LACTATED RINGERS IV SOLN
INTRAVENOUS | Status: DC
  Administered 2016-07-17 (×2): via INTRAVENOUS

## 2016-07-17 MED ORDER — EVICEL 2 ML EX KIT
PACK | CUTANEOUS | Status: AC
  Filled 2016-07-17: qty 1

## 2016-07-17 MED ORDER — ATENOLOL 50 MG PO TABS
50.0000 mg | ORAL_TABLET | Freq: Every day | ORAL | Status: DC
  Administered 2016-07-18: 50 mg via ORAL
  Filled 2016-07-17: qty 1

## 2016-07-17 MED ORDER — EVICEL 2 ML EX KIT
PACK | CUTANEOUS | Status: DC | PRN
  Administered 2016-07-17: 2 mL via TOPICAL

## 2016-07-17 MED ORDER — LABETALOL HCL 5 MG/ML IV SOLN
INTRAVENOUS | Status: AC
  Administered 2016-07-17: 10 mg via INTRAVENOUS
  Filled 2016-07-17: qty 4

## 2016-07-17 MED ORDER — CLINDAMYCIN PHOSPHATE 300 MG/50ML IV SOLN
INTRAVENOUS | Status: AC
  Administered 2016-07-17: 300 mg via INTRAVENOUS
  Filled 2016-07-17: qty 50

## 2016-07-17 MED ORDER — NITROGLYCERIN IN D5W 200-5 MCG/ML-% IV SOLN
INTRAVENOUS | Status: AC
  Filled 2016-07-17: qty 250

## 2016-07-17 MED ORDER — CHLORHEXIDINE GLUCONATE CLOTH 2 % EX PADS: 6.0000 | MEDICATED_PAD | Freq: Once | CUTANEOUS | Status: DC

## 2016-07-17 MED ORDER — FENTANYL CITRATE (PF) 100 MCG/2ML IJ SOLN
25.0000 ug | INTRAMUSCULAR | Status: DC | PRN
  Administered 2016-07-17 (×3): 25 ug via INTRAVENOUS

## 2016-07-17 MED ORDER — FENTANYL CITRATE (PF) 100 MCG/2ML IJ SOLN
INTRAMUSCULAR | Status: DC | PRN
  Administered 2016-07-17: 50 ug via INTRAVENOUS
  Administered 2016-07-17: 100 ug via INTRAVENOUS

## 2016-07-17 MED ORDER — ONDANSETRON HCL 4 MG/2ML IJ SOLN
INTRAMUSCULAR | Status: DC | PRN
  Administered 2016-07-17: 4 mg via INTRAVENOUS

## 2016-07-17 MED ORDER — GUAIFENESIN-DM 100-10 MG/5ML PO SYRP: 15.0000 mL | ORAL_SOLUTION | ORAL | Status: DC | PRN

## 2016-07-17 MED ORDER — TRAMADOL HCL 50 MG PO TABS: 50.0000 mg | ORAL_TABLET | Freq: Four times a day (QID) | ORAL | Status: DC | PRN

## 2016-07-17 MED ORDER — FAMOTIDINE 20 MG PO TABS
20.0000 mg | ORAL_TABLET | Freq: Once | ORAL | Status: AC
Start: 2016-07-17 — End: 2016-07-17
  Administered 2016-07-17: 20 mg via ORAL

## 2016-07-17 MED ORDER — OXYCODONE HCL 5 MG PO TABS: 5.0000 mg | ORAL_TABLET | Freq: Once | ORAL | Status: DC | PRN

## 2016-07-17 MED ORDER — LISINOPRIL 20 MG PO TABS
20.0000 mg | ORAL_TABLET | Freq: Every day | ORAL | Status: DC
  Administered 2016-07-17 – 2016-07-18 (×2): 20 mg via ORAL
  Filled 2016-07-17 (×2): qty 1

## 2016-07-17 MED ORDER — LABETALOL HCL 5 MG/ML IV SOLN
10.0000 mg | INTRAVENOUS | Status: AC | PRN
  Administered 2016-07-17 (×4): 10 mg via INTRAVENOUS
  Filled 2016-07-17 (×4): qty 4

## 2016-07-17 MED ORDER — LACTATED RINGERS IV SOLN
INTRAVENOUS | Status: DC | PRN
  Administered 2016-07-17: 07:00:00 via INTRAVENOUS

## 2016-07-17 MED ORDER — HEPARIN SODIUM (PORCINE) 5000 UNIT/ML IJ SOLN
INTRAMUSCULAR | Status: AC
  Filled 2016-07-17: qty 1

## 2016-07-17 MED ORDER — KCL IN DEXTROSE-NACL 20-5-0.9 MEQ/L-%-% IV SOLN
INTRAVENOUS | Status: DC
  Administered 2016-07-17 – 2016-07-18 (×2): via INTRAVENOUS
  Filled 2016-07-17 (×3): qty 1000

## 2016-07-17 MED ORDER — LIDOCAINE HCL (PF) 1 % IJ SOLN
INTRAMUSCULAR | Status: AC
  Filled 2016-07-17: qty 30

## 2016-07-17 MED ORDER — CLINDAMYCIN PHOSPHATE 300 MG/50ML IV SOLN
INTRAVENOUS | Status: AC
  Filled 2016-07-17: qty 50

## 2016-07-17 MED ORDER — ONDANSETRON HCL 4 MG/2ML IJ SOLN: 4.0000 mg | Freq: Four times a day (QID) | INTRAMUSCULAR | Status: DC | PRN

## 2016-07-17 MED ORDER — HYDROCHLOROTHIAZIDE 25 MG PO TABS
25.0000 mg | ORAL_TABLET | Freq: Every day | ORAL | Status: DC
  Administered 2016-07-17 – 2016-07-18 (×2): 25 mg via ORAL
  Filled 2016-07-17 (×2): qty 1

## 2016-07-17 MED ORDER — SODIUM CHLORIDE 0.9 % IV SOLN
10000.0000 ug | INTRAVENOUS | Status: DC | PRN
  Administered 2016-07-17: 15 ug/min via INTRAVENOUS

## 2016-07-17 MED ORDER — PROPOFOL 10 MG/ML IV BOLUS
INTRAVENOUS | Status: DC | PRN
  Administered 2016-07-17: 50 mg via INTRAVENOUS
  Administered 2016-07-17: 130 mg via INTRAVENOUS

## 2016-07-17 MED ORDER — PHENOL 1.4 % MT LIQD
1.0000 | OROMUCOSAL | Status: DC | PRN
  Filled 2016-07-17: qty 177

## 2016-07-17 MED ORDER — ASPIRIN EC 81 MG PO TBEC
81.0000 mg | DELAYED_RELEASE_TABLET | Freq: Every day | ORAL | Status: DC
  Administered 2016-07-17 – 2016-07-18 (×2): 81 mg via ORAL
  Filled 2016-07-17 (×2): qty 1

## 2016-07-17 MED ORDER — ROCURONIUM BROMIDE 100 MG/10ML IV SOLN
INTRAVENOUS | Status: DC | PRN
  Administered 2016-07-17: 10 mg via INTRAVENOUS
  Administered 2016-07-17: 50 mg via INTRAVENOUS
  Administered 2016-07-17: 20 mg via INTRAVENOUS
  Administered 2016-07-17: 10 mg via INTRAVENOUS

## 2016-07-17 MED ORDER — NITROGLYCERIN 0.2 MG/ML ON CALL CATH LAB
INTRAVENOUS | Status: DC | PRN
  Administered 2016-07-17: 40 ug via INTRAVENOUS
  Administered 2016-07-17 (×4): 80 ug via INTRAVENOUS

## 2016-07-17 MED ORDER — LISINOPRIL-HYDROCHLOROTHIAZIDE 20-25 MG PO TABS: 1.0000 | ORAL_TABLET | Freq: Every day | ORAL | Status: DC

## 2016-07-17 MED ORDER — DOCUSATE SODIUM 100 MG PO CAPS
100.0000 mg | ORAL_CAPSULE | Freq: Every day | ORAL | Status: DC
  Administered 2016-07-18: 100 mg via ORAL
  Filled 2016-07-17: qty 1

## 2016-07-17 MED ORDER — CLINDAMYCIN PHOSPHATE 300 MG/50ML IV SOLN
300.0000 mg | Freq: Three times a day (TID) | INTRAVENOUS | Status: AC
  Administered 2016-07-17 – 2016-07-18 (×3): 300 mg via INTRAVENOUS
  Filled 2016-07-17 (×3): qty 50

## 2016-07-17 MED ORDER — LABETALOL HCL 5 MG/ML IV SOLN
INTRAVENOUS | Status: DC | PRN
  Administered 2016-07-17 (×3): 5 mg via INTRAVENOUS

## 2016-07-17 MED ORDER — PROMETHAZINE HCL 25 MG/ML IJ SOLN: 6.2500 mg | INTRAMUSCULAR | Status: DC | PRN

## 2016-07-17 MED ORDER — HYDRALAZINE HCL 20 MG/ML IJ SOLN: 5.0000 mg | INTRAMUSCULAR | Status: DC | PRN

## 2016-07-17 MED ORDER — METOPROLOL TARTRATE 5 MG/5ML IV SOLN
2.0000 mg | INTRAVENOUS | Status: DC | PRN
Start: 2016-07-17 — End: 2016-07-18

## 2016-07-17 MED ORDER — FENTANYL CITRATE (PF) 100 MCG/2ML IJ SOLN
INTRAMUSCULAR | Status: AC
  Administered 2016-07-17: 25 ug via INTRAVENOUS
  Filled 2016-07-17: qty 2

## 2016-07-17 MED ORDER — PHENYLEPHRINE HCL 10 MG/ML IJ SOLN
INTRAMUSCULAR | Status: DC | PRN
  Administered 2016-07-17 (×3): 50 ug via INTRAVENOUS

## 2016-07-17 MED ORDER — LIDOCAINE HCL (CARDIAC) 20 MG/ML IV SOLN
INTRAVENOUS | Status: DC | PRN
  Administered 2016-07-17: 40 mg via INTRAVENOUS

## 2016-07-17 MED ORDER — HEPARIN SODIUM (PORCINE) 1000 UNIT/ML IJ SOLN
INTRAMUSCULAR | Status: DC | PRN
  Administered 2016-07-17: 7000 [IU] via INTRAVENOUS

## 2016-07-17 MED ORDER — ATENOLOL 25 MG PO TABS: 25.0000 mg | ORAL_TABLET | Freq: Every day | ORAL | Status: DC

## 2016-07-17 SURGICAL SUPPLY — 63 items
APPLIER CLIP 11 MED OPEN (CLIP)
APPLIER CLIP 9.375 SM OPEN (CLIP)
BAG DECANTER FOR FLEXI CONT (MISCELLANEOUS) ×3 IMPLANT
BLADE SURG 15 STRL LF DISP TIS (BLADE) ×1 IMPLANT
BLADE SURG 15 STRL SS (BLADE) ×2
BLADE SURG SZ11 CARB STEEL (BLADE) ×3 IMPLANT
BOOT SUTURE AID YELLOW STND (SUTURE) ×6 IMPLANT
BRUSH SCRUB 4% CHG (MISCELLANEOUS) ×3 IMPLANT
CANISTER SUCT 1200ML W/VALVE (MISCELLANEOUS) ×3 IMPLANT
CATH TRAY 16F METER LATEX (MISCELLANEOUS) ×3 IMPLANT
CLIP APPLIE 11 MED OPEN (CLIP) IMPLANT
CLIP APPLIE 9.375 SM OPEN (CLIP) IMPLANT
DRAPE INCISE IOBAN 66X45 STRL (DRAPES) ×3 IMPLANT
DRAPE LAPAROTOMY 100X77 ABD (DRAPES) ×3 IMPLANT
DRAPE SHEET LG 3/4 BI-LAMINATE (DRAPES) ×3 IMPLANT
DRESSING SURGICEL FIBRLLR 1X2 (HEMOSTASIS) ×1 IMPLANT
DRSG SURGICEL FIBRILLAR 1X2 (HEMOSTASIS) ×3
DURAPREP 26ML APPLICATOR (WOUND CARE) ×3 IMPLANT
ELECT CAUTERY BLADE 6.4 (BLADE) ×3 IMPLANT
ELECT REM PT RETURN 9FT ADLT (ELECTROSURGICAL) ×3
ELECTRODE REM PT RTRN 9FT ADLT (ELECTROSURGICAL) ×1 IMPLANT
GLOVE BIO SURGEON STRL SZ7 (GLOVE) ×9 IMPLANT
GLOVE INDICATOR 7.5 STRL GRN (GLOVE) ×6 IMPLANT
GLOVE SURG SYN 8.0 (GLOVE) ×3 IMPLANT
GOWN STRL REUS W/ TWL LRG LVL3 (GOWN DISPOSABLE) ×2 IMPLANT
GOWN STRL REUS W/ TWL XL LVL3 (GOWN DISPOSABLE) ×1 IMPLANT
GOWN STRL REUS W/TWL LRG LVL3 (GOWN DISPOSABLE) ×4
GOWN STRL REUS W/TWL XL LVL3 (GOWN DISPOSABLE) ×2
HOLDER FOLEY CATH W/STRAP (MISCELLANEOUS) ×3 IMPLANT
IV NS 500ML (IV SOLUTION) ×2
IV NS 500ML BAXH (IV SOLUTION) ×1 IMPLANT
KIT RM TURNOVER STRD PROC AR (KITS) ×3 IMPLANT
LABEL OR SOLS (LABEL) ×3 IMPLANT
LIQUID BAND (GAUZE/BANDAGES/DRESSINGS) ×3 IMPLANT
LOOP RED MAXI  1X406MM (MISCELLANEOUS) ×4
LOOP VESSEL MAXI 1X406 RED (MISCELLANEOUS) ×2 IMPLANT
LOOP VESSEL MINI 0.8X406 BLUE (MISCELLANEOUS) ×1 IMPLANT
LOOPS BLUE MINI 0.8X406MM (MISCELLANEOUS) ×2
NEEDLE FILTER BLUNT 18X 1/2SAF (NEEDLE) ×4
NEEDLE FILTER BLUNT 18X1 1/2 (NEEDLE) ×2 IMPLANT
NEEDLE HYPO 25X1 1.5 SAFETY (NEEDLE) ×3 IMPLANT
NS IRRIG 500ML POUR BTL (IV SOLUTION) ×3 IMPLANT
PACK BASIN MAJOR ARMC (MISCELLANEOUS) ×3 IMPLANT
PENCIL ELECTRO HAND CTR (MISCELLANEOUS) IMPLANT
SHUNT CAROTID STR REINF 3.0X4. (MISCELLANEOUS) ×3 IMPLANT
SHUNT W TPORT 9FR PRUITT F3 (SHUNT) IMPLANT
SUT MNCRL+ 5-0 UNDYED PC-3 (SUTURE) ×1 IMPLANT
SUT MONOCRYL 5-0 (SUTURE) ×2
SUT PROLENE 6 0 BV (SUTURE) ×24 IMPLANT
SUT PROLENE 7 0 BV 1 (SUTURE) ×21 IMPLANT
SUT SILK 2 0 (SUTURE) ×2
SUT SILK 2-0 18XBRD TIE 12 (SUTURE) ×1 IMPLANT
SUT SILK 3 0 (SUTURE) ×2
SUT SILK 3-0 18XBRD TIE 12 (SUTURE) ×1 IMPLANT
SUT SILK 4 0 (SUTURE) ×2
SUT SILK 4-0 18XBRD TIE 12 (SUTURE) ×1 IMPLANT
SUT VIC AB 3-0 SH 27 (SUTURE) ×2
SUT VIC AB 3-0 SH 27X BRD (SUTURE) ×1 IMPLANT
SYR 20CC LL (SYRINGE) ×3 IMPLANT
SYR 3ML LL SCALE MARK (SYRINGE) ×3 IMPLANT
SYRINGE 10CC LL (SYRINGE) ×6 IMPLANT
TUBING CONNECTING 10 (TUBING) IMPLANT
TUBING CONNECTING 10' (TUBING)

## 2016-07-17 NOTE — H&P (Signed)
[facility] History & Physical Update  The patient was interviewed and re-examined.  The patient's previous History and Physical has been reviewed and is unchanged.  There is no change in the plan of care. We plan to proceed with the scheduled procedure.  Schnier, Dolores Lory, MD  07/17/2016, 7:24 AM

## 2016-07-17 NOTE — Progress Notes (Signed)
Patient rested well post procedure. No pain reported, vss at this time. Patient increased BP concerning and discussed with Dr. Delana Meyer, nitroglycerin gtt ordered if needed. Patient BP has remained below SBP 160 (order parameter) since discussion, so gtt not started. Patient educated on post procedure plan, no questions or concerns at this time. Wilnette Kales

## 2016-07-17 NOTE — Anesthesia Postprocedure Evaluation (Signed)
Anesthesia Post Note  Patient: Kyle Whitaker  Procedure(s) Performed: Procedure(s) (LRB): ENDARTERECTOMY CAROTID (Right)  Patient location during evaluation: PACU Anesthesia Type: General Level of consciousness: awake and alert Pain management: pain level controlled Vital Signs Assessment: post-procedure vital signs reviewed and stable Respiratory status: spontaneous breathing, nonlabored ventilation, respiratory function stable and patient connected to nasal cannula oxygen Cardiovascular status: blood pressure returned to baseline and stable Postop Assessment: no signs of nausea or vomiting Anesthetic complications: no    Last Vitals:  Filed Vitals:   07/17/16 1136 07/17/16 1157  BP: 154/81 192/80  Pulse: 66   Temp: 36.5 C   Resp: 11     Last Pain:  Filed Vitals:   07/17/16 1158  PainSc: 2                  Summerlyn Fickel

## 2016-07-17 NOTE — Anesthesia Procedure Notes (Signed)
Procedure Name: Intubation Performed by: Lance Muss Pre-anesthesia Checklist: Patient identified, Patient being monitored, Timeout performed, Emergency Drugs available and Suction available Patient Re-evaluated:Patient Re-evaluated prior to inductionOxygen Delivery Method: Circle system utilized Preoxygenation: Pre-oxygenation with 100% oxygen Intubation Type: IV induction Ventilation: Mask ventilation without difficulty Laryngoscope Size: Mac and 3 Grade View: Grade I Tube type: Oral Tube size: 7.5 mm Number of attempts: 1 Airway Equipment and Method: Stylet and LTA kit utilized Placement Confirmation: ETT inserted through vocal cords under direct vision,  positive ETCO2 and breath sounds checked- equal and bilateral Secured at: 25 cm Tube secured with: Tape Dental Injury: Teeth and Oropharynx as per pre-operative assessment

## 2016-07-17 NOTE — Progress Notes (Signed)
Patient seen postop  S:  Denies severe pain  O: Heart rate 90 blood pressure 176/80      Neck clean dry and intact no hematoma      Neuro intact 5 out of 5 strength left hand left leg  A:  Status post right carotid endarterectomy with a normal postoperative course  P:  I have reordered antihypertensive medications if these are not successful at maintaining his blood pressure then a nitroglycerin drip will be initiated. Otherwise he is doing very well no significant problems.

## 2016-07-17 NOTE — Op Note (Signed)
VEIN AND VASCULAR SURGERY   OPERATIVE NOTE  PROCEDURE:   1.  Right carotid endarterectomy with Primary Closure  PRE-OPERATIVE DIAGNOSIS: 1.  Critical stenosis of the right Carotid Artery 2. Hypertension  POST-OPERATIVE DIAGNOSIS: same as above   SURGEON: Katha Cabal, MD  ASSISTANT(S): None  ANESTHESIA: general  ESTIMATED BLOOD LOSS: 150 cc  FINDING(S): 1.  Extensive calcified carotid plaque.  SPECIMEN(S):  Carotid plaque (sent to Pathology)  INDICATIONS:   Kyle Whitaker is a 82 y.o. y.o. male who presents with right carotid stenosis of 90 %.  The risks, benefits, and alternatives to carotid endarterectomy were discussed with the patient. The differences between carotid stenting and carotid endarterectomy were reviewed.  The patient voiced understanding and appears to be aware that the risks of carotid endarterectomy include but are not limited to: bleeding, infection, stroke, myocardial infarction, death, cranial nerve injuries both temporary and permanent, neck hematoma, possible airway compromise, labile blood pressure post-operatively, cerebral hyperperfusion syndrome, and possible need for additional interventions in the future. The patient is aware of the risks and agrees to proceed forward with the procedure.  DESCRIPTION: After full informed written consent was obtained from the patient, the patient was brought back to the operating room and placed supine upon the operating table.  Prior to induction, the patient received IV antibiotics.  After obtaining adequate anesthesia, the patient was placed a supine position with a shoulder roll in place and the patient's neck slightly hyperextended and rotated away from the surgical site.  The patient was prepped in the standard fashion for a carotid endarterectomy.    The skin incision was made in the right neck anterior to the sternocleidomastoid muscle and dissected down through the subcutaneous tissue.  The platysmas  was opened with electrocautery.  The internal jugular vein and facial vein were identified.  The facial vein is ligated and divided between 2-0 silk ties.  The omohyoid was identified in the common carotid artery exposed at this level. The dissection was there in carried out along the carotid artery in a cranial direction.  The dissection was then carried along periadventitial plane along the common carotid artery up to the bifurcation. The external carotid artery was identified. Vessel loops were then placed around the external carotid artery as well as the superior thyroid artery. In the process of this dissection, the hypoglossal nerve was identified and protected from harm.  The internal carotid artery was then dissected circumferentially just beyond an area in the internal carotid artery distal to the plaque.    At this point, we gave the patient 7000 units of intravenous heparin and this was allowed to circulate for several minutes.  The common carotid artery followed by the external carotid and then the internal carotid artery were clamped.  Arteriotomy was made in the common carotid artery with a 11 blade, and extended the arteriotomy with a Potts scissor down into the common carotid artery, then the arteriotomy was carried through the bifurcation into the internal carotid artery until I reached an area that was not diseased.  At this point, a Sundt shunt was placed without difficulty.  The endarterectomy was begun in the common carotid artery with a Garment/textile technologist and carried this dissection down into the common carotid artery circumferentially.  Then I transected the plaque at a segment where it was adherent and transected the plaque with Potts scissors.  I then carried this dissection up into the external carotid artery.  The plaque was extracted by  unclamping the external carotid artery and performing an eversion endarterectomy.  The dissection was then carried into the internal carotid artery  where a  feathered end point was created.  The plaque was passed off the field as a specimen.  The distal endpoint was tacked down with 6 interrupted 7-0 Prolene sutures.  The arteriotomy was then closed using running 6-0 Prolene beginning at the distal margin and running the second 6-0 Prolene from the common carotid end.  Prior to completing the primary repair, the shunt was removed, the internal carotid artery was flushed and there was excellent backbleeding.  The carotid artery repair was flushed with heparinized saline and then the primary repair was completed in the usual fashion.  The flow was then reestablished first to the external carotid artery and then the internal carotid artery to prevent distal embolization.   Several minutes of pressure were held and 6-0 Prolene sutures were used as need for hemostasis.  At this point, I placed Surgicel and Evicel topical hemostatic agents.  There was no more active bleeding in the surgical site.  The sternocleidomastoid space was closed with three interrupted 3-0 Vicryl sutures. I then reapproximated the platysma muscle with a running stitch of 3-0 Vicryl.  The skin was then closed with a running subcuticular 4-0 Monocryl.  The skin was then cleaned, dried and Dermabond was used to reinforce the skin closure.  The patient awakened and was taken to the recovery room in stable condition, following commands and moving all four extremities without any apparent deficits.    COMPLICATIONS: none  CONDITION: stable  Heitor Steinhoff G 07/17/2016<10:21 AM

## 2016-07-17 NOTE — Progress Notes (Signed)
When Dr. Delana Meyer rounded on patient, MD and RNs discussed arterial line blood pressure's difference from non-invasive cuff pressure. Per MD when patient hypertensive, non-invasive cuff will produce more reliable pressure. MD also reviewed patient's blood pressure medications.

## 2016-07-17 NOTE — Anesthesia Preprocedure Evaluation (Addendum)
Anesthesia Evaluation  Patient identified by MRN, date of birth, ID band Patient awake    Reviewed: Allergy & Precautions, NPO status , Patient's Chart, lab work & pertinent test results, reviewed documented beta blocker date and time   History of Anesthesia Complications Negative for: history of anesthetic complications  Airway Mallampati: III  TM Distance: >3 FB Neck ROM: Full    Dental  (+) Edentulous Lower, Edentulous Upper   Pulmonary shortness of breath, neg COPD, former smoker,    breath sounds clear to auscultation- rhonchi (-) wheezing      Cardiovascular hypertension, + Peripheral Vascular Disease and +CHF (preserved EF)  (-) CAD and (-) Past MI  Rhythm:Regular Rate:Normal - Systolic murmurs and - Diastolic murmurs Echo: [id number] Preserved LVEF  Lexiscan stress test 06/24/2016 LVEF 52%, small sized scar in the inferior LV wall, no ischemia   Neuro/Psych negative neurological ROS  negative psych ROS   GI/Hepatic negative GI ROS, Neg liver ROS,   Endo/Other  negative endocrine ROSneg diabetes  Renal/GU negative Renal ROS     Musculoskeletal  (+) Arthritis , Osteoarthritis,    Abdominal (+) + obese,   Peds  Hematology negative hematology ROS (+)   Anesthesia Other Findings Past Medical History:   Hypertension                                                 Anxiety                                                      Arthritis                                                    Cancer ([place])                                    2005           Comment:Prostate   CHF (congestive heart failure) (HCC)                         Shortness of breath dyspnea                                    Comment:on exertion   Hyperlipidemia                                               Peripheral vascular disease ([place])                              Comment:carotid stenosis   Reproductive/Obstetrics  Anesthesia Physical Anesthesia Plan  ASA: III  Anesthesia Plan: General   Post-op Pain Management:    Induction: Intravenous  Airway Management Planned: Oral ETT  Additional Equipment: Arterial line  Intra-op Plan:   Post-operative Plan: Extubation in OR  Informed Consent: I have reviewed the patients History and Physical, chart, labs and discussed the procedure including the risks, benefits and alternatives for the proposed anesthesia with the patient or authorized representative who has indicated his/her understanding and acceptance.     Plan Discussed with:   Anesthesia Plan Comments:         Anesthesia Quick Evaluation

## 2016-07-17 NOTE — Transfer of Care (Signed)
Immediate Anesthesia Transfer of Care Note  Patient: Kyle Whitaker  Procedure(s) Performed: Procedure(s): ENDARTERECTOMY CAROTID (Right)  Patient Location: PACU  Anesthesia Type:General  Level of Consciousness: awake, alert  and responds to stimulation  Airway & Oxygen Therapy: Patient Spontanous Breathing and Patient connected to face mask oxygen  Post-op Assessment: Report given to RN and Post -op Vital signs reviewed and stable  Post vital signs: Reviewed and stable  Last Vitals:  Filed Vitals:   07/17/16 1025 07/17/16 1026  BP: 156/78 156/78  Pulse: 71 72  Temp:    Resp: 14 18    Last Pain: There were no vitals filed for this visit.       Complications: No apparent anesthesia complications

## 2016-07-18 LAB — BASIC METABOLIC PANEL
ANION GAP: 6 (ref 5–15)
BUN: 18 mg/dL (ref 6–20)
CO2: 27 mmol/L (ref 22–32)
Calcium: 8.3 mg/dL — ABNORMAL LOW (ref 8.9–10.3)
Chloride: 107 mmol/L (ref 101–111)
Creatinine, Ser: 1.52 mg/dL — ABNORMAL HIGH (ref 0.61–1.24)
GFR calc Af Amer: 47 mL/min — ABNORMAL LOW (ref >60)
GFR, EST NON AFRICAN AMERICAN: 40 mL/min — AB (ref >60)
Glucose, Bld: 118 mg/dL — ABNORMAL HIGH (ref 65–99)
POTASSIUM: 3.7 mmol/L (ref 3.5–5.1)
SODIUM: 140 mmol/L (ref 135–145)

## 2016-07-18 LAB — CBC
HCT: 35.5 % — ABNORMAL LOW (ref 40.0–52.0)
Hemoglobin: 12.1 g/dL — ABNORMAL LOW (ref 13.0–18.0)
MCH: 31.2 pg (ref 26.0–34.0)
MCHC: 34 g/dL (ref 32.0–36.0)
MCV: 91.7 fL (ref 80.0–100.0)
PLATELETS: 205 10*3/uL (ref 150–440)
RBC: 3.88 MIL/uL — AB (ref 4.40–5.90)
RDW: 15.6 % — ABNORMAL HIGH (ref 11.5–14.5)
WBC: 11.8 10*3/uL — AB (ref 3.8–10.6)

## 2016-07-18 LAB — GLUCOSE, CAPILLARY: GLUCOSE-CAPILLARY: 123 mg/dL — AB (ref 65–99)

## 2016-07-18 MED ORDER — ASPIRIN 81 MG PO TBEC: 81.0000 mg | DELAYED_RELEASE_TABLET | Freq: Every day | ORAL | Status: DC

## 2016-07-18 MED ORDER — CLOPIDOGREL BISULFATE 75 MG PO TABS: 75.0000 mg | ORAL_TABLET | Freq: Every day | ORAL | Status: DC

## 2016-07-18 MED ORDER — FUROSEMIDE 10 MG/ML IJ SOLN: 20.0000 mg | Freq: Once | INTRAMUSCULAR | Status: DC

## 2016-07-18 MED ORDER — TRAMADOL HCL 50 MG PO TABS: 50.0000 mg | ORAL_TABLET | Freq: Four times a day (QID) | ORAL | Status: DC | PRN

## 2016-07-18 NOTE — Discharge Summary (Signed)
Kyle Whitaker    Discharge Summary    Patient ID:  Kyle Whitaker MRN: [id number] DOB/AGE: 09-02-1930 82 y.o.  Admit date: 07/17/2016 Discharge date: 07/18/2016 Date of Surgery: 07/17/2016 Surgeon: Surgeon(s): Katha Cabal, MD  Admission Diagnosis: CAROTID ARTERY STENOSIS  Discharge Diagnoses:  CAROTID ARTERY STENOSIS  Secondary Diagnoses: Past Medical History  Diagnosis Date  . Hypertension   . Anxiety   . Arthritis   . Cancer [facility]) 2005    Prostate  . CHF (congestive heart failure) ([place])   . Shortness of breath dyspnea     on exertion  . Hyperlipidemia   . Peripheral vascular disease ([place])     carotid stenosis    Procedure(s): ENDARTERECTOMY CAROTID  Discharged Condition: good  HPI:  Patient with high grade right ICA stenosis.  Here for elective right CEA  Hospital Course:  Kyle Whitaker is a 82 y.o. male is S/P Right Procedure(s): ENDARTERECTOMY CAROTID Extubated: POD # 0 Physical exam: neuro exam intact Post-op wounds clean, dry, intact or healing well Pt. Ambulating, voiding and taking PO diet without difficulty. Pt pain controlled with PO pain meds. Labs as below Complications:none  Consults:     Significant Diagnostic Studies: CBC Lab Results  Component Value Date   WBC 11.8* 07/18/2016   HGB 12.1* 07/18/2016   HCT 35.5* 07/18/2016   MCV 91.7 07/18/2016   PLT 205 07/18/2016    BMET    Component Value Date/Time   NA 140 07/18/2016 0704   NA 137 11/24/2013 1435   K 3.7 07/18/2016 0704   K 3.5 11/24/2013 1435   CL 107 07/18/2016 0704   CL 104 11/24/2013 1435   CO2 27 07/18/2016 0704   CO2 32 11/24/2013 1435   GLUCOSE 118* 07/18/2016 0704   GLUCOSE 97 11/24/2013 1435   BUN 18 07/18/2016 0704   BUN 16 11/24/2013 1435   CREATININE 1.52* 07/18/2016 0704   CREATININE 1.17 11/24/2013 1435   CALCIUM 8.3* 07/18/2016 0704   CALCIUM 9.0 11/24/2013 1435   GFRNONAA 40* 07/18/2016 0704   GFRNONAA 58*  11/24/2013 1435   GFRAA 47* 07/18/2016 0704   GFRAA >60 11/24/2013 1435   COAG Lab Results  Component Value Date   INR 1.03 07/10/2016   INR 1.14 11/17/2015   INR 1.10 10/14/2015     Disposition:  Discharge to :Home Discharge Instructions    Discharge patient    Complete by:  As directed             Medication List    TAKE these medications        aspirin 81 MG EC tablet  Take 1 tablet (81 mg total) by mouth daily.     atenolol 25 MG tablet  Commonly known as:  TENORMIN  Take 25 mg by mouth daily.     clopidogrel 75 MG tablet  Commonly known as:  PLAVIX  Take 1 tablet (75 mg total) by mouth daily.     diazepam 5 MG tablet  Commonly known as:  VALIUM  Take 1 tablet (5 mg total) by mouth 3 (three) times daily as needed for anxiety.     lisinopril-hydrochlorothiazide 20-25 MG tablet  Commonly known as:  PRINZIDE,ZESTORETIC  Take 1 tablet by mouth daily.     traMADol 50 MG tablet  Commonly known as:  ULTRAM  Take 1 tablet (50 mg total) by mouth every 6 (six) hours as needed for moderate pain.  Verbal and written Discharge instructions given to the patient. Wound care per Discharge AVS     Follow-up Information    Follow up with [facility] A STEGMAYER, PA-C In 2 weeks.   Specialty:  Physician Assistant   Why:  For wound re-check   Contact information:   [place] [address] [phone]       Signed: Leotis Pain, MD  07/18/2016, 1:58 PM

## 2016-07-18 NOTE — Progress Notes (Signed)
[facility]  Daily Progress Note   Subjective  - 1 Day Post-Op  Had a vagal episode earlier, better after that brief episode this am Ate ok Going to walk now Wants to go home  Objective Filed Vitals:   07/18/16 1000 07/18/16 1015 07/18/16 1030 07/18/16 1045  BP: 130/76 146/83 129/82 150/81  Pulse: 82 80 92 87  Temp:      TempSrc:      Resp: 17 13 14 19   Height:      Weight:      SpO2: 98% 98% 100% 98%    Intake/Output Summary (Last 24 hours) at 07/18/16 1355 Last data filed at 07/18/16 1126  Gross per 24 hour  Intake   1375 ml  Output   1500 ml  Net   -125 ml    PULM  CTAB CV  RRR VASC  Neck with minimal swelling, neuro exam appears intact  Laboratory CBC    Component Value Date/Time   WBC 11.8* 07/18/2016 0704   WBC 7.0 11/24/2013 1435   HGB 12.1* 07/18/2016 0704   HGB 13.1 11/24/2013 1435   HCT 35.5* 07/18/2016 0704   HCT 39.5* 11/24/2013 1435   PLT 205 07/18/2016 0704   PLT 287 11/24/2013 1435    BMET    Component Value Date/Time   NA 140 07/18/2016 0704   NA 137 11/24/2013 1435   K 3.7 07/18/2016 0704   K 3.5 11/24/2013 1435   CL 107 07/18/2016 0704   CL 104 11/24/2013 1435   CO2 27 07/18/2016 0704   CO2 32 11/24/2013 1435   GLUCOSE 118* 07/18/2016 0704   GLUCOSE 97 11/24/2013 1435   BUN 18 07/18/2016 0704   BUN 16 11/24/2013 1435   CREATININE 1.52* 07/18/2016 0704   CREATININE 1.17 11/24/2013 1435   CALCIUM 8.3* 07/18/2016 0704   CALCIUM 9.0 11/24/2013 1435   GFRNONAA 40* 07/18/2016 0704   GFRNONAA 58* 11/24/2013 1435   GFRAA 47* 07/18/2016 0704   GFRAA >60 11/24/2013 1435    Assessment/Planning: POD #1 s/p right CEA   Ambulate  Likely discharge this afternoon    Gem Conkle  07/18/2016, 1:55 PM

## 2016-07-18 NOTE — Progress Notes (Signed)
Patient assessment completed this am, no changes from previous assessments. Patient sitting up in chair eating breakfast, no complaints at first, but shortly called nurse back to room because of dizziness. Patient encouraged to lay back in chair and rest. Patient did go unresponsive for approximately 1 minute, pupils fixed and dilated. VS were stable, cbg WDL. Once patient became arousable pupils reactive and able to move upper ext and lower ext with no deficits. Patient currently resting in chair, alert and oriented x4, speech clear, no deficits identified. Dr. Lucky Cowboy notified of change, stated that he thought it may have been a syncopal event and to monitor patient at this time.  Wilnette Kales

## 2016-07-18 NOTE — Progress Notes (Signed)
Patient d/c'd home. Education provided, no questions at this time. Patient picked up by son. Wilnette Kales

## 2016-07-20 LAB — SURGICAL PATHOLOGY

## 2016-07-22 NOTE — Op Note (Signed)
Please see the note which is labeled as an H&P dated 07/17/2016 as this is the op note which is mistakenly filed under the heading of H&P. Note, that this operative report was dictated on the day of surgery as required.

## 2016-09-19 ENCOUNTER — Observation Stay: Payer: Medicare Other

## 2016-09-19 ENCOUNTER — Observation Stay
Admission: EM | Admit: 2016-09-19 | Discharge: 2016-09-20 | Disposition: A | Discharge status: Home / Self Care | Payer: Medicare Other | Attending: Internal Medicine | Admitting: Internal Medicine

## 2016-09-19 ENCOUNTER — Encounter: Payer: Self-pay | Admitting: Internal Medicine

## 2016-09-19 ENCOUNTER — Emergency Department: Payer: Medicare Other

## 2016-09-19 ENCOUNTER — Observation Stay (HOSPITAL_BASED_OUTPATIENT_CLINIC_OR_DEPARTMENT_OTHER)
Admit: 2016-09-19 | Discharge: 2016-09-19 | Disposition: A | Discharge status: Home / Self Care | Payer: Medicare Other | Attending: Internal Medicine | Admitting: Internal Medicine

## 2016-09-19 DIAGNOSIS — M1612 Unilateral primary osteoarthritis, left hip: Secondary | ICD-10-CM | POA: Diagnosis not present

## 2016-09-19 DIAGNOSIS — G459 Transient cerebral ischemic attack, unspecified: Secondary | ICD-10-CM | POA: Diagnosis present

## 2016-09-19 DIAGNOSIS — I110 Hypertensive heart disease with heart failure: Secondary | ICD-10-CM | POA: Insufficient documentation

## 2016-09-19 DIAGNOSIS — I739 Peripheral vascular disease, unspecified: Secondary | ICD-10-CM | POA: Insufficient documentation

## 2016-09-19 DIAGNOSIS — Z7982 Long term (current) use of aspirin: Secondary | ICD-10-CM | POA: Insufficient documentation

## 2016-09-19 DIAGNOSIS — Z87891 Personal history of nicotine dependence: Secondary | ICD-10-CM | POA: Insufficient documentation

## 2016-09-19 DIAGNOSIS — I509 Heart failure, unspecified: Secondary | ICD-10-CM | POA: Diagnosis not present

## 2016-09-19 DIAGNOSIS — Z885 Allergy status to narcotic agent status: Secondary | ICD-10-CM | POA: Insufficient documentation

## 2016-09-19 DIAGNOSIS — F0390 Unspecified dementia without behavioral disturbance: Secondary | ICD-10-CM | POA: Diagnosis not present

## 2016-09-19 DIAGNOSIS — I493 Ventricular premature depolarization: Secondary | ICD-10-CM | POA: Diagnosis not present

## 2016-09-19 DIAGNOSIS — Z96643 Presence of artificial hip joint, bilateral: Secondary | ICD-10-CM | POA: Diagnosis not present

## 2016-09-19 DIAGNOSIS — E785 Hyperlipidemia, unspecified: Secondary | ICD-10-CM | POA: Diagnosis not present

## 2016-09-19 DIAGNOSIS — R410 Disorientation, unspecified: Secondary | ICD-10-CM

## 2016-09-19 DIAGNOSIS — Z8249 Family history of ischemic heart disease and other diseases of the circulatory system: Secondary | ICD-10-CM | POA: Insufficient documentation

## 2016-09-19 DIAGNOSIS — I6521 Occlusion and stenosis of right carotid artery: Secondary | ICD-10-CM | POA: Insufficient documentation

## 2016-09-19 DIAGNOSIS — R0600 Dyspnea, unspecified: Secondary | ICD-10-CM | POA: Diagnosis not present

## 2016-09-19 DIAGNOSIS — I2510 Atherosclerotic heart disease of native coronary artery without angina pectoris: Secondary | ICD-10-CM | POA: Diagnosis not present

## 2016-09-19 DIAGNOSIS — Z8546 Personal history of malignant neoplasm of prostate: Secondary | ICD-10-CM | POA: Insufficient documentation

## 2016-09-19 DIAGNOSIS — F419 Anxiety disorder, unspecified: Secondary | ICD-10-CM | POA: Diagnosis not present

## 2016-09-19 DIAGNOSIS — Z880 Allergy status to penicillin: Secondary | ICD-10-CM | POA: Insufficient documentation

## 2016-09-19 DIAGNOSIS — H5316 Psychophysical visual disturbances: Secondary | ICD-10-CM | POA: Diagnosis not present

## 2016-09-19 DIAGNOSIS — R441 Visual hallucinations: Principal | ICD-10-CM

## 2016-09-19 DIAGNOSIS — G9589 Other specified diseases of spinal cord: Secondary | ICD-10-CM | POA: Insufficient documentation

## 2016-09-19 DIAGNOSIS — Z79899 Other long term (current) drug therapy: Secondary | ICD-10-CM | POA: Insufficient documentation

## 2016-09-19 DIAGNOSIS — E876 Hypokalemia: Secondary | ICD-10-CM | POA: Insufficient documentation

## 2016-09-19 DIAGNOSIS — Z8673 Personal history of transient ischemic attack (TIA), and cerebral infarction without residual deficits: Secondary | ICD-10-CM | POA: Diagnosis not present

## 2016-09-19 DIAGNOSIS — Z7902 Long term (current) use of antithrombotics/antiplatelets: Secondary | ICD-10-CM | POA: Insufficient documentation

## 2016-09-19 DIAGNOSIS — F03A0 Unspecified dementia, mild, without behavioral disturbance, psychotic disturbance, mood disturbance, and anxiety: Secondary | ICD-10-CM

## 2016-09-19 DIAGNOSIS — R443 Hallucinations, unspecified: Secondary | ICD-10-CM | POA: Diagnosis present

## 2016-09-19 LAB — URINALYSIS COMPLETE WITH MICROSCOPIC (ARMC ONLY)
Bacteria, UA: NONE SEEN
Bilirubin Urine: NEGATIVE
Glucose, UA: NEGATIVE mg/dL
KETONES UR: NEGATIVE mg/dL
Leukocytes, UA: NEGATIVE
Nitrite: NEGATIVE
PH: 5 (ref 5.0–8.0)
PROTEIN: NEGATIVE mg/dL
Specific Gravity, Urine: 1.018 (ref 1.005–1.030)

## 2016-09-19 LAB — COMPREHENSIVE METABOLIC PANEL
ALT: 14 U/L — ABNORMAL LOW (ref 17–63)
ANION GAP: 7 (ref 5–15)
AST: 27 U/L (ref 15–41)
Albumin: 3.9 g/dL (ref 3.5–5.0)
Alkaline Phosphatase: 70 U/L (ref 38–126)
BUN: 22 mg/dL — ABNORMAL HIGH (ref 6–20)
CALCIUM: 8.8 mg/dL — AB (ref 8.9–10.3)
CHLORIDE: 102 mmol/L (ref 101–111)
CO2: 30 mmol/L (ref 22–32)
Creatinine, Ser: 1.55 mg/dL — ABNORMAL HIGH (ref 0.61–1.24)
GFR calc non Af Amer: 39 mL/min — ABNORMAL LOW (ref >60)
GFR, EST AFRICAN AMERICAN: 46 mL/min — AB (ref >60)
Glucose, Bld: 102 mg/dL — ABNORMAL HIGH (ref 65–99)
POTASSIUM: 3.3 mmol/L — AB (ref 3.5–5.1)
SODIUM: 139 mmol/L (ref 135–145)
Total Bilirubin: 0.6 mg/dL (ref 0.3–1.2)
Total Protein: 7 g/dL (ref 6.5–8.1)

## 2016-09-19 LAB — CBC
HCT: 36.8 % — ABNORMAL LOW (ref 40.0–52.0)
HEMOGLOBIN: 12.8 g/dL — AB (ref 13.0–18.0)
MCH: 31.6 pg (ref 26.0–34.0)
MCHC: 34.8 g/dL (ref 32.0–36.0)
MCV: 90.7 fL (ref 80.0–100.0)
Platelets: 238 10*3/uL (ref 150–440)
RBC: 4.06 MIL/uL — AB (ref 4.40–5.90)
RDW: 14.5 % (ref 11.5–14.5)
WBC: 8.1 10*3/uL (ref 3.8–10.6)

## 2016-09-19 LAB — TSH: TSH: 2.611 u[IU]/mL (ref 0.350–4.500)

## 2016-09-19 MED ORDER — POTASSIUM CHLORIDE CRYS ER 20 MEQ PO TBCR
40.0000 meq | EXTENDED_RELEASE_TABLET | Freq: Once | ORAL | Status: AC
Start: 2016-09-19 — End: 2016-09-19
  Administered 2016-09-19: 14:00:00 40 meq via ORAL
  Filled 2016-09-19: qty 2

## 2016-09-19 MED ORDER — TRAMADOL HCL 50 MG PO TABS: 50.0000 mg | ORAL_TABLET | Freq: Four times a day (QID) | ORAL | Status: DC | PRN

## 2016-09-19 MED ORDER — ACETAMINOPHEN 325 MG PO TABS: 650.0000 mg | ORAL_TABLET | Freq: Four times a day (QID) | ORAL | Status: DC | PRN

## 2016-09-19 MED ORDER — ONDANSETRON HCL 4 MG PO TABS: 4.0000 mg | ORAL_TABLET | Freq: Four times a day (QID) | ORAL | Status: DC | PRN

## 2016-09-19 MED ORDER — SODIUM CHLORIDE 0.9 % IV SOLN
INTRAVENOUS | Status: DC
  Administered 2016-09-19 – 2016-09-20 (×2): via INTRAVENOUS

## 2016-09-19 MED ORDER — CLOPIDOGREL BISULFATE 75 MG PO TABS
75.0000 mg | ORAL_TABLET | Freq: Every day | ORAL | Status: DC
  Administered 2016-09-19 – 2016-09-20 (×2): 75 mg via ORAL
  Filled 2016-09-19 (×2): qty 1

## 2016-09-19 MED ORDER — RISPERIDONE 0.5 MG PO TBDP: 0.5000 mg | ORAL_TABLET | Freq: Two times a day (BID) | ORAL | Status: DC

## 2016-09-19 MED ORDER — LISINOPRIL 20 MG PO TABS
20.0000 mg | ORAL_TABLET | Freq: Every day | ORAL | Status: DC
  Administered 2016-09-19 – 2016-09-20 (×2): 20 mg via ORAL
  Filled 2016-09-19 (×2): qty 1

## 2016-09-19 MED ORDER — ATENOLOL 25 MG PO TABS
25.0000 mg | ORAL_TABLET | Freq: Every day | ORAL | Status: DC
  Administered 2016-09-19 – 2016-09-20 (×2): 25 mg via ORAL
  Filled 2016-09-19 (×3): qty 1

## 2016-09-19 MED ORDER — HYDRALAZINE HCL 20 MG/ML IJ SOLN: 10.0000 mg | Freq: Four times a day (QID) | INTRAMUSCULAR | Status: DC | PRN

## 2016-09-19 MED ORDER — ONDANSETRON HCL 4 MG/2ML IJ SOLN: 4.0000 mg | Freq: Four times a day (QID) | INTRAMUSCULAR | Status: DC | PRN

## 2016-09-19 MED ORDER — DIAZEPAM 2 MG PO TABS
2.0000 mg | ORAL_TABLET | Freq: Two times a day (BID) | ORAL | Status: DC
  Administered 2016-09-19 – 2016-09-20 (×2): 2 mg via ORAL
  Filled 2016-09-19 (×2): qty 1

## 2016-09-19 MED ORDER — ASPIRIN EC 81 MG PO TBEC
81.0000 mg | DELAYED_RELEASE_TABLET | Freq: Every day | ORAL | Status: DC
  Administered 2016-09-19 – 2016-09-20 (×2): 81 mg via ORAL
  Filled 2016-09-19 (×4): qty 1

## 2016-09-19 MED ORDER — RISPERIDONE 0.5 MG PO TBDP
0.5000 mg | ORAL_TABLET | Freq: Every day | ORAL | Status: DC
  Administered 2016-09-19: 23:00:00 0.5 mg via ORAL
  Filled 2016-09-19: qty 1

## 2016-09-19 MED ORDER — ACETAMINOPHEN 650 MG RE SUPP: 650.0000 mg | Freq: Four times a day (QID) | RECTAL | Status: DC | PRN

## 2016-09-19 MED ORDER — ENOXAPARIN SODIUM 40 MG/0.4ML ~~LOC~~ SOLN
40.0000 mg | SUBCUTANEOUS | Status: DC
  Administered 2016-09-19: 23:00:00 40 mg via SUBCUTANEOUS
  Filled 2016-09-19: qty 0.4

## 2016-09-19 MED ORDER — SODIUM CHLORIDE 0.9% FLUSH
3.0000 mL | Freq: Two times a day (BID) | INTRAVENOUS | Status: DC
  Administered 2016-09-19 – 2016-09-20 (×3): 3 mL via INTRAVENOUS

## 2016-09-19 MED ORDER — AMLODIPINE BESYLATE 5 MG PO TABS
5.0000 mg | ORAL_TABLET | Freq: Every day | ORAL | Status: DC
  Administered 2016-09-19 – 2016-09-20 (×2): 5 mg via ORAL
  Filled 2016-09-19 (×2): qty 1

## 2016-09-19 NOTE — H&P (Signed)
[facility] [place] NAME: Kyle Whitaker    MR#:  [id number]  DATE OF BIRTH:  1930/09/18  DATE OF ADMISSION:  09/19/2016  PRIMARY CARE PHYSICIAN: Maryland Pink, MD   REQUESTING/REFERRING PHYSICIAN: Dr. Marjean Donna  CHIEF COMPLAINT:   Chief Complaint  Patient presents with  . Altered Mental Status    HISTORY OF PRESENT ILLNESS:  Kyle Whitaker  is a 82 y.o. male with a known history of Hypertension, hyperlipidemia, recent carotid endarterectomy of the right carotid artery, congestive heart failure presents to the hospital secondary to worsening hallucinations. Most of the history is obtained from the patient as son is not available at bedside. Patient states that he has been seeing people mostly in the evening, he states he knows that they're not there. Mostly different, unrelated people. He hasn't been able to sleep for the last couple of nights due to the same. No new medications started recently. He has been taking Valium for anxiety as an outpatient. He thinks his symptoms might have worsened after his carotid endarterectomy. Patient is alert oriented at this time and denies any active hallucinations now. Denies any headaches, nausea, vomiting. No tingling, numbness, speech or swallowing difficulties.  PAST MEDICAL HISTORY:   Past Medical History:  Diagnosis Date  . Anxiety   . Arthritis   . Cancer [facility]) 2005   Prostate  . CHF (congestive heart failure) ([place])   . Hyperlipidemia   . Hypertension   . Peripheral vascular disease ([place])    carotid stenosis  . Shortness of breath dyspnea    on exertion    PAST SURGICAL HISTORY:   Past Surgical History:  Procedure Laterality Date  . ENDARTERECTOMY Right 07/17/2016   Procedure: ENDARTERECTOMY CAROTID;  Surgeon: Katha Cabal, MD;  Location: [facility] ORS;  Service: Vascular;  Laterality: Right;  . JOINT REPLACEMENT Right 2011   Total Hip Replacement, [facility]  . JOINT REPLACEMENT Right  2011   Total Knee Replacement, [facility]  . KNEE ARTHROSCOPY Right 2011  . PROSTATE SURGERY    . TOTAL HIP ARTHROPLASTY Left 10/29/2015   Procedure: TOTAL HIP ARTHROPLASTY ANTERIOR APPROACH;  Surgeon: Hessie Knows, MD;  Location: [facility] ORS;  Service: Orthopedics;  Laterality: Left;    SOCIAL HISTORY:   Social History  Substance Use Topics  . Smoking status: Former Smoker    Packs/day: 1.00    Types: Cigarettes  . Smokeless tobacco: Never Used  . Alcohol use No    FAMILY HISTORY:   Family History  Problem Relation Age of Onset  . CAD Mother     DRUG ALLERGIES:   Allergies  Allergen Reactions  . Vicodin [Hydrocodone-Acetaminophen] Shortness Of Breath  . Penicillins Itching    REVIEW OF SYSTEMS:   Review of Systems  Constitutional: Positive for malaise/fatigue. Negative for chills, fever and weight loss.  HENT: Negative for ear discharge, ear pain, hearing loss, nosebleeds and tinnitus.   Eyes: Negative for blurred vision, double vision and photophobia.  Respiratory: Negative for cough, hemoptysis, shortness of breath and wheezing.   Cardiovascular: Negative for chest pain, palpitations, orthopnea and leg swelling.  Gastrointestinal: Negative for abdominal pain, constipation, diarrhea, heartburn, melena, nausea and vomiting.  Genitourinary: Positive for frequency. Negative for dysuria, hematuria and urgency.  Musculoskeletal: Negative for back pain, myalgias and neck pain.  Skin: Negative for rash.  Neurological: Negative for dizziness, tingling, tremors, sensory change, speech change, focal weakness and headaches.  Endo/Heme/Allergies: Does not bruise/bleed easily.  Psychiatric/Behavioral:  Positive for hallucinations. Negative for depression.    MEDICATIONS AT HOME:   Prior to Admission medications   Medication Sig Start Date End Date Taking? Authorizing Provider  aspirin EC 81 MG EC tablet Take 1 tablet (81 mg total) by mouth daily. 07/18/16   Algernon Huxley, MD  atenolol  (TENORMIN) 25 MG tablet Take 25 mg by mouth daily.    Historical Provider, MD  clopidogrel (PLAVIX) 75 MG tablet Take 1 tablet (75 mg total) by mouth daily. 07/18/16   Algernon Huxley, MD  diazepam (VALIUM) 5 MG tablet Take 1 tablet (5 mg total) by mouth 3 (three) times daily as needed for anxiety. 11/01/15   Duanne Guess, PA-C  lisinopril-hydrochlorothiazide (PRINZIDE,ZESTORETIC) 20-25 MG tablet Take 1 tablet by mouth daily. 05/05/16   Historical Provider, MD  traMADol (ULTRAM) 50 MG tablet Take 1 tablet (50 mg total) by mouth every 6 (six) hours as needed for moderate pain. 07/18/16   Algernon Huxley, MD      VITAL SIGNS:  Blood pressure (!) 189/87, pulse 73, temperature 98.4 F (36.9 C), resp. rate 18, height 5\' 9"  (1.753 m), weight 95.3 kg (210 lb), SpO2 99 %.  PHYSICAL EXAMINATION:   Physical Exam  GENERAL:  82 y.o.-year-old patient lying in the bed with no acute distress.  EYES: Pupils equal, round, reactive to light and accommodation. No scleral icterus. Extraocular muscles intact.  HEENT: Head atraumatic, normocephalic. Oropharynx and nasopharynx clear.  NECK:  Supple, no jugular venous distention. No thyroid enlargement, no tenderness.  LUNGS: Normal breath sounds bilaterally, no wheezing, rales,rhonchi or crepitation. No use of accessory muscles of respiration.  CARDIOVASCULAR: S1, S2 normal. No  rubs, or gallops. 2/6 systolic murmur present. ABDOMEN: Soft, nontender, nondistended. Bowel sounds present. No organomegaly or mass.  EXTREMITIES: No pedal edema, cyanosis, or clubbing.  NEUROLOGIC: Cranial nerves II through XII are intact. Muscle strength 5/5 in all extremities. Sensation intact. Gait not checked.  PSYCHIATRIC: The patient is alert and oriented x 3. No hallucinations now SKIN: No obvious rash, lesion, or ulcer.   LABORATORY PANEL:   CBC  Recent Labs Lab 09/19/16 0125  WBC 8.1  HGB 12.8*  HCT 36.8*  PLT 238    ------------------------------------------------------------------------------------------------------------------  Chemistries   Recent Labs Lab 09/19/16 0125  NA 139  K 3.3*  CL 102  CO2 30  GLUCOSE 102*  BUN 22*  CREATININE 1.55*  CALCIUM 8.8*  AST 27  ALT 14*  ALKPHOS 70  BILITOT 0.6   ------------------------------------------------------------------------------------------------------------------  Cardiac Enzymes No results for input(s): TROPONINI in the last 168 hours. ------------------------------------------------------------------------------------------------------------------  RADIOLOGY:  Ct Head Wo Contrast  Result Date: 09/19/2016 CLINICAL DATA:  Visual hallucinations. Similar symptoms previously, worsening today. Recent carotid endarterectomy. History of cancer, hypertension and hyperlipidemia. EXAM: CT HEAD WITHOUT CONTRAST TECHNIQUE: Contiguous axial images were obtained from the base of the skull through the vertex without intravenous contrast. COMPARISON:  MRI of the head February 13, 2011 FINDINGS: BRAIN: The ventricles and sulci are normal for age. No intraparenchymal hemorrhage, mass effect nor midline shift. Patchy supratentorial white matter hypodensities less than expected for patient's age, though non-specific are most compatible with chronic small vessel ischemic disease. Small area mesial RIGHT frontal lobe stable myelomalacia. No acute large vascular territory infarcts. No abnormal extra-axial fluid collections. Basal cisterns are patent. VASCULAR: Severe calcific atherosclerosis of the carotid siphons. SKULL: No skull fracture. No significant scalp soft tissue swelling. SINUSES/ORBITS: The mastoid air-cells and included paranasal sinuses are well-aerated.The included  ocular globes and orbital contents are non-suspicious. OTHER: Patient is edentulous. IMPRESSION: No acute intracranial process. Old small RIGHT frontal lobe infarct, otherwise negative CT  HEAD for age. Electronically Signed   By: Elon Alas M.D.   On: 09/19/2016 02:30   Mr Brain Wo Contrast  Result Date: 09/19/2016 CLINICAL DATA:  Visual hallucinations. Recent carotid endarterectomy. EXAM: MRI HEAD WITHOUT CONTRAST TECHNIQUE: Multiplanar, multiecho pulse sequences of the brain and surrounding structures were obtained without intravenous contrast. COMPARISON:  Head CT 09/19/2016 and MRI 02/13/2011 FINDINGS: Brain: There is no evidence of acute infarct, intracranial hemorrhage, mass, midline shift, or extra-axial fluid collection. Mild cerebral atrophy is within normal limits for age. There are multiple small chronic left cerebellar infarcts which have increased in number from 2012. Chronic infarcts are again seen involving the anterior corpus callosum. Foci of T2 hyperintensity elsewhere in the subcortical and deep cerebral white matter bilaterally are similar to the prior MRI and nonspecific but compatible with mild-to-moderate chronic small vessel ischemic disease. Vascular: Major intracranial vascular flow voids are preserved. Skull and upper cervical spine: Unremarkable bone marrow signal. Sinuses/Orbits: Unremarkable. Other: None. IMPRESSION: 1. No acute intracranial abnormality. 2. Chronic left cerebellar infarcts, increased in number from 2012. 3. Mild-to-moderate chronic small vessel ischemic changes in the cerebral white matter. Electronically Signed   By: Logan Bores M.D.   On: 09/19/2016 10:07   US Carotid Bilateral  Result Date: 09/19/2016 CLINICAL DATA:  TIA and [place] be shin. EXAM: BILATERAL CAROTID DUPLEX ULTRASOUND TECHNIQUE: Pearline Cables scale imaging, color Doppler and duplex ultrasound were performed of bilateral carotid and vertebral arteries in the neck. COMPARISON:  None. FINDINGS: Criteria: Quantification of carotid stenosis is based on velocity parameters that correlate the residual internal carotid diameter with NASCET-based stenosis levels, using the diameter of the  distal internal carotid lumen as the denominator for stenosis measurement. The following velocity measurements were obtained: RIGHT ICA:  68 cm/sec CCA:  0000000 cm/sec SYSTOLIC ICA/CCA RATIO:  0.6 DIASTOLIC ICA/CCA RATIO:  0.9 ECA:  110 cm/sec LEFT ICA:  70 cm/sec CCA:  123456 cm/sec SYSTOLIC ICA/CCA RATIO:  0.6 DIASTOLIC ICA/CCA RATIO:  1.1 ECA:  108 cm/sec RIGHT CAROTID ARTERY: Minimal plaque in the bulb. Low resistance internal carotid Doppler pattern. RIGHT VERTEBRAL ARTERY:  Antegrade. LEFT CAROTID ARTERY: There is focal calcified plaque in the bulb. Low resistance internal carotid Doppler pattern is preserved. LEFT VERTEBRAL ARTERY:  Antegrade. IMPRESSION: Less than 50% stenosis in the right and left internal carotid arteries. Electronically Signed   By: Marybelle Killings M.D.   On: 09/19/2016 11:12    EKG:   Orders placed or performed during the hospital encounter of 09/19/16  . EKG 12-Lead  . EKG 12-Lead    IMPRESSION AND PLAN:   Kyle Whitaker  is a 82 y.o. male with a known history of Hypertension, hyperlipidemia, recent carotid endarterectomy of the right carotid artery, congestive heart failure presents to the hospital secondary to worsening hallucinations.   #1 Hallucinations- MRI with no new infarcts - oriented, could be dementia? - psych consult, discontinue valium, start risperidone tonight and monitor - neuro checks, PT consult - carotid dopplers with no stenosis, ECHO pending  #2 Right carotid stenosis- recent endarterectomy. Continue aspirin and Plavix.  #3 hypertension-continue atenolol. Add Norvasc. Hold lisinopril hydrochlorothiazide  #4 hypokalemia-being replaced  #5 DVT prophylaxis-Lovenox    All the records are reviewed and case discussed with ED provider. Management plans discussed with the patient, family and they are in agreement.  CODE  STATUS: Full code  TOTAL TIME TAKING CARE OF THIS PATIENT: 50 minutes.    Gladstone Lighter M.D on 09/19/2016 at 12:37  PM  Between 7am to 6pm - Pager - [phone]  After 6pm go to [email or web address] - password [email or web address] [facility]  Office  [phone]  CC: Primary care physician; Maryland Pink, MD

## 2016-09-19 NOTE — ED Notes (Signed)
Pt assisted up to void with urinal. Pt steady on feet.

## 2016-09-19 NOTE — ED Triage Notes (Addendum)
Pt brought to triage via wheelchair. Pt reports he is seeing things that are not there. Pt is alert and orientated x 3 and knows he is seeing things that are not there. Pt son reports this has happened several times this week but worse tonight. Pt recently had carotid endarterectomy done.

## 2016-09-19 NOTE — ED Notes (Signed)
Pt and son asleep in room.

## 2016-09-19 NOTE — ED Notes (Signed)
Pt provided with warm blankets. Pt denies hallucinations at this time. Call bell at side. Family no longer in room.

## 2016-09-19 NOTE — ED Notes (Signed)
Patient transported to CT 

## 2016-09-19 NOTE — Consult Note (Signed)
[facility] Face-to-Face Psychiatry Consult   Reason for Consult:  Consult for this 82 year old gentleman without a past psychiatric history who was brought to the hospital because of worsening visual hallucinations. Referring Physician:  Tressia Miners Patient Identification: Kyle Whitaker MRN:  [id number] Principal Diagnosis: Spells of formed visual hallucinations Diagnosis:   Patient Active Problem List   Diagnosis Date Noted  . Hallucinations [R44.3] 09/19/2016  . Mild dementia [F03.90] 09/19/2016  . Spells of formed visual hallucinations [H53.16] 09/19/2016  . Carotid stenosis [I65.29] 07/17/2016  . Shortness of breath [R06.02] 06/28/2016  . Carotid artery stenosis [I65.29]   . Chronic coronary artery disease [I25.10]   . Primary osteoarthritis of left hip [M16.12] 10/29/2015    Total Time spent with patient: 1 hour  Subjective:   Kyle Whitaker is a 82 y.o. male patient admitted with "a little fella came out and got behind the dresser".  HPI:  Patient interviewed. Chart reviewed. Labs and vitals reviewed as well as old chart. Extremely pleasant and engaging gentleman who did a good job of giving a history although his short-term memory is impaired and some of the details may not be right. He tells me that for approximately the last week he has been having episodes of seeing things. It always happens at night in his bedroom. He says on a couple of occasions it seemed like a small little person came out behind the door and hid behind the dresser and then would jump out every now and then at him. He also describes an episode of seeing 2 adult women 1 much larger than the other at his doorway. Also describes an episode of seeing a large overweight man sitting on his bed. Patient admits to being a little upset and confused by this. As far as other psychiatric symptoms however he denies any depression. Denies suicidal or homicidal ideation. Does report being somewhat anxious. He denies having any  auditory hallucinations. The patient claims to me that it's only been happening for the past week or so but then later on describes an episode where something similar happened years ago when he was living at another house. He tells me at one point that he thinks that may be that another episode was due to witchcraft but he doesn't say anything particularly bizarre or delusional about the current set of episodes. Patient is prescribed 5 mg of diazepam 3 times a day and has been for several months presumably for anxiety. He is not on any antipsychotic medicine at home as far as I can tell. Doesn't appear to be on antidepressant medicine. There don't seem to be any other clear new changes to his medicine. Patient expresses satisfaction with his living situation. Denies having any conflict or emotional stress with his son. Patient is clearly very religious but not in a unusual or bizarre manner. I think it significant that he does say that he has been told he has cataracts and has an appointment coming up for eye surgery or at least an evaluation soon.  Social history: Patient is a widower. He lives with his son who is his only child and his son's family. Patient does not report any social conflict at home.  Medical history: Recently had endarterectomy done. According to the chart the son thinks that these current symptoms have been worse since that time. He describes a history of having had prostate cancer in the past. Has chronic arthritis.  Substance abuse history: Patient says he has not had any alcohol in 60  years. Denies any other drug use.  Past Psychiatric History: Patient is not aware of having ever seen a psychiatrist or mental health provider in his life. No history of suicide attempts. No apparent history of other psychiatric treatment except for the current Valium 5 mg 3 times a day. He was not aware of it and couldn't really describe why he was taking it. No history of psychosis.  Risk to Self:  Is patient at risk for suicide?: No Risk to Others:   Prior Inpatient Therapy:   Prior Outpatient Therapy:    Past Medical History:  Past Medical History:  Diagnosis Date  . Anxiety   . Arthritis   . Cancer [facility]) 2005   Prostate  . CHF (congestive heart failure) ([place])   . Hyperlipidemia   . Hypertension   . Peripheral vascular disease ([place])    carotid stenosis  . Shortness of breath dyspnea    on exertion    Past Surgical History:  Procedure Laterality Date  . ENDARTERECTOMY Right 07/17/2016   Procedure: ENDARTERECTOMY CAROTID;  Surgeon: Katha Cabal, MD;  Location: [facility] ORS;  Service: Vascular;  Laterality: Right;  . JOINT REPLACEMENT Right 2011   Total Hip Replacement, [facility]  . JOINT REPLACEMENT Right 2011   Total Knee Replacement, [facility]  . KNEE ARTHROSCOPY Right 2011  . PROSTATE SURGERY    . TOTAL HIP ARTHROPLASTY Left 10/29/2015   Procedure: TOTAL HIP ARTHROPLASTY ANTERIOR APPROACH;  Surgeon: Hessie Knows, MD;  Location: [facility] ORS;  Service: Orthopedics;  Laterality: Left;   Family History:  Family History  Problem Relation Age of Onset  . CAD Mother    Family Psychiatric  History: Patient is unclear about any family history of mental health problems. It sounds like he did not know much about his family of origin growing up. Social History:  History  Alcohol Use No     History  Drug Use No    Social History   Social History  . Marital status: Widowed    Spouse name: N/A  . Number of children: N/A  . Years of education: N/A   Social History Main Topics  . Smoking status: Former Smoker    Packs/day: 1.00    Types: Cigarettes  . Smokeless tobacco: Never Used  . Alcohol use No  . Drug use: No  . Sexual activity: Not Asked   Other Topics Concern  . None   Social History Narrative   Lives with son, has a cane to ambulate   Additional Social History:    Allergies:   Allergies  Allergen Reactions  . Vicodin [Hydrocodone-Acetaminophen] Shortness  Of Breath  . Penicillins Itching    Labs:  Results for orders placed or performed during the hospital encounter of 09/19/16 (from the past 48 hour(s))  Comprehensive metabolic panel     Status: Abnormal   Collection Time: 09/19/16  1:25 AM  Result Value Ref Range   Sodium 139 135 - 145 mmol/L   Potassium 3.3 (L) 3.5 - 5.1 mmol/L   Chloride 102 101 - 111 mmol/L   CO2 30 22 - 32 mmol/L   Glucose, Bld 102 (H) 65 - 99 mg/dL   BUN 22 (H) 6 - 20 mg/dL   Creatinine, Ser 1.55 (H) 0.61 - 1.24 mg/dL   Calcium 8.8 (L) 8.9 - 10.3 mg/dL   Total Protein 7.0 6.5 - 8.1 g/dL   Albumin 3.9 3.5 - 5.0 g/dL   AST 27 15 - 41 U/L   ALT  14 (L) 17 - 63 U/L   Alkaline Phosphatase 70 38 - 126 U/L   Total Bilirubin 0.6 0.3 - 1.2 mg/dL   GFR calc non Af Amer 39 (L) >60 mL/min   GFR calc Af Amer 46 (L) >60 mL/min    Comment: (NOTE) The eGFR has been calculated using the CKD EPI equation. This calculation has not been validated in all clinical situations. eGFR's persistently <60 mL/min signify possible Chronic Kidney Disease.    Anion gap 7 5 - 15  CBC     Status: Abnormal   Collection Time: 09/19/16  1:25 AM  Result Value Ref Range   WBC 8.1 3.8 - 10.6 K/uL   RBC 4.06 (L) 4.40 - 5.90 MIL/uL   Hemoglobin 12.8 (L) 13.0 - 18.0 g/dL   HCT 36.8 (L) 40.0 - 52.0 %   MCV 90.7 80.0 - 100.0 fL   MCH 31.6 26.0 - 34.0 pg   MCHC 34.8 32.0 - 36.0 g/dL   RDW 14.5 11.5 - 14.5 %   Platelets 238 150 - 440 K/uL  TSH     Status: None   Collection Time: 09/19/16  1:25 AM  Result Value Ref Range   TSH 2.611 0.350 - 4.500 uIU/mL  Urinalysis complete, with microscopic ([facility] only)     Status: Abnormal   Collection Time: 09/19/16  3:50 AM  Result Value Ref Range   Color, Urine YELLOW (A) YELLOW   APPearance CLEAR (A) CLEAR   Glucose, UA NEGATIVE NEGATIVE mg/dL   Bilirubin Urine NEGATIVE NEGATIVE   Ketones, ur NEGATIVE NEGATIVE mg/dL   Specific Gravity, Urine 1.018 1.005 - 1.030   Hgb urine dipstick 1+ (A) NEGATIVE    pH 5.0 5.0 - 8.0   Protein, ur NEGATIVE NEGATIVE mg/dL   Nitrite NEGATIVE NEGATIVE   Leukocytes, UA NEGATIVE NEGATIVE   RBC / HPF 0-5 0 - 5 RBC/hpf   WBC, UA 0-5 0 - 5 WBC/hpf   Bacteria, UA NONE SEEN NONE SEEN   Squamous Epithelial / LPF 0-5 (A) NONE SEEN   Mucous PRESENT     Current Facility-Administered Medications  Medication Dose Route Frequency Provider Last Rate Last Dose  . 0.9 %  sodium chloride infusion   Intravenous Continuous Gladstone Lighter, MD 60 mL/hr at 09/19/16 1243    . acetaminophen (TYLENOL) tablet 650 mg  650 mg Oral Q6H PRN Gladstone Lighter, MD       Or  . acetaminophen (TYLENOL) suppository 650 mg  650 mg Rectal Q6H PRN Gladstone Lighter, MD      . amLODipine (NORVASC) tablet 5 mg  5 mg Oral Daily Gladstone Lighter, MD   5 mg at 09/19/16 1408  . aspirin EC tablet 81 mg  81 mg Oral Daily Gladstone Lighter, MD   81 mg at 09/19/16 1241  . atenolol (TENORMIN) tablet 25 mg  25 mg Oral Daily Gladstone Lighter, MD   25 mg at 09/19/16 1241  . clopidogrel (PLAVIX) tablet 75 mg  75 mg Oral Daily Gladstone Lighter, MD   75 mg at 09/19/16 1241  . diazepam (VALIUM) tablet 2 mg  2 mg Oral BID Gonzella Lex, MD      . enoxaparin (LOVENOX) injection 40 mg  40 mg Subcutaneous Q24H Gladstone Lighter, MD      . hydrALAZINE (APRESOLINE) injection 10 mg  10 mg Intravenous Q6H PRN Gladstone Lighter, MD      . lisinopril (PRINIVIL,ZESTRIL) tablet 20 mg  20 mg Oral Daily Gladstone Lighter, MD  20 mg at 09/19/16 1606  . ondansetron (ZOFRAN) tablet 4 mg  4 mg Oral Q6H PRN Gladstone Lighter, MD       Or  . ondansetron (ZOFRAN) injection 4 mg  4 mg Intravenous Q6H PRN Gladstone Lighter, MD      . risperiDONE (RISPERDAL M-TABS) disintegrating tablet 0.5 mg  0.5 mg Oral QHS Tyreak Reagle T Simuel Stebner, MD      . sodium chloride flush (NS) 0.9 % injection 3 mL  3 mL Intravenous Q12H Gladstone Lighter, MD   3 mL at 09/19/16 1245  . traMADol (ULTRAM) tablet 50 mg  50 mg Oral Q6H PRN Gladstone Lighter,  MD        Musculoskeletal: Strength & Muscle Tone: decreased Gait & Station: Not clear. He did not stand up for me and I didn't test it. Patient leans: N/A  Psychiatric Specialty Exam: Physical Exam  Nursing note and vitals reviewed. Constitutional: He appears well-developed and well-nourished.  HENT:  Head: Normocephalic and atraumatic.  Eyes: Conjunctivae are normal. Pupils are equal, round, and reactive to light.  Neck: Normal range of motion.  Cardiovascular: Regular rhythm and normal heart sounds.   Respiratory: Effort normal. No respiratory distress.  GI: Soft.  Musculoskeletal: Normal range of motion.  Neurological: He is alert.  Skin: Skin is warm and dry.  Psychiatric: His speech is normal and behavior is normal. Judgment normal. His mood appears anxious. Thought content is not paranoid. He expresses no homicidal and no suicidal ideation. He exhibits abnormal recent memory. He exhibits normal remote memory.    Review of Systems  Constitutional: Negative.   HENT: Negative.   Eyes: Positive for blurred vision.  Respiratory: Negative.   Cardiovascular: Negative.   Gastrointestinal: Negative.   Musculoskeletal: Negative.   Skin: Negative.   Neurological: Negative.   Psychiatric/Behavioral: Positive for hallucinations and memory loss. Negative for depression, substance abuse and suicidal ideas. The patient is nervous/anxious. The patient does not have insomnia.     Blood pressure (!) 173/89, pulse 69, temperature 98 F (36.7 C), temperature source Oral, resp. rate 18, height 5' 9"  (1.753 m), weight 95.3 kg (210 lb), SpO2 99 %.Body mass index is 31.01 kg/m.  General Appearance: Casual  Eye Contact:  Good  Speech:  Garbled and Normal Rate  Volume:  Normal  Mood:  Euthymic  Affect:  Congruent  Thought Process:  Descriptions of Associations: Tangential  Orientation:  Full (Time, Place, and Person)  Thought Content:  Logical  Suicidal Thoughts:  No  Homicidal Thoughts:   No  Memory:  Immediate;   Good Recent;   Poor Remote;   Fair  Judgement:  Fair  Insight:  Fair  Psychomotor Activity:  Normal  Concentration:  Concentration: Fair  Recall:  Poor  Fund of Knowledge:  Fair  Language:  Good  Akathisia:  No  Handed:  Right  AIMS (if indicated):     Assets:  Communication Skills Desire for Improvement Housing Resilience Social Support  ADL's:  Intact  Cognition:  Impaired,  Mild  Sleep:        Treatment Plan Summary: Daily contact with patient to assess and evaluate symptoms and progress in treatment, Medication management and Plan This is an 82 year old man who has what sounds like relatively recent onset or at least reoccurrence of visual hallucinations. The pattern is of formed visual hallucinations occurring at night time. There is not an accompanying depressive syndrome or psychotic syndrome. He does not have a past history of significant psychiatric problems.  On examination he shows signs of mild dementia. Specifically his short-term memory is very impaired. On the other hand he was able to write a very clear sentence for me and actually passed the figure drawing test. So if he has some dementia is mild at worst. Nevertheless that could be one part of the cause of starting to have visual hallucinations at night. I think another significant possibility is Erasmo Leventhal syndrome or something similar to it. The patient told me he has been diagnosed as having bilateral cataracts and that he actually has an appointment to go get it dealt with. That piece of information and the history would fit pretty well with a syndrome of visual hallucinations that seem to be largely related to visual problems. Additionally the patient clearly has had some an anoxic brain injuries. MRI does not show any major acute injury but does show evidence of old scattered infarcts. It certainly possible that the carotid procedure could have made that slightly worse and that could be  a reason for worsening symptoms. I was able to reassure the patient that his symptoms were not unusual and were not dangerous. I told him that a small amount of risperidone may be of some help with that. I have changed the medication dosage from twice a day to nighttime only since that is when his he is having the symptoms. I have also restarted his Valium. I agree that in a patient who may have a little dementia it's probably a bad idea for them to be on any benzodiazepines but I wouldn't want to cutting from 15 mg a day down to 0 or he is likely to have some withdrawal. I cut it down to just 2 mg twice a day. I don't think there is going to be any indication for psychiatric hospitalization. Thank you for letting me meet this very interesting and pleasant gentleman. I will be happy to follow-up while he is in the hospital.  Disposition: No evidence of imminent risk to self or others at present.   Patient does not meet criteria for psychiatric inpatient admission. Supportive therapy provided about ongoing stressors.  Alethia Berthold, MD 09/19/2016 4:56 PM

## 2016-09-19 NOTE — Progress Notes (Signed)
*  PRELIMINARY RESULTS* Echocardiogram 2D Echocardiogram has been performed. Attempted Bubble Study with limited IV access in the hand, Charge RN tried to gain IV access and was unsuccessful.  Kyle Whitaker Stills 09/19/2016, 11:51 AM

## 2016-09-19 NOTE — Care Management Obs Status (Signed)
[place] NOTIFICATION   Patient Details  Name: Kyle Whitaker MRN: [id number] Date of Birth: 07/23/1930   Medicare Observation Status Notification Given:  Yes (patient's son signed)    Ival Bible, RN 09/19/2016, 7:42 AM

## 2016-09-19 NOTE — ED Notes (Signed)
Informed RN bed ready 

## 2016-09-19 NOTE — Progress Notes (Signed)
Pt transported to MRI 

## 2016-09-19 NOTE — ED Notes (Signed)
Pt returned from ct, pt denies further needs.

## 2016-09-19 NOTE — ED Notes (Signed)
Pt. Resting comfortably in bed, son at the bedside. Pt. Denies complaints or concerns at this time.

## 2016-09-19 NOTE — Progress Notes (Signed)
Pt's belongings sent home with pt's son.  Items given to son included $2.95 in coins, 4 mints, fingernail clippers, 2 sets of keys, 1 pair of black pants, belt and shoes.  Pt also had bottle of valium 5 mg in his pocket on arrival to room.  Bottle contained 10 pills.  This medication was also given to pt's son to take home.  Clarise Cruz, RN

## 2016-09-19 NOTE — ED Notes (Signed)
Pt sleeping. 

## 2016-09-19 NOTE — ED Notes (Signed)
Report to don, rn.  

## 2016-09-19 NOTE — Progress Notes (Signed)
PT Cancellation Note  Patient Details Name: Kyle Whitaker MRN: [id number] DOB: May 18, 1930   Cancelled Treatment:    Reason Eval/Treat Not Completed: Patient at procedure or test/unavailable.  Try later as time and pt allow.   [name] 09/19/2016, 12:34 PM    Mee Hives, PT MS Acute Rehab Dept. Number: [place] [facility] [place]

## 2016-09-20 DIAGNOSIS — H5316 Psychophysical visual disturbances: Secondary | ICD-10-CM | POA: Diagnosis not present

## 2016-09-20 DIAGNOSIS — R441 Visual hallucinations: Secondary | ICD-10-CM | POA: Diagnosis not present

## 2016-09-20 LAB — CBC
HCT: 36.9 % — ABNORMAL LOW (ref 40.0–52.0)
HEMOGLOBIN: 12.6 g/dL — AB (ref 13.0–18.0)
MCH: 30.9 pg (ref 26.0–34.0)
MCHC: 34.1 g/dL (ref 32.0–36.0)
MCV: 90.5 fL (ref 80.0–100.0)
Platelets: 221 10*3/uL (ref 150–440)
RBC: 4.08 MIL/uL — AB (ref 4.40–5.90)
RDW: 14.3 % (ref 11.5–14.5)
WBC: 6.7 10*3/uL (ref 3.8–10.6)

## 2016-09-20 LAB — BASIC METABOLIC PANEL
ANION GAP: 3 — AB (ref 5–15)
BUN: 15 mg/dL (ref 6–20)
CALCIUM: 8.5 mg/dL — AB (ref 8.9–10.3)
CO2: 30 mmol/L (ref 22–32)
Chloride: 106 mmol/L (ref 101–111)
Creatinine, Ser: 1.17 mg/dL (ref 0.61–1.24)
GFR, EST NON AFRICAN AMERICAN: 55 mL/min — AB (ref >60)
Glucose, Bld: 94 mg/dL (ref 65–99)
Potassium: 3.6 mmol/L (ref 3.5–5.1)
Sodium: 139 mmol/L (ref 135–145)

## 2016-09-20 LAB — VITAMIN B12: Vitamin B-12: 387 pg/mL (ref 180–914)

## 2016-09-20 MED ORDER — AMLODIPINE BESYLATE 5 MG PO TABS: 5.0000 mg | ORAL_TABLET | Freq: Every day | ORAL | 2 refills | Status: DC

## 2016-09-20 MED ORDER — DIAZEPAM 2 MG PO TABS: 2.0000 mg | ORAL_TABLET | Freq: Two times a day (BID) | ORAL | 0 refills | Status: DC

## 2016-09-20 MED ORDER — RISPERIDONE 0.5 MG PO TBDP: 0.5000 mg | ORAL_TABLET | Freq: Every day | ORAL | 0 refills | Status: DC

## 2016-09-20 NOTE — Care Management Note (Signed)
Case Management Note  Patient Details  Name: Kyle Whitaker MRN: [id number] Date of Birth: September 20, 1930  Subjective/Objective:      Discussed discharge planning with [name]. He chose [facility] to be his provider from a list of providers. He reported that he had had home health in the past but could not remember the name of the provider. He stated that he only wants 3 sessions of PT at home. This writer advised him to discuss that with the [facility] physical therapist and he agreed. [name] seemed mildly confused but responded to simple questions appropriately.               Action/Plan:   Expected Discharge Date:                  Expected Discharge Plan:     In-House Referral:     Discharge planning Services     Post Acute Care Choice:    Choice offered to:     DME Arranged:    DME Agency:     HH Arranged:    HH Agency:     Status of Service:     If discussed at [employer] of Stay Meetings, dates discussed:    Additional Comments:  Lulie Hurd A, RN 09/20/2016, 3:33 PM

## 2016-09-20 NOTE — Evaluation (Signed)
Physical Therapy Evaluation Patient Details Name: Kyle Whitaker MRN: [id number] DOB: 04-Mar-1930 Today's Date: 09/20/2016   History of Present Illness  Patient is an 82 y/o male that presents with visual hallucinations. History of hip replacements.   Clinical Impression  Patient admitted with visual hallucinations, appears to have had episode of this in the past and may be related to post-surgical side effects from procedure several weeks previous. He is quite slow with all movements, but physically can complete without PT assistance. He is able to ambulate a lap around RN station without PT assistance physically, just for guidance. His short strides, poor placement of RW, and shuffling gait are all indicative of high falls risk. He would certainly benefit from HHPT to address balance deficits noted in this session. Cognitively he appears to understand commands, though what the retention level instruction would have is difficult to determine as he mutters throughout session.     Follow Up Recommendations Home health PT    Equipment Recommendations  Rolling walker with 5" wheels    Recommendations for Other Services       Precautions / Restrictions Precautions Precautions: Fall Restrictions Weight Bearing Restrictions: No      Mobility  Bed Mobility Overal bed mobility: Needs Assistance Bed Mobility: Supine to Sit     Supine to sit: Min guard     General bed mobility comments: No physical assistance required, however prolonged time needed to complete transfer.   Transfers Overall transfer level: Needs assistance Equipment used: Rolling walker (2 wheeled) Transfers: Sit to/from Stand Sit to Stand: Min guard         General transfer comment: No physical assistance required, cuing to use handrails on RW and slow transfer noted.   Ambulation/Gait Ambulation/Gait assistance: Min guard Ambulation Distance (Feet): 200 Feet Assistive device: Rolling walker (2 wheeled) Gait  Pattern/deviations: Decreased step length - right;Decreased step length - left;Trunk flexed;Shuffle;Narrow base of support   Gait velocity interpretation: <1.8 ft/sec, indicative of risk for recurrent falls General Gait Details: Patient noted to have RW anterior to COM, unable to bring COM in RW due to kyphotic posture. Shuffling gait, indicative of high falls risk.   Stairs            Wheelchair Mobility    Modified Rankin (Stroke Patients Only)       Balance Overall balance assessment: Needs assistance Sitting-balance support: Bilateral upper extremity supported Sitting balance-Leahy Scale: Fair     Standing balance support: Bilateral upper extremity supported Standing balance-Leahy Scale: Fair                               Pertinent Vitals/Pain Pain Assessment: No/denies pain    Home Living Family/patient expects to be discharged to:: Private residence Living Arrangements: Children Available Help at Discharge: Family Type of Home: House Home Access: Stairs to enter Entrance Stairs-Rails: Can reach both Entrance Stairs-Number of Steps: 1 Home Layout: One level Home Equipment: Environmental consultant - 2 wheels;Walker - 4 wheels;Shower seat;Wheelchair - manual;Grab bars - tub/shower      Prior Function Level of Independence: Independent with assistive device(s)         Comments: reports getting some assist with bathing from son (from previous encounter)      Hand Dominance   Dominant Hand: Right    Extremity/Trunk Assessment   Upper Extremity Assessment: Generalized weakness           Lower Extremity Assessment: Generalized weakness  Communication   Communication:  (Patient mutters almost constantly, difficult to track train of thought.)  Cognition Arousal/Alertness: Awake/alert Behavior During Therapy: WFL for tasks assessed/performed Overall Cognitive Status: History of cognitive impairments - at baseline (Mutters almost constantly,  difficult to follow and understand)                      General Comments      Exercises     Assessment/Plan    PT Assessment Patient needs continued PT services  PT Problem List Decreased strength;Decreased mobility;Decreased activity tolerance;Decreased cognition;Decreased balance;Decreased knowledge of use of DME          PT Treatment Interventions DME instruction;Gait training;Therapeutic exercise;Therapeutic activities;Stair training;Balance training    PT Goals (Current goals can be found in the Care Plan section)  Acute Rehab PT Goals Patient Stated Goal: To go home  PT Goal Formulation: With patient Time For Goal Achievement: 10/04/16 Potential to Achieve Goals: Good    Frequency Min 2X/week   Barriers to discharge        Co-evaluation               End of Session Equipment Utilized During Treatment: Gait belt Activity Tolerance: Patient tolerated treatment well Patient left: in chair;with chair alarm set;with call bell/phone within reach Nurse Communication: Mobility status    Functional Assessment Tool Used: Clinical judgement  Functional Limitation: Mobility: Walking and moving around Mobility: Walking and Moving Around Current Status [phone]): At least 1 percent but less than 20 percent impaired, limited or restricted Mobility: Walking and Moving Around Goal Status [phone]): At least 1 percent but less than 20 percent impaired, limited or restricted    Time: [id number] PT Time Calculation (min) (ACUTE ONLY): 15 min   Charges:   PT Evaluation $PT Eval Moderate Complexity: 1 Procedure     PT G Codes:   PT G-Codes **NOT FOR INPATIENT CLASS** Functional Assessment Tool Used: Clinical judgement  Functional Limitation: Mobility: Walking and moving around Mobility: Walking and Moving Around Current Status [id number]): At least 1 percent but less than 20 percent impaired, limited or restricted Mobility: Walking and Moving Around Goal Status  [phone]): At least 1 percent but less than 20 percent impaired, limited or restricted   Kerman Passey, PT, DPT    09/20/2016, 11:38 AM

## 2016-09-20 NOTE — Consult Note (Signed)
[facility] Face-to-Face Psychiatry Consult   Reason for Consult:  Consult for this 82 year old gentleman without a past psychiatric history who was brought to the hospital because of worsening visual hallucinations. Referring Physician:  Tressia Miners Patient Identification: Kyle Whitaker MRN:  [id number] Principal Diagnosis: Spells of formed visual hallucinations Diagnosis:   Patient Active Problem List   Diagnosis Date Noted  . Hallucinations [R44.3] 09/19/2016  . Mild dementia [F03.90] 09/19/2016  . Spells of formed visual hallucinations [H53.16] 09/19/2016  . Carotid stenosis [I65.29] 07/17/2016  . Shortness of breath [R06.02] 06/28/2016  . Carotid artery stenosis [I65.29]   . Chronic coronary artery disease [I25.10]   . Primary osteoarthritis of left hip [M16.12] 10/29/2015    Total Time spent with patient: 20 minutes  Subjective:   Kyle Whitaker is a 82 y.o. male patient admitted with "a little fella came out and got behind the dresser".  Patient interviewed. Chart reviewed. Spoke with nursing. Patient has no new complaints. He tells me that he thinks he slept well. Nursing reports that there was a report that he was still having visual hallucinations during the night but not during the day. He did not become agitated or show any difficulty with management. Patient states his mood is feeling okay today. He is feeling physical fine. Nursing does tell me that they discovered that his Valium bottle was in his possession and appeared to be missing perhaps a larger number of tablets then appropriate.  HPI:  Patient interviewed. Chart reviewed. Labs and vitals reviewed as well as old chart. Extremely pleasant and engaging gentleman who did a good job of giving a history although his short-term memory is impaired and some of the details may not be right. He tells me that for approximately the last week he has been having episodes of seeing things. It always happens at night in his bedroom. He says on  a couple of occasions it seemed like a small little person came out behind the door and hid behind the dresser and then would jump out every now and then at him. He also describes an episode of seeing 2 adult women 1 much larger than the other at his doorway. Also describes an episode of seeing a large overweight man sitting on his bed. Patient admits to being a little upset and confused by this. As far as other psychiatric symptoms however he denies any depression. Denies suicidal or homicidal ideation. Does report being somewhat anxious. He denies having any auditory hallucinations. The patient claims to me that it's only been happening for the past week or so but then later on describes an episode where something similar happened years ago when he was living at another house. He tells me at one point that he thinks that may be that another episode was due to witchcraft but he doesn't say anything particularly bizarre or delusional about the current set of episodes. Patient is prescribed 5 mg of diazepam 3 times a day and has been for several months presumably for anxiety. He is not on any antipsychotic medicine at home as far as I can tell. Doesn't appear to be on antidepressant medicine. There don't seem to be any other clear new changes to his medicine. Patient expresses satisfaction with his living situation. Denies having any conflict or emotional stress with his son. Patient is clearly very religious but not in a unusual or bizarre manner. I think it significant that he does say that he has been told he has cataracts and has  an appointment coming up for eye surgery or at least an evaluation soon.  Social history: Patient is a widower. He lives with his son who is his only child and his son's family. Patient does not report any social conflict at home.  Medical history: Recently had endarterectomy done. According to the chart the son thinks that these current symptoms have been worse since that time. He  describes a history of having had prostate cancer in the past. Has chronic arthritis.  Substance abuse history: Patient says he has not had any alcohol in 60 years. Denies any other drug use.  Past Psychiatric History: Patient is not aware of having ever seen a psychiatrist or mental health provider in his life. No history of suicide attempts. No apparent history of other psychiatric treatment except for the current Valium 5 mg 3 times a day. He was not aware of it and couldn't really describe why he was taking it. No history of psychosis.  Risk to Self: Is patient at risk for suicide?: No Risk to Others:   Prior Inpatient Therapy:   Prior Outpatient Therapy:    Past Medical History:  Past Medical History:  Diagnosis Date  . Anxiety   . Arthritis   . Cancer [facility]) 2005   Prostate  . CHF (congestive heart failure) ([place])   . Hyperlipidemia   . Hypertension   . Peripheral vascular disease ([place])    carotid stenosis  . Shortness of breath dyspnea    on exertion    Past Surgical History:  Procedure Laterality Date  . ENDARTERECTOMY Right 07/17/2016   Procedure: ENDARTERECTOMY CAROTID;  Surgeon: Katha Cabal, MD;  Location: [facility] ORS;  Service: Vascular;  Laterality: Right;  . JOINT REPLACEMENT Right 2011   Total Hip Replacement, [facility]  . JOINT REPLACEMENT Right 2011   Total Knee Replacement, [facility]  . KNEE ARTHROSCOPY Right 2011  . PROSTATE SURGERY    . TOTAL HIP ARTHROPLASTY Left 10/29/2015   Procedure: TOTAL HIP ARTHROPLASTY ANTERIOR APPROACH;  Surgeon: Hessie Knows, MD;  Location: [facility] ORS;  Service: Orthopedics;  Laterality: Left;   Family History:  Family History  Problem Relation Age of Onset  . CAD Mother    Family Psychiatric  History: Patient is unclear about any family history of mental health problems. It sounds like he did not know much about his family of origin growing up. Social History:  History  Alcohol Use No     History  Drug Use No    Social History    Social History  . Marital status: Widowed    Spouse name: N/A  . Number of children: N/A  . Years of education: N/A   Social History Main Topics  . Smoking status: Former Smoker    Packs/day: 1.00    Types: Cigarettes  . Smokeless tobacco: Never Used  . Alcohol use No  . Drug use: No  . Sexual activity: Not Asked   Other Topics Concern  . None   Social History Narrative   Lives with son, has a cane to ambulate   Additional Social History:    Allergies:   Allergies  Allergen Reactions  . Vicodin [Hydrocodone-Acetaminophen] Shortness Of Breath  . Penicillins Itching    Labs:  Results for orders placed or performed during the hospital encounter of 09/19/16 (from the past 48 hour(s))  Comprehensive metabolic panel     Status: Abnormal   Collection Time: 09/19/16  1:25 AM  Result Value Ref Range   Sodium  139 135 - 145 mmol/L   Potassium 3.3 (L) 3.5 - 5.1 mmol/L   Chloride 102 101 - 111 mmol/L   CO2 30 22 - 32 mmol/L   Glucose, Bld 102 (H) 65 - 99 mg/dL   BUN 22 (H) 6 - 20 mg/dL   Creatinine, Ser 1.55 (H) 0.61 - 1.24 mg/dL   Calcium 8.8 (L) 8.9 - 10.3 mg/dL   Total Protein 7.0 6.5 - 8.1 g/dL   Albumin 3.9 3.5 - 5.0 g/dL   AST 27 15 - 41 U/L   ALT 14 (L) 17 - 63 U/L   Alkaline Phosphatase 70 38 - 126 U/L   Total Bilirubin 0.6 0.3 - 1.2 mg/dL   GFR calc non Af Amer 39 (L) >60 mL/min   GFR calc Af Amer 46 (L) >60 mL/min    Comment: (NOTE) The eGFR has been calculated using the CKD EPI equation. This calculation has not been validated in all clinical situations. eGFR's persistently <60 mL/min signify possible Chronic Kidney Disease.    Anion gap 7 5 - 15  CBC     Status: Abnormal   Collection Time: 09/19/16  1:25 AM  Result Value Ref Range   WBC 8.1 3.8 - 10.6 K/uL   RBC 4.06 (L) 4.40 - 5.90 MIL/uL   Hemoglobin 12.8 (L) 13.0 - 18.0 g/dL   HCT 36.8 (L) 40.0 - 52.0 %   MCV 90.7 80.0 - 100.0 fL   MCH 31.6 26.0 - 34.0 pg   MCHC 34.8 32.0 - 36.0 g/dL   RDW  14.5 11.5 - 14.5 %   Platelets 238 150 - 440 K/uL  TSH     Status: None   Collection Time: 09/19/16  1:25 AM  Result Value Ref Range   TSH 2.611 0.350 - 4.500 uIU/mL  Vitamin B12     Status: None   Collection Time: 09/19/16  1:25 AM  Result Value Ref Range   Vitamin B-12 387 180 - 914 pg/mL    Comment: (NOTE) This assay is not validated for testing neonatal or myeloproliferative syndrome specimens for Vitamin B12 levels. Performed at [facility]   Urinalysis complete, with microscopic ([facility] only)     Status: Abnormal   Collection Time: 09/19/16  3:50 AM  Result Value Ref Range   Color, Urine YELLOW (A) YELLOW   APPearance CLEAR (A) CLEAR   Glucose, UA NEGATIVE NEGATIVE mg/dL   Bilirubin Urine NEGATIVE NEGATIVE   Ketones, ur NEGATIVE NEGATIVE mg/dL   Specific Gravity, Urine 1.018 1.005 - 1.030   Hgb urine dipstick 1+ (A) NEGATIVE   pH 5.0 5.0 - 8.0   Protein, ur NEGATIVE NEGATIVE mg/dL   Nitrite NEGATIVE NEGATIVE   Leukocytes, UA NEGATIVE NEGATIVE   RBC / HPF 0-5 0 - 5 RBC/hpf   WBC, UA 0-5 0 - 5 WBC/hpf   Bacteria, UA NONE SEEN NONE SEEN   Squamous Epithelial / LPF 0-5 (A) NONE SEEN   Mucous PRESENT   Basic metabolic panel     Status: Abnormal   Collection Time: 09/20/16  5:06 AM  Result Value Ref Range   Sodium 139 135 - 145 mmol/L   Potassium 3.6 3.5 - 5.1 mmol/L   Chloride 106 101 - 111 mmol/L   CO2 30 22 - 32 mmol/L   Glucose, Bld 94 65 - 99 mg/dL   BUN 15 6 - 20 mg/dL   Creatinine, Ser 1.17 0.61 - 1.24 mg/dL   Calcium 8.5 (L) 8.9 - 10.3   mg/dL   GFR calc non Af Amer 55 (L) >60 mL/min   GFR calc Af Amer >60 >60 mL/min    Comment: (NOTE) The eGFR has been calculated using the CKD EPI equation. This calculation has not been validated in all clinical situations. eGFR's persistently <60 mL/min signify possible Chronic Kidney Disease.    Anion gap 3 (L) 5 - 15  CBC     Status: Abnormal   Collection Time: 09/20/16  5:06 AM  Result Value Ref Range   WBC  6.7 3.8 - 10.6 K/uL   RBC 4.08 (L) 4.40 - 5.90 MIL/uL   Hemoglobin 12.6 (L) 13.0 - 18.0 g/dL   HCT 36.9 (L) 40.0 - 52.0 %   MCV 90.5 80.0 - 100.0 fL   MCH 30.9 26.0 - 34.0 pg   MCHC 34.1 32.0 - 36.0 g/dL   RDW 14.3 11.5 - 14.5 %   Platelets 221 150 - 440 K/uL    Current Facility-Administered Medications  Medication Dose Route Frequency Provider Last Rate Last Dose  . acetaminophen (TYLENOL) tablet 650 mg  650 mg Oral Q6H PRN Radhika Kalisetti, MD       Or  . acetaminophen (TYLENOL) suppository 650 mg  650 mg Rectal Q6H PRN Radhika Kalisetti, MD      . amLODipine (NORVASC) tablet 5 mg  5 mg Oral Daily Radhika Kalisetti, MD   5 mg at 09/20/16 1039  . aspirin EC tablet 81 mg  81 mg Oral Daily Radhika Kalisetti, MD   81 mg at 09/20/16 1039  . atenolol (TENORMIN) tablet 25 mg  25 mg Oral Daily Radhika Kalisetti, MD   25 mg at 09/20/16 1039  . clopidogrel (PLAVIX) tablet 75 mg  75 mg Oral Daily Radhika Kalisetti, MD   75 mg at 09/20/16 1039  . diazepam (VALIUM) tablet 2 mg  2 mg Oral BID  T , MD   2 mg at 09/20/16 1039  . enoxaparin (LOVENOX) injection 40 mg  40 mg Subcutaneous Q24H Radhika Kalisetti, MD   40 mg at 09/19/16 2241  . hydrALAZINE (APRESOLINE) injection 10 mg  10 mg Intravenous Q6H PRN Radhika Kalisetti, MD      . lisinopril (PRINIVIL,ZESTRIL) tablet 20 mg  20 mg Oral Daily Radhika Kalisetti, MD   20 mg at 09/20/16 1039  . ondansetron (ZOFRAN) tablet 4 mg  4 mg Oral Q6H PRN Radhika Kalisetti, MD       Or  . ondansetron (ZOFRAN) injection 4 mg  4 mg Intravenous Q6H PRN Radhika Kalisetti, MD      . risperiDONE (RISPERDAL M-TABS) disintegrating tablet 0.5 mg  0.5 mg Oral QHS  T , MD   0.5 mg at 09/19/16 2241  . sodium chloride flush (NS) 0.9 % injection 3 mL  3 mL Intravenous Q12H Radhika Kalisetti, MD   3 mL at 09/20/16 1045  . traMADol (ULTRAM) tablet 50 mg  50 mg Oral Q6H PRN Radhika Kalisetti, MD        Musculoskeletal: Strength & Muscle Tone:  decreased Gait & Station: Not clear. He did not stand up for me and I didn't test it. Patient leans: N/A  Psychiatric Specialty Exam: Physical Exam  Nursing note and vitals reviewed. Constitutional: He appears well-developed and well-nourished.  HENT:  Head: Normocephalic and atraumatic.  Eyes: Conjunctivae are normal. Pupils are equal, round, and reactive to light.  Neck: Normal range of motion.  Cardiovascular: Regular rhythm and normal heart sounds.   Respiratory: Effort normal. No respiratory distress.    GI: Soft.  Musculoskeletal: Normal range of motion.  Neurological: He is alert.  Skin: Skin is warm and dry.  Psychiatric: His speech is normal and behavior is normal. Judgment normal. His mood appears not anxious. Thought content is not paranoid. He expresses no homicidal and no suicidal ideation. He exhibits abnormal recent memory. He exhibits normal remote memory.    Review of Systems  Constitutional: Negative.   HENT: Negative.   Eyes: Positive for blurred vision.  Respiratory: Negative.   Cardiovascular: Negative.   Gastrointestinal: Negative.   Musculoskeletal: Negative.   Skin: Negative.   Neurological: Negative.   Psychiatric/Behavioral: Positive for hallucinations and memory loss. Negative for depression, substance abuse and suicidal ideas. The patient is nervous/anxious. The patient does not have insomnia.     Blood pressure (!) 162/82, pulse (!) 41, temperature 98 F (36.7 C), temperature source Oral, resp. rate 20, height 5' 9" (1.753 m), weight 95.3 kg (210 lb), SpO2 99 %.Body mass index is 31.01 kg/m.  General Appearance: Casual  Eye Contact:  Good  Speech:  Garbled and Normal Rate  Volume:  Normal  Mood:  Euthymic  Affect:  Congruent  Thought Process:  Descriptions of Associations: Tangential  Orientation:  Full (Time, Place, and Person)  Thought Content:  Logical  Suicidal Thoughts:  No  Homicidal Thoughts:  No  Memory:  Immediate;   Good Recent;    Poor Remote;   Fair  Judgement:  Fair  Insight:  Fair  Psychomotor Activity:  Normal  Concentration:  Concentration: Fair  Recall:  Poor  Fund of Knowledge:  Fair  Language:  Good  Akathisia:  No  Handed:  Right  AIMS (if indicated):     Assets:  Communication Skills Desire for Improvement Housing Resilience Social Support  ADL's:  Intact  Cognition:  Impaired,  Mild  Sleep:        Treatment Plan Summary: Daily contact with patient to assess and evaluate symptoms and progress in treatment, Medication management and Plan No change to treatment plan or assessment. 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 Recommend continuing the low dose of Risperdal. Also recommend as I had ordered a lower dose of the Valium. Ultimately it would be best to get him off this entirely. I explained to the patient how benzodiazepines actually cause memory impairment and make dementia worse. He seems to be doing fine on the lower dose although I do see that his blood pressure was high this morning. No sign of tremor however. Patient does not need inpatient psychiatric treatment. Follow-up with outpatient providers.  Disposition: No evidence of imminent risk to self or others at present.   Patient does not meet criteria for psychiatric inpatient admission. Supportive therapy provided about ongoing stressors.   , MD 09/20/2016 2:51 PM 

## 2016-09-20 NOTE — Progress Notes (Signed)
Discussed discharge instructions and medications with pt and his son. IV removed. All questions addressed. Pt transported home via car by his son.

## 2016-09-22 NOTE — Discharge Summary (Signed)
[facility] NAME: Kyle Whitaker    MR#:  [id number]  DATE OF BIRTH:  08/10/1930  DATE OF ADMISSION:  09/19/2016   ADMITTING PHYSICIAN: Gladstone Lighter, MD  DATE OF DISCHARGE: 09/20/2016  4:33 PM  PRIMARY CARE PHYSICIAN: Maryland Pink, MD   ADMISSION DIAGNOSIS:   TIA (transient ischemic attack) [G45.9]  DISCHARGE DIAGNOSIS:   Principal Problem:   Spells of formed visual hallucinations Active Problems:   Hallucinations   Mild dementia   SECONDARY DIAGNOSIS:   Past Medical History:  Diagnosis Date  . Anxiety   . Arthritis   . Cancer [facility]) 2005   Prostate  . CHF (congestive heart failure) ([place])   . Hyperlipidemia   . Hypertension   . Peripheral vascular disease ([place])    carotid stenosis  . Shortness of breath dyspnea    on exertion    HOSPITAL COURSE:   [name]  is a 82 y.o. male with a known history of Hypertension, hyperlipidemia, recent carotid endarterectomy of the right carotid artery, congestive heart failure presents to the hospital secondary to worsening hallucinations.         #1 Hallucinations- MRI with no new infarcts -Appreciate psychiatry consult. Likely from his cataracts and decreased vision causing visual disturbances and hallucinations-reactive -Decreased his Valium dose as outpatient. Also started on risperidone at bedtime -Carotid Dopplers with no hemodynamically significant stenosis. Echo is normal. -Worked well with physical therapy. Being discharged home with home health  #2 Right carotid stenosis- recent endarterectomy. Continue aspirin and Plavix.  #3 hypertension-blood pressure was elevated in the hospital. continue atenolol, lisinopril hydrochlorothiazide. Added Norvasc.   #4 hypokalemia-replaced  Patient stable and being discharged home with home health   DISCHARGE CONDITIONS:   Guarded  CONSULTS OBTAINED:   Treatment Team:  Gonzella Lex, MD  DRUG ALLERGIES:    Allergies  Allergen Reactions  . Vicodin [Hydrocodone-Acetaminophen] Shortness Of Breath  . Penicillins Itching   DISCHARGE MEDICATIONS:     Medication List    TAKE these medications   amLODipine 5 MG tablet Commonly known as:  NORVASC Take 1 tablet (5 mg total) by mouth daily.   aspirin 81 MG EC tablet Take 1 tablet (81 mg total) by mouth daily.   atenolol 25 MG tablet Commonly known as:  TENORMIN Take 25 mg by mouth daily.   clopidogrel 75 MG tablet Commonly known as:  PLAVIX Take 1 tablet (75 mg total) by mouth daily.   diazepam 2 MG tablet Commonly known as:  VALIUM Take 1 tablet (2 mg total) by mouth 2 (two) times daily. What changed:  medication strength  how much to take  when to take this  reasons to take this   lisinopril-hydrochlorothiazide 20-25 MG tablet Commonly known as:  PRINZIDE,ZESTORETIC Take 1 tablet by mouth daily.   risperiDONE 0.5 MG disintegrating tablet Commonly known as:  RISPERDAL M-TABS Take 1 tablet (0.5 mg total) by mouth at bedtime.   traMADol 50 MG tablet Commonly known as:  ULTRAM Take 1 tablet (50 mg total) by mouth every 6 (six) hours as needed for moderate pain.        DISCHARGE INSTRUCTIONS:   1. PCP follow-up in 1 week 2 ophthalmology follow-up as scheduled  DIET:   Cardiac diet  ACTIVITY:   Activity as tolerated  OXYGEN:   Home Oxygen: No.  Oxygen Delivery: room air  DISCHARGE LOCATION:   home   If you experience worsening of your  admission symptoms, develop shortness of breath, life threatening emergency, suicidal or homicidal thoughts you must seek medical attention immediately by calling 911 or calling your MD immediately  if symptoms less severe.  You Must read complete instructions/literature along with all the possible adverse reactions/side effects for all the Medicines you take and that have been prescribed to you. Take any new Medicines after you have completely understood and accpet all  the possible adverse reactions/side effects.   Please note  You were cared for by a hospitalist during your hospital stay. If you have any questions about your discharge medications or the care you received while you were in the hospital after you are discharged, you can call the unit and asked to speak with the hospitalist on call if the hospitalist that took care of you is not available. Once you are discharged, your primary care physician will handle any further medical issues. Please note that NO REFILLS for any discharge medications will be authorized once you are discharged, as it is imperative that you return to your primary care physician (or establish a relationship with a primary care physician if you do not have one) for your aftercare needs so that they can reassess your need for medications and monitor your lab values.    On the day of Discharge:  VITAL SIGNS:   Blood pressure (!) 144/81, pulse 62, temperature 97.8 F (36.6 C), temperature source Oral, resp. rate 20, height 5\' 9"  (1.753 m), weight 95.3 kg (210 lb), SpO2 100 %.  PHYSICAL EXAMINATION:    GENERAL:  82 y.o.-year-old patient lying in the bed with no acute distress.  EYES: Pupils equal, round, reactive to light and accommodation. No scleral icterus. Extraocular muscles intact. Cataracts noted HEENT: Head atraumatic, normocephalic. Oropharynx and nasopharynx clear.  NECK:  Supple, no jugular venous distention. No thyroid enlargement, no tenderness.  LUNGS: Normal breath sounds bilaterally, no wheezing, rales,rhonchi or crepitation. No use of accessory muscles of respiration.  CARDIOVASCULAR: S1, S2 normal. No  rubs, or gallops. 2/6 systolic murmur is present ABDOMEN: Soft, non-tender, non-distended. Bowel sounds present. No organomegaly or mass.  EXTREMITIES: No pedal edema, cyanosis, or clubbing.  NEUROLOGIC: Cranial nerves II through XII are intact. Muscle strength 5/5 in all extremities. Sensation intact. Gait not  checked.  PSYCHIATRIC: The patient is alert and oriented x 3. No Visual hallucinations now. SKIN: No obvious rash, lesion, or ulcer.   DATA REVIEW:   CBC  Recent Labs Lab 09/20/16 0506  WBC 6.7  HGB 12.6*  HCT 36.9*  PLT 221    Chemistries   Recent Labs Lab 09/19/16 0125 09/20/16 0506  NA 139 139  K 3.3* 3.6  CL 102 106  CO2 30 30  GLUCOSE 102* 94  BUN 22* 15  CREATININE 1.55* 1.17  CALCIUM 8.8* 8.5*  AST 27  --   ALT 14*  --   ALKPHOS 70  --   BILITOT 0.6  --      Microbiology Results  Results for orders placed or performed during the hospital encounter of 07/10/16  Surgical pcr screen     Status: None   Collection Time: 07/10/16 10:12 AM  Result Value Ref Range Status   MRSA, PCR NEGATIVE NEGATIVE Final   Staphylococcus aureus NEGATIVE NEGATIVE Final    Comment:        The Xpert SA Assay (FDA approved for NASAL specimens in patients over 38 years of age), is one component of a comprehensive surveillance program.  Test performance  has been validated by [facility] for patients greater than or equal to 64 year old. It is not intended to diagnose infection nor to guide or monitor treatment.     RADIOLOGY:  No results found.   Management plans discussed with the patient, family and they are in agreement.  CODE STATUS:  Code Status History    Date Active Date Inactive Code Status Order ID Comments User Context   09/19/2016  8:39 AM 09/19/2016 10:50 AM Full Code [id number]  Gladstone Lighter, MD Inpatient   07/17/2016  3:43 PM 07/18/2016  6:48 PM Full Code [id number]  Katha Cabal, MD Inpatient   07/17/2016 11:46 AM 07/17/2016  3:43 PM Full Code [id number]  Katha Cabal, MD Inpatient   10/29/2015 10:57 AM 11/01/2015  8:23 PM Full Code [id number]  Hessie Knows, MD Inpatient      TOTAL TIME TAKING CARE OF THIS PATIENT: 37 minutes.    Gladstone Lighter M.D on 09/22/2016 at 2:34 PM  Between 7am to 6pm - Pager - [phone]  After 6pm go to  [email or web address] - Proofreader  [facility]  Office  [phone]  CC: Primary care physician; Maryland Pink, MD   Note: This dictation was prepared with Dragon dictation along with smaller phrase technology. Any transcriptional errors that result from this process are unintentional.

## 2016-09-22 NOTE — Progress Notes (Signed)
[facility]  Patient Status: Closed, patient's son refused services (SN & PT), stated his father was in excellent physical shape and he felt his father had mental issues not health issues. He said that his father was just talking all out of his head and they were seeking to get him psychiatric help.    Kyle Whitaker 09/22/2016, 8:39 AM

## 2016-09-25 NOTE — ED Provider Notes (Signed)
[facility] Emergency Department Provider Note    First MD Initiated Contact with Patient 09/19/16 0147     (approximate)  I have reviewed the triage vital signs and the nursing notes.   HISTORY  Chief Complaint Altered Mental Status   HPI Kyle Whitaker is a 82 y.o. male with history of peripheral vascular disease hypertension congestive heart failure and carotid endarterectomy presents to the emergency department with visual hallucinations times one week. Most of the history is obtained from the patient's son. Patient's son states that his father has worsening hallucinations during the evening hours. Patient denies any headache no nausea vomiting no gait instability or visual changes.   Past Medical History:  Diagnosis Date  . Anxiety   . Arthritis   . Cancer [facility]) 2005   Prostate  . CHF (congestive heart failure) ([place])   . Hyperlipidemia   . Hypertension   . Peripheral vascular disease ([place])    carotid stenosis  . Shortness of breath dyspnea    on exertion    Patient Active Problem List   Diagnosis Date Noted  . Hallucinations 09/19/2016  . Mild dementia 09/19/2016  . Spells of formed visual hallucinations 09/19/2016  . Carotid stenosis 07/17/2016  . Shortness of breath 06/28/2016  . Carotid artery stenosis   . Chronic coronary artery disease   . Primary osteoarthritis of left hip 10/29/2015    Past Surgical History:  Procedure Laterality Date  . ENDARTERECTOMY Right 07/17/2016   Procedure: ENDARTERECTOMY CAROTID;  Surgeon: Katha Cabal, MD;  Location: [facility] ORS;  Service: Vascular;  Laterality: Right;  . JOINT REPLACEMENT Right 2011   Total Hip Replacement, [facility]  . JOINT REPLACEMENT Right 2011   Total Knee Replacement, [facility]  . KNEE ARTHROSCOPY Right 2011  . PROSTATE SURGERY    . TOTAL HIP ARTHROPLASTY Left 10/29/2015   Procedure: TOTAL HIP ARTHROPLASTY ANTERIOR APPROACH;  Surgeon: Hessie Knows, MD;  Location: [facility] ORS;  Service:  Orthopedics;  Laterality: Left;    Prior to Admission medications   Medication Sig Start Date End Date Taking? Authorizing Provider  amLODipine (NORVASC) 5 MG tablet Take 1 tablet (5 mg total) by mouth daily. 09/21/16   Gladstone Lighter, MD  aspirin EC 81 MG EC tablet Take 1 tablet (81 mg total) by mouth daily. 07/18/16   Algernon Huxley, MD  atenolol (TENORMIN) 25 MG tablet Take 25 mg by mouth daily.    Historical Provider, MD  clopidogrel (PLAVIX) 75 MG tablet Take 1 tablet (75 mg total) by mouth daily. 07/18/16   Algernon Huxley, MD  diazepam (VALIUM) 2 MG tablet Take 1 tablet (2 mg total) by mouth 2 (two) times daily. 09/20/16   Gladstone Lighter, MD  lisinopril-hydrochlorothiazide (PRINZIDE,ZESTORETIC) 20-25 MG tablet Take 1 tablet by mouth daily. 05/05/16   Historical Provider, MD  risperiDONE (RISPERDAL M-TABS) 0.5 MG disintegrating tablet Take 1 tablet (0.5 mg total) by mouth at bedtime. 09/20/16   Gladstone Lighter, MD  traMADol (ULTRAM) 50 MG tablet Take 1 tablet (50 mg total) by mouth every 6 (six) hours as needed for moderate pain. 07/18/16   Algernon Huxley, MD    Allergies Vicodin [hydrocodone-acetaminophen] and Penicillins  Family History  Problem Relation Age of Onset  . CAD Mother     Social History Social History  Substance Use Topics  . Smoking status: Former Smoker    Packs/day: 1.00    Types: Cigarettes  . Smokeless tobacco: Never Used  . Alcohol use  No    Review of Systems Constitutional: No fever/chills Eyes: No visual changes. ENT: No sore throat. Cardiovascular: Denies chest pain. Respiratory: Denies shortness of breath. Gastrointestinal: No abdominal pain.  No nausea, no vomiting.  No diarrhea.  No constipation. Genitourinary: Negative for dysuria. Musculoskeletal: Negative for back pain. Skin: Negative for rash. Neurological: Negative for headaches, focal weakness or numbness. Psychiatric:Positive for visual hallucinations  10-point ROS otherwise  negative.  ____________________________________________   PHYSICAL EXAM:  VITAL SIGNS: ED Triage Vitals  Enc Vitals Group     BP 09/19/16 0121 138/74     Pulse Rate 09/19/16 0121 70     Resp 09/19/16 0121 20     Temp 09/19/16 0121 98.1 F (36.7 C)     Temp Source 09/19/16 0121 Oral     SpO2 09/19/16 0121 100 %     Weight 09/19/16 0122 210 lb (95.3 kg)     Height 09/19/16 0122 5\' 9"  (1.753 m)     Head Circumference --      Peak Flow --      Pain Score 09/19/16 0123 0     Pain Loc --      Pain Edu? --      Excl. in [place]? --     Constitutional: Alert and oriented. Well appearing and in no acute distress. Eyes: Conjunctivae are normal. PERRL. EOMI. Head: Atraumatic. Ears:  Healthy appearing ear canals and TMs bilaterally Nose: No congestion/rhinnorhea. Mouth/Throat: Mucous membranes are moist.  Oropharynx non-erythematous. Neck: No stridor.  No meningeal signs.  No cervical spine tenderness to palpation. Cardiovascular: Normal rate, regular rhythm. Good peripheral circulation. Grossly normal heart sounds. Respiratory: Normal respiratory effort.  No retractions. Lungs CTAB. Gastrointestinal: Soft and nontender. No distention.  Musculoskeletal: No lower extremity tenderness nor edema. No gross deformities of extremities. Neurologic:  Normal speech and language. No gross focal neurologic deficits are appreciated.  Skin:  Skin is warm, dry and intact. No rash noted. Psychiatric: Mood and affect are normal. Speech and behavior are normal.Positive for visual hallucinations  ____________________________________________   LABS (all labs ordered are listed, but only abnormal results are displayed)  Labs Reviewed  COMPREHENSIVE METABOLIC PANEL - Abnormal; Notable for the following:       Result Value   Potassium 3.3 (*)    Glucose, Bld 102 (*)    BUN 22 (*)    Creatinine, Ser 1.55 (*)    Calcium 8.8 (*)    ALT 14 (*)    GFR calc non Af Amer 39 (*)    GFR calc Af Amer 46 (*)     All other components within normal limits  CBC - Abnormal; Notable for the following:    RBC 4.06 (*)    Hemoglobin 12.8 (*)    HCT 36.8 (*)    All other components within normal limits  URINALYSIS COMPLETEWITH MICROSCOPIC ([facility] ONLY) - Abnormal; Notable for the following:    Color, Urine YELLOW (*)    APPearance CLEAR (*)    Hgb urine dipstick 1+ (*)    Squamous Epithelial / LPF 0-5 (*)    All other components within normal limits  BASIC METABOLIC PANEL - Abnormal; Notable for the following:    Calcium 8.5 (*)    GFR calc non Af Amer 55 (*)    Anion gap 3 (*)    All other components within normal limits  CBC - Abnormal; Notable for the following:    RBC 4.08 (*)    Hemoglobin 12.6 (*)  HCT 36.9 (*)    All other components within normal limits  TSH  VITAMIN B12   ____________________________________________  EKG  ED ECG REPORT I, [place] N BROWN, the attending physician, personally viewed and interpreted this ECG.   Date: 09/25/2016  EKG Time: 1:20 AM  Rate: 74  Rhythm: Sinus rhythm with frequent PVCs  Axis: Normal  Intervals: Normal  ST&T Change: None  ____________________________________________  RADIOLOGY I,  N BROWN, personally viewed and evaluated these images (plain radiographs) as part of my medical decision making, as well as reviewing the written report by the radiologist.  No results found.  ____________________________________________   Procedures    INITIAL IMPRESSION / ASSESSMENT AND PLAN / ED COURSE  Pertinent labs & imaging results that were available during my care of the patient were reviewed by me and considered in my medical decision making (see chart for details).  82 year old male presenting with visual hallucinations. Consider possibility of vascular dementia versus other etiologies given presenting history. CT scan of the head performed which revealed all small right frontal lobe infarct that the family and patient were  unaware of as such MRI of the brain ordered.   Clinical Course    ____________________________________________  FINAL CLINICAL IMPRESSION(S) / ED DIAGNOSES  Final diagnoses:  TIA (transient ischemic attack)  Confusion     MEDICATIONS GIVEN DURING THIS VISIT:  Medications  potassium chloride SA (K-DUR,KLOR-CON) CR tablet 40 mEq (40 mEq Oral Given 09/19/16 1408)     NEW OUTPATIENT MEDICATIONS STARTED DURING THIS VISIT:  Discharge Medication List as of 09/20/2016  3:26 PM    START taking these medications   Details  amLODipine (NORVASC) 5 MG tablet Take 1 tablet (5 mg total) by mouth daily., Starting Mon 09/21/2016, Print    risperiDONE (RISPERDAL M-TABS) 0.5 MG disintegrating tablet Take 1 tablet (0.5 mg total) by mouth at bedtime., Starting Sun 09/20/2016, Print        Discharge Medication List as of 09/20/2016  3:26 PM    CONTINUE these medications which have CHANGED   Details  diazepam (VALIUM) 2 MG tablet Take 1 tablet (2 mg total) by mouth 2 (two) times daily., Starting Sun 09/20/2016, Print        Discharge Medication List as of 09/20/2016  3:26 PM       Note:  This document was prepared using Dragon voice recognition software and may include unintentional dictation errors.    Gregor Hams, MD 09/25/16 [phone]

## 2016-12-03 ENCOUNTER — Encounter: Payer: Self-pay | Admitting: *Deleted

## 2016-12-10 ENCOUNTER — Ambulatory Visit
Admission: RE | Admit: 2016-12-10 | Discharge: 2016-12-10 | Disposition: A | Discharge status: Home / Self Care | Payer: Medicare Other | Source: Ambulatory Visit | Attending: Ophthalmology | Admitting: Ophthalmology

## 2016-12-10 ENCOUNTER — Encounter
Admission: RE | Disposition: A | Discharge status: Home / Self Care | Payer: Self-pay | Source: Ambulatory Visit | Attending: Ophthalmology

## 2016-12-10 ENCOUNTER — Encounter: Payer: Self-pay | Admitting: *Deleted

## 2016-12-10 ENCOUNTER — Ambulatory Visit: Payer: Medicare Other | Admitting: Anesthesiology

## 2016-12-10 DIAGNOSIS — H2511 Age-related nuclear cataract, right eye: Secondary | ICD-10-CM | POA: Insufficient documentation

## 2016-12-10 DIAGNOSIS — Z79899 Other long term (current) drug therapy: Secondary | ICD-10-CM | POA: Insufficient documentation

## 2016-12-10 DIAGNOSIS — I509 Heart failure, unspecified: Secondary | ICD-10-CM | POA: Insufficient documentation

## 2016-12-10 DIAGNOSIS — Z87891 Personal history of nicotine dependence: Secondary | ICD-10-CM | POA: Diagnosis not present

## 2016-12-10 DIAGNOSIS — F419 Anxiety disorder, unspecified: Secondary | ICD-10-CM | POA: Diagnosis not present

## 2016-12-10 DIAGNOSIS — I2510 Atherosclerotic heart disease of native coronary artery without angina pectoris: Secondary | ICD-10-CM | POA: Diagnosis not present

## 2016-12-10 DIAGNOSIS — I739 Peripheral vascular disease, unspecified: Secondary | ICD-10-CM | POA: Diagnosis not present

## 2016-12-10 DIAGNOSIS — M1990 Unspecified osteoarthritis, unspecified site: Secondary | ICD-10-CM | POA: Insufficient documentation

## 2016-12-10 DIAGNOSIS — I110 Hypertensive heart disease with heart failure: Secondary | ICD-10-CM | POA: Insufficient documentation

## 2016-12-10 HISTORY — DX: Atherosclerotic heart disease of native coronary artery without angina pectoris: I25.10

## 2016-12-10 HISTORY — PX: CATARACT EXTRACTION W/PHACO: SHX586

## 2016-12-10 SURGERY — PHACOEMULSIFICATION, CATARACT, WITH IOL INSERTION
Anesthesia: Monitor Anesthesia Care | Site: Eye | Laterality: Right | Wound class: Clean

## 2016-12-10 MED ORDER — ONDANSETRON HCL 4 MG/2ML IJ SOLN
INTRAMUSCULAR | Status: DC | PRN
  Administered 2016-12-10: 4 mg via INTRAVENOUS

## 2016-12-10 MED ORDER — MOXIFLOXACIN HCL 0.5 % OP SOLN
1.0000 [drp] | OPHTHALMIC | Status: DC | PRN
Start: 2016-12-10 — End: 2016-12-10

## 2016-12-10 MED ORDER — ARMC OPHTHALMIC DILATING DROPS
OPHTHALMIC | Status: AC
  Administered 2016-12-10: 1 via OPHTHALMIC
  Filled 2016-12-10: qty 0.4

## 2016-12-10 MED ORDER — ARMC OPHTHALMIC DILATING DROPS
1.0000 "application " | OPHTHALMIC | Status: AC
  Administered 2016-12-10 (×2): 1 via OPHTHALMIC

## 2016-12-10 MED ORDER — FENTANYL CITRATE (PF) 100 MCG/2ML IJ SOLN
INTRAMUSCULAR | Status: DC | PRN
  Administered 2016-12-10: 50 ug via INTRAVENOUS

## 2016-12-10 MED ORDER — MOXIFLOXACIN HCL 0.5 % OP SOLN
OPHTHALMIC | Status: AC
  Filled 2016-12-10: qty 3

## 2016-12-10 MED ORDER — SODIUM HYALURONATE 10 MG/ML IO SOLN
INTRAOCULAR | Status: DC | PRN
  Administered 2016-12-10: 0.85 mL via INTRAOCULAR

## 2016-12-10 MED ORDER — SODIUM HYALURONATE 10 MG/ML IO SOLN
INTRAOCULAR | Status: AC
  Filled 2016-12-10: qty 0.85

## 2016-12-10 MED ORDER — BSS IO SOLN
INTRAOCULAR | Status: DC | PRN
  Administered 2016-12-10: 200 mL via INTRAOCULAR

## 2016-12-10 MED ORDER — LIDOCAINE HCL (PF) 4 % IJ SOLN
INTRAOCULAR | Status: DC | PRN
  Administered 2016-12-10: 4 mL via OPHTHALMIC

## 2016-12-10 MED ORDER — SODIUM HYALURONATE 23 MG/ML IO SOLN
INTRAOCULAR | Status: DC | PRN
  Administered 2016-12-10: 0.6 mL via INTRAOCULAR

## 2016-12-10 MED ORDER — SODIUM CHLORIDE 0.9 % IV SOLN
INTRAVENOUS | Status: DC
  Administered 2016-12-10: 07:00:00 via INTRAVENOUS

## 2016-12-10 MED ORDER — MIDAZOLAM HCL 2 MG/2ML IJ SOLN
INTRAMUSCULAR | Status: DC | PRN
  Administered 2016-12-10: 1 mg via INTRAVENOUS

## 2016-12-10 MED ORDER — EPINEPHRINE PF 1 MG/ML IJ SOLN
INTRAMUSCULAR | Status: AC
  Filled 2016-12-10: qty 1

## 2016-12-10 MED ORDER — LIDOCAINE HCL (PF) 4 % IJ SOLN
INTRAMUSCULAR | Status: AC
  Filled 2016-12-10: qty 5

## 2016-12-10 MED ORDER — MOXIFLOXACIN HCL 0.5 % OP SOLN
OPHTHALMIC | Status: DC | PRN
  Administered 2016-12-10: 9 [drp] via OPHTHALMIC

## 2016-12-10 MED ORDER — SODIUM HYALURONATE 23 MG/ML IO SOLN
INTRAOCULAR | Status: AC
  Filled 2016-12-10: qty 0.6

## 2016-12-10 MED ORDER — POVIDONE-IODINE 5 % OP SOLN
OPHTHALMIC | Status: AC
  Filled 2016-12-10: qty 30

## 2016-12-10 SURGICAL SUPPLY — 25 items
CANNULA ANT/CHMB 27GA (MISCELLANEOUS) ×6 IMPLANT
CUP MEDICINE 2OZ PLAST GRAD ST (MISCELLANEOUS) ×3 IMPLANT
DISSECTOR HYDRO NUCLEUS 50X22 (MISCELLANEOUS) ×3 IMPLANT
GLOVE BIO SURGEON STRL SZ8 (GLOVE) ×3 IMPLANT
GLOVE BIOGEL M 6.5 STRL (GLOVE) ×3 IMPLANT
GLOVE SURG LX 7.5 STRW (GLOVE) ×2
GLOVE SURG LX STRL 7.5 STRW (GLOVE) ×1 IMPLANT
GOWN STRL REUS W/ TWL LRG LVL3 (GOWN DISPOSABLE) ×2 IMPLANT
GOWN STRL REUS W/TWL LRG LVL3 (GOWN DISPOSABLE) ×4
LENS IOL TECNIS ITEC 25.0 (Intraocular Lens) ×3 IMPLANT
NEEDLE CAPSULORHEX 25GA (NEEDLE) ×2 IMPLANT
PACK CATARACT ×3 IMPLANT
PACK CATARACT (MISCELLANEOUS) IMPLANT
PACK CATARACT BRASINGTON LX (MISCELLANEOUS) IMPLANT
PACK CATARACT KING (MISCELLANEOUS) ×2 IMPLANT
PACK EYE AFTER SURG (MISCELLANEOUS) ×3 IMPLANT
SOL BSS BAG (MISCELLANEOUS) ×3
SOL PREP PVP 2OZ (MISCELLANEOUS) ×3
SOLUTION BSS BAG (MISCELLANEOUS) ×1 IMPLANT
SOLUTION PREP PVP 2OZ (MISCELLANEOUS) ×1 IMPLANT
SYR 3ML LL SCALE MARK (SYRINGE) ×6 IMPLANT
SYR 5ML LL (SYRINGE) ×3 IMPLANT
SYR TB 1ML 27GX1/2 LL (SYRINGE) ×3 IMPLANT
WATER STERILE IRR 250ML POUR (IV SOLUTION) ×3 IMPLANT
WIPE NON LINTING 3.25X3.25 (MISCELLANEOUS) ×3 IMPLANT

## 2016-12-10 NOTE — Anesthesia Postprocedure Evaluation (Signed)
Anesthesia Post Note  Patient: Kyle Whitaker  Procedure(s) Performed: Procedure(s) (LRB): CATARACT EXTRACTION PHACO AND INTRAOCULAR LENS PLACEMENT (IOC) (Right)  Patient location during evaluation: PACU Anesthesia Type: MAC Level of consciousness: awake and alert and oriented Pain management: satisfactory to patient Vital Signs Assessment: post-procedure vital signs reviewed and stable Respiratory status: respiratory function stable Cardiovascular status: stable Anesthetic complications: no    Last Vitals:  Vitals:   12/10/16 0634  BP: (!) 146/75  Pulse: 70  Resp: 16  Temp: 36.5 C    Last Pain:  Vitals:   12/10/16 0634  TempSrc: Oral  PainSc: 0-No pain                 Blima Singer

## 2016-12-10 NOTE — Op Note (Signed)
OPERATIVE NOTE  Kyle Whitaker [id number] 12/10/2016   PREOPERATIVE DIAGNOSIS:  Nuclear sclerotic cataract right eye.  H25.11   POSTOPERATIVE DIAGNOSIS:    Nuclear sclerotic cataract right eye.     PROCEDURE:  Phacoemusification with posterior chamber intraocular lens placement of the right eye   LENS:   Implant Name Type Inv. Item Serial No. Manufacturer Lot No. LRB No. Used  LENS IOL DIOP 25.0 - [id number] Intraocular Lens LENS IOL DIOP 25.0 [id number] AMO   Right 1       +25.0   ULTRASOUND TIME: 1 minutes 31 seconds.  CDE 17.30   SURGEON:  Benay Pillow, MD, MPH  ANESTHESIOLOGIST: Anesthesiologist: Gunnar Bulla, MD CRNA: Demetrius Charity, CRNA; Leander Rams, CRNA   ANESTHESIA:  Topical with tetracaine drops augmented with 1% preservative-free intracameral lidocaine.  ESTIMATED BLOOD LOSS: less than 1 mL.   COMPLICATIONS:  None.   DESCRIPTION OF PROCEDURE:  The patient was identified in the holding room and transported to the operating room and placed in the supine position under the operating microscope.  The right eye was identified as the operative eye and it was prepped and draped in the usual sterile ophthalmic fashion.   A 1.0 millimeter clear-corneal paracentesis was made at the 10:30 position. 0.5 ml of preservative-free 1% lidocaine with epinephrine was injected into the anterior chamber.  The anterior chamber was filled with Healon 5 viscoelastic.  A 2.4 millimeter keratome was used to make a near-clear corneal incision at the 8:00 position.  The cornea was relatively flat, so the incision was slightly shorter than usual.  A curvilinear capsulorrhexis was made with a cystotome and capsulorrhexis forceps.  Balanced salt solution was used to hydrodissect and hydrodelineate the nucleus.   Phacoemulsification was then used in stop and chop fashion to remove the lens nucleus and epinucleus.  The remaining cortex was then removed using the irrigation and aspiration  handpiece. Healon was then placed into the capsular bag to distend it for lens placement.  A lens was then injected into the capsular bag.  The remaining viscoelastic was aspirated.   Wounds were hydrated with balanced salt solution.  The anterior chamber was inflated to a physiologic pressure with balanced salt solution.   Intracameral vigamox 0.1 mL undiluted was injected into the eye and a drop placed onto the ocular surface.  No wound leaks were noted.  The patient was taken to the recovery room in stable condition without complications of anesthesia or surgery  Benay Pillow 12/10/2016, 8:52 AM

## 2016-12-10 NOTE — Discharge Instructions (Signed)
Eye Surgery Discharge Instructions  Expect mild scratchy sensation or mild soreness. DO NOT RUB YOUR EYE!  The day of surgery:  Minimal physical activity, but bed rest is not required  No reading, computer work, or close hand work  No bending, lifting, or straining.  May watch TV  For 24 hours:  No driving, legal decisions, or alcoholic beverages  Safety precautions  Eat anything you prefer: It is better to start with liquids, then soup then solid foods.  _____ Eye patch should be worn until postoperative exam tomorrow.  ____ Solar shield eyeglasses should be worn for comfort in the sunlight/patch while sleeping  Resume all regular medications including aspirin or Coumadin if these were discontinued prior to surgery. You may shower, bathe, shave, or wash your hair. Tylenol may be taken for mild discomfort.  Call your doctor if you experience significant pain, nausea, or vomiting, fever > 101 or other signs of infection. [phone] or [phone] Specific instructions:  Follow-up Information    Benay Pillow, MD Follow up.   Specialty:  Ophthalmology Why:  December 15 at 10:00am Contact information: [address] [place]  [address] [phone]

## 2016-12-10 NOTE — Anesthesia Preprocedure Evaluation (Signed)
Anesthesia Evaluation  Patient identified by MRN, date of birth, ID band Patient awake    Reviewed: Allergy & Precautions, NPO status , Patient's Chart, lab work & pertinent test results, reviewed documented beta blocker date and time   Airway Mallampati: II  TM Distance: >3 FB     Dental  (+) Chipped   Pulmonary shortness of breath, former smoker,           Cardiovascular hypertension, Pt. on medications and Pt. on home beta blockers + CAD, + Peripheral Vascular Disease and +CHF       Neuro/Psych PSYCHIATRIC DISORDERS Anxiety    GI/Hepatic   Endo/Other    Renal/GU      Musculoskeletal  (+) Arthritis ,   Abdominal   Peds  Hematology   Anesthesia Other Findings   Reproductive/Obstetrics                             Anesthesia Physical Anesthesia Plan  ASA: III  Anesthesia Plan: MAC   Post-op Pain Management:    Induction:   Airway Management Planned:   Additional Equipment:   Intra-op Plan:   Post-operative Plan:   Informed Consent: I have reviewed the patients History and Physical, chart, labs and discussed the procedure including the risks, benefits and alternatives for the proposed anesthesia with the patient or authorized representative who has indicated his/her understanding and acceptance.     Plan Discussed with: CRNA  Anesthesia Plan Comments:         Anesthesia Quick Evaluation

## 2016-12-10 NOTE — H&P (Signed)
The History and Physical notes are on paper, have been signed, and are to be scanned. The patient remains stable and unchanged from the H&P.   Previous H&P reviewed, patient examined, and there are no changes.  Kyle Whitaker 12/10/2016 8:11 AM

## 2016-12-10 NOTE — Transfer of Care (Signed)
Immediate Anesthesia Transfer of Care Note  Patient: Kyle Whitaker  Procedure(s) Performed: Procedure(s) with comments: CATARACT EXTRACTION PHACO AND INTRAOCULAR LENS PLACEMENT (IOC) (Right) - Lot [id number] H US:01:31.2 AP%:18.4 CDE:17.30  Patient Location: PACU  Anesthesia Type:MAC  Level of Consciousness: awake, alert  and oriented  Airway & Oxygen Therapy: Patient Spontanous Breathing and Patient connected to nasal cannula oxygen  Post-op Assessment: Report given to RN and Post -op Vital signs reviewed and stable  Post vital signs: Reviewed and stable  Last Vitals:  Vitals:   12/10/16 0634  BP: (!) 146/75  Pulse: 70  Resp: 16  Temp: 36.5 C    Last Pain:  Vitals:   12/10/16 0634  TempSrc: Oral  PainSc: 0-No pain         Complications: No apparent anesthesia complications

## 2016-12-11 ENCOUNTER — Encounter: Payer: Self-pay | Admitting: Ophthalmology

## 2016-12-29 ENCOUNTER — Encounter: Payer: Self-pay | Admitting: *Deleted

## 2017-01-07 ENCOUNTER — Ambulatory Visit: Payer: Medicare Other | Admitting: Anesthesiology

## 2017-01-07 ENCOUNTER — Encounter
Admission: RE | Disposition: A | Discharge status: Home / Self Care | Payer: Self-pay | Source: Ambulatory Visit | Attending: Ophthalmology

## 2017-01-07 ENCOUNTER — Ambulatory Visit
Admission: RE | Admit: 2017-01-07 | Discharge: 2017-01-07 | Disposition: A | Discharge status: Home / Self Care | Payer: Medicare Other | Source: Ambulatory Visit | Attending: Ophthalmology | Admitting: Ophthalmology

## 2017-01-07 ENCOUNTER — Encounter: Payer: Self-pay | Admitting: *Deleted

## 2017-01-07 DIAGNOSIS — I509 Heart failure, unspecified: Secondary | ICD-10-CM | POA: Diagnosis not present

## 2017-01-07 DIAGNOSIS — H2512 Age-related nuclear cataract, left eye: Secondary | ICD-10-CM | POA: Insufficient documentation

## 2017-01-07 DIAGNOSIS — I110 Hypertensive heart disease with heart failure: Secondary | ICD-10-CM | POA: Insufficient documentation

## 2017-01-07 DIAGNOSIS — Z87891 Personal history of nicotine dependence: Secondary | ICD-10-CM | POA: Insufficient documentation

## 2017-01-07 DIAGNOSIS — I2510 Atherosclerotic heart disease of native coronary artery without angina pectoris: Secondary | ICD-10-CM | POA: Diagnosis not present

## 2017-01-07 DIAGNOSIS — Z79899 Other long term (current) drug therapy: Secondary | ICD-10-CM | POA: Diagnosis not present

## 2017-01-07 DIAGNOSIS — F419 Anxiety disorder, unspecified: Secondary | ICD-10-CM | POA: Diagnosis not present

## 2017-01-07 HISTORY — PX: CATARACT EXTRACTION W/PHACO: SHX586

## 2017-01-07 SURGERY — PHACOEMULSIFICATION, CATARACT, WITH IOL INSERTION
Anesthesia: Monitor Anesthesia Care | Site: Eye | Laterality: Left | Wound class: Clean

## 2017-01-07 MED ORDER — MIDAZOLAM HCL 2 MG/2ML IJ SOLN
INTRAMUSCULAR | Status: DC | PRN
Start: 2017-01-07 — End: 2017-01-07
  Administered 2017-01-07: 1 mg via INTRAVENOUS

## 2017-01-07 MED ORDER — LIDOCAINE HCL (PF) 4 % IJ SOLN
INTRAOCULAR | Status: DC | PRN
  Administered 2017-01-07: 4 mL via OPHTHALMIC

## 2017-01-07 MED ORDER — SODIUM HYALURONATE 10 MG/ML IO SOLN
INTRAOCULAR | Status: DC | PRN
  Administered 2017-01-07: 0.85 mL via INTRAOCULAR

## 2017-01-07 MED ORDER — BSS IO SOLN
INTRAOCULAR | Status: DC | PRN
  Administered 2017-01-07: 250 mL via INTRAOCULAR

## 2017-01-07 MED ORDER — FENTANYL CITRATE (PF) 100 MCG/2ML IJ SOLN
INTRAMUSCULAR | Status: AC
  Filled 2017-01-07: qty 2

## 2017-01-07 MED ORDER — MOXIFLOXACIN HCL 0.5 % OP SOLN: 1.0000 [drp] | OPHTHALMIC | Status: DC | PRN

## 2017-01-07 MED ORDER — ARMC OPHTHALMIC DILATING DROPS
OPHTHALMIC | Status: AC
  Filled 2017-01-07: qty 0.4

## 2017-01-07 MED ORDER — ONDANSETRON HCL 4 MG/2ML IJ SOLN
INTRAMUSCULAR | Status: DC | PRN
  Administered 2017-01-07: 4 mg via INTRAVENOUS

## 2017-01-07 MED ORDER — ARMC OPHTHALMIC DILATING DROPS
1.0000 "application " | OPHTHALMIC | Status: AC
  Administered 2017-01-07 (×3): 1 via OPHTHALMIC

## 2017-01-07 MED ORDER — SODIUM HYALURONATE 23 MG/ML IO SOLN
INTRAOCULAR | Status: AC
  Filled 2017-01-07: qty 0.6

## 2017-01-07 MED ORDER — SODIUM HYALURONATE 10 MG/ML IO SOLN
INTRAOCULAR | Status: AC
  Filled 2017-01-07: qty 0.85

## 2017-01-07 MED ORDER — FENTANYL CITRATE (PF) 100 MCG/2ML IJ SOLN
INTRAMUSCULAR | Status: DC | PRN
  Administered 2017-01-07: 50 ug via INTRAVENOUS

## 2017-01-07 MED ORDER — MOXIFLOXACIN HCL 0.5 % OP SOLN
OPHTHALMIC | Status: DC | PRN
  Administered 2017-01-07: 9 [drp] via OPHTHALMIC

## 2017-01-07 MED ORDER — ONDANSETRON HCL 4 MG/2ML IJ SOLN
INTRAMUSCULAR | Status: AC
  Filled 2017-01-07: qty 2

## 2017-01-07 MED ORDER — MOXIFLOXACIN HCL 0.5 % OP SOLN
OPHTHALMIC | Status: AC
  Filled 2017-01-07: qty 3

## 2017-01-07 MED ORDER — LIDOCAINE HCL (PF) 4 % IJ SOLN
INTRAMUSCULAR | Status: AC
  Filled 2017-01-07: qty 5

## 2017-01-07 MED ORDER — EPINEPHRINE PF 1 MG/ML IJ SOLN
INTRAMUSCULAR | Status: AC
  Filled 2017-01-07: qty 1

## 2017-01-07 MED ORDER — MIDAZOLAM HCL 2 MG/2ML IJ SOLN
INTRAMUSCULAR | Status: AC
  Filled 2017-01-07: qty 2

## 2017-01-07 MED ORDER — SODIUM HYALURONATE 23 MG/ML IO SOLN
INTRAOCULAR | Status: DC | PRN
  Administered 2017-01-07: 0.6 mL via INTRAOCULAR

## 2017-01-07 MED ORDER — SODIUM CHLORIDE 0.9 % IV SOLN
INTRAVENOUS | Status: DC
  Administered 2017-01-07: 10:00:00 via INTRAVENOUS

## 2017-01-07 MED ORDER — POVIDONE-IODINE 5 % OP SOLN
OPHTHALMIC | Status: AC
  Filled 2017-01-07: qty 30

## 2017-01-07 SURGICAL SUPPLY — 20 items
CANNULA ANT/CHMB 27GA (MISCELLANEOUS) ×6 IMPLANT
CUP MEDICINE 2OZ PLAST GRAD ST (MISCELLANEOUS) ×3 IMPLANT
DISSECTOR HYDRO NUCLEUS 50X22 (MISCELLANEOUS) ×3 IMPLANT
GLOVE BIO SURGEON STRL SZ8 (GLOVE) ×3 IMPLANT
GLOVE BIOGEL M 6.5 STRL (GLOVE) ×3 IMPLANT
GLOVE SURG LX 7.5 STRW (GLOVE) ×2
GLOVE SURG LX STRL 7.5 STRW (GLOVE) ×1 IMPLANT
GOWN STRL REUS W/ TWL LRG LVL3 (GOWN DISPOSABLE) ×2 IMPLANT
GOWN STRL REUS W/TWL LRG LVL3 (GOWN DISPOSABLE) ×4
LENS IOL TECNIS ITEC 25.0 (Intraocular Lens) ×3 IMPLANT
PACK CATARACT (MISCELLANEOUS) ×3 IMPLANT
PACK CATARACT KING (MISCELLANEOUS) ×3 IMPLANT
PACK EYE AFTER SURG (MISCELLANEOUS) ×3 IMPLANT
SOL BSS BAG (MISCELLANEOUS) ×3
SOLUTION BSS BAG (MISCELLANEOUS) ×1 IMPLANT
SYR 3ML LL SCALE MARK (SYRINGE) ×6 IMPLANT
SYR 5ML LL (SYRINGE) ×3 IMPLANT
SYR TB 1ML 27GX1/2 LL (SYRINGE) ×3 IMPLANT
WATER STERILE IRR 250ML POUR (IV SOLUTION) ×3 IMPLANT
WIPE NON LINTING 3.25X3.25 (MISCELLANEOUS) ×3 IMPLANT

## 2017-01-07 NOTE — Anesthesia Preprocedure Evaluation (Signed)
Anesthesia Evaluation  Patient identified by MRN, date of birth, ID band Patient awake    Reviewed: Allergy & Precautions, NPO status , Patient's Chart, lab work & pertinent test results  History of Anesthesia Complications Negative for: history of anesthetic complications  Airway Mallampati: II       Dental   Pulmonary neg pulmonary ROS, former smoker,           Cardiovascular hypertension, Pt. on medications and Pt. on home beta blockers + CAD and +CHF       Neuro/Psych Anxiety negative neurological ROS     GI/Hepatic negative GI ROS, Neg liver ROS,   Endo/Other  negative endocrine ROS  Renal/GU negative Renal ROS     Musculoskeletal   Abdominal   Peds  Hematology   Anesthesia Other Findings   Reproductive/Obstetrics                             Anesthesia Physical Anesthesia Plan  ASA: II  Anesthesia Plan: MAC   Post-op Pain Management:    Induction:   Airway Management Planned:   Additional Equipment:   Intra-op Plan:   Post-operative Plan:   Informed Consent: I have reviewed the patients History and Physical, chart, labs and discussed the procedure including the risks, benefits and alternatives for the proposed anesthesia with the patient or authorized representative who has indicated his/her understanding and acceptance.     Plan Discussed with:   Anesthesia Plan Comments:         Anesthesia Quick Evaluation

## 2017-01-07 NOTE — Transfer of Care (Signed)
Immediate Anesthesia Transfer of Care Note  Patient: Kyle Whitaker  Procedure(s) Performed: Procedure(s) with comments: CATARACT EXTRACTION PHACO AND INTRAOCULAR LENS PLACEMENT (IOC) (Left) - Lot# [id number] H [place]: 01:05.8 AP%: 16.3 CDE: 10.72  Patient Location: PHASE II  Anesthesia Type:MAC  Level of Consciousness: Awake, Alert, Oriented  Airway & Oxygen Therapy: Patient Spontanous Breathing and Patient on room air   Post-op Assessment: Report given to RN and Post -op Vital signs reviewed and stable  Post vital signs: Reviewed and stable  Last Vitals:  Vitals:   01/07/17 1003 01/07/17 1133  BP: (!) 143/95 (!) 145/77  Pulse: 61 60  Resp: 17 12  Temp: 36.4 C 94.9 C    Complications: No apparent anesthesia complications

## 2017-01-07 NOTE — H&P (Signed)
The History and Physical notes are on paper, have been signed, and are to be scanned. The patient remains stable and unchanged from the H&P.   Previous H&P reviewed, patient examined, and there are no changes.  Kyle Whitaker 01/07/2017 10:59 AM

## 2017-01-07 NOTE — Anesthesia Procedure Notes (Signed)
Procedure Name: MAC Date/Time: 01/07/2017 11:05 AM Performed by: Doreen Salvage Pre-anesthesia Checklist: Patient identified, Emergency Drugs available, Suction available and Patient being monitored Patient Re-evaluated:Patient Re-evaluated prior to inductionOxygen Delivery Method: Nasal cannula

## 2017-01-07 NOTE — Op Note (Signed)
OPERATIVE NOTE  BECK TESHIMA [id number] 01/07/2017   PREOPERATIVE DIAGNOSIS:  Nuclear sclerotic cataract left eye.  H25.12   POSTOPERATIVE DIAGNOSIS:    Nuclear sclerotic cataract left eye.     PROCEDURE:  Phacoemusification with posterior chamber intraocular lens placement of the left eye   LENS:   Implant Name Type Inv. Item Serial No. Manufacturer Lot No. LRB No. Used  LENS IOL DIOP 25.0 - [id number] Intraocular Lens LENS IOL DIOP 25.0 [id number] AMO   Left 1       PCB00 +25.0   ULTRASOUND TIME: 1 minutes 05 seconds.  CDE 10.72   SURGEON:  Benay Pillow, MD, MPH   ANESTHESIA:  Topical with tetracaine drops augmented with 1% preservative-free intracameral lidocaine.  ESTIMATED BLOOD LOSS: <1 mL   COMPLICATIONS:  None.   DESCRIPTION OF PROCEDURE:  The patient was identified in the holding room and transported to the operating room and placed in the supine position under the operating microscope.  The left eye was identified as the operative eye and it was prepped and draped in the usual sterile ophthalmic fashion.   A 1.0 millimeter clear-corneal paracentesis was made at the 5:00 position. 0.5 ml of preservative-free 1% lidocaine with epinephrine was injected into the anterior chamber.  The anterior chamber was filled with Healon 5 viscoelastic.  A 2.4 millimeter keratome was used to make a near-clear corneal incision at the 2:00 position.  A curvilinear capsulorrhexis was made with a cystotome and capsulorrhexis forceps.  Balanced salt solution was used to hydrodissect and hydrodelineate the nucleus.   Phacoemulsification was then used in stop and chop fashion to remove the lens nucleus and epinucleus.  The remaining cortex was then removed using the irrigation and aspiration handpiece. Healon was then placed into the capsular bag to distend it for lens placement.  A lens was then injected into the capsular bag.  The remaining viscoelastic was aspirated.   Wounds were  hydrated with balanced salt solution.  The anterior chamber was inflated to a physiologic pressure with balanced salt solution.   Intracameral vigamox 0.1 mL undiltued was injected into the eye and a drop placed onto the ocular surface.  No wound leaks were noted.  The patient was taken to the recovery room in stable condition without complications of anesthesia or surgery  Benay Pillow 01/07/2017, 11:31 AM

## 2017-01-07 NOTE — Discharge Instructions (Signed)
Eye Surgery Discharge Instructions  Expect mild scratchy sensation or mild soreness. DO NOT RUB YOUR EYE!  The day of surgery:  Minimal physical activity, but bed rest is not required  No reading, computer work, or close hand work  No bending, lifting, or straining.  May watch TV  For 24 hours:  No driving, legal decisions, or alcoholic beverages  Safety precautions  Eat anything you prefer: It is better to start with liquids, then soup then solid foods.  _____ Eye patch should be worn until postoperative exam tomorrow.  ____ Solar shield eyeglasses should be worn for comfort in the sunlight/patch while sleeping  Resume all regular medications including aspirin or Coumadin if these were discontinued prior to surgery. You may shower, bathe, shave, or wash your hair. Tylenol may be taken for mild discomfort.  Call your doctor if you experience significant pain, nausea, or vomiting, fever > 101 or other signs of infection. [phone] or [phone] Specific instructions:  Follow-up Information    Benay Pillow, MD Follow up on 01/08/2017.   Specialty:  Ophthalmology Why:  2:15 Contact information: [address] [place] [address] [phone]

## 2017-01-07 NOTE — Anesthesia Postprocedure Evaluation (Signed)
Anesthesia Post Note  Patient: Kyle Whitaker  Procedure(s) Performed: Procedure(s) (LRB): CATARACT EXTRACTION PHACO AND INTRAOCULAR LENS PLACEMENT (IOC) (Left)  Patient location during evaluation: PACU Anesthesia Type: MAC Level of consciousness: awake and alert Pain management: pain level controlled Vital Signs Assessment: post-procedure vital signs reviewed and stable Respiratory status: spontaneous breathing, nonlabored ventilation, respiratory function stable and patient connected to nasal cannula oxygen Cardiovascular status: stable and blood pressure returned to baseline Anesthetic complications: no     Last Vitals:  Vitals:   01/07/17 1003 01/07/17 1133  BP: (!) 143/95 (!) 145/77  Pulse: 61 60  Resp: 17 12  Temp: 36.4 C 36.7 C    Last Pain:  Vitals:   01/07/17 1133  TempSrc: Oral                 Alison Stalling

## 2017-02-03 ENCOUNTER — Encounter (INDEPENDENT_AMBULATORY_CARE_PROVIDER_SITE_OTHER): Payer: Self-pay

## 2017-02-03 ENCOUNTER — Ambulatory Visit (INDEPENDENT_AMBULATORY_CARE_PROVIDER_SITE_OTHER): Payer: Self-pay | Admitting: Vascular Surgery

## 2017-03-02 ENCOUNTER — Other Ambulatory Visit (INDEPENDENT_AMBULATORY_CARE_PROVIDER_SITE_OTHER): Payer: Self-pay | Admitting: Vascular Surgery

## 2017-03-02 DIAGNOSIS — Z9889 Other specified postprocedural states: Secondary | ICD-10-CM

## 2017-03-03 ENCOUNTER — Ambulatory Visit (INDEPENDENT_AMBULATORY_CARE_PROVIDER_SITE_OTHER): Payer: Self-pay | Admitting: Vascular Surgery

## 2017-03-03 ENCOUNTER — Ambulatory Visit (INDEPENDENT_AMBULATORY_CARE_PROVIDER_SITE_OTHER): Payer: Medicare Other

## 2017-03-03 DIAGNOSIS — Z8679 Personal history of other diseases of the circulatory system: Secondary | ICD-10-CM | POA: Diagnosis not present

## 2017-03-03 DIAGNOSIS — Z9889 Other specified postprocedural states: Secondary | ICD-10-CM

## 2017-03-12 ENCOUNTER — Encounter (INDEPENDENT_AMBULATORY_CARE_PROVIDER_SITE_OTHER): Payer: Self-pay

## 2017-05-18 DIAGNOSIS — F1011 Alcohol abuse, in remission: Secondary | ICD-10-CM | POA: Insufficient documentation

## 2017-05-18 DIAGNOSIS — C61 Malignant neoplasm of prostate: Secondary | ICD-10-CM | POA: Insufficient documentation

## 2017-05-18 DIAGNOSIS — E785 Hyperlipidemia, unspecified: Secondary | ICD-10-CM | POA: Insufficient documentation

## 2017-05-18 DIAGNOSIS — I252 Old myocardial infarction: Secondary | ICD-10-CM | POA: Insufficient documentation

## 2017-05-18 DIAGNOSIS — I10 Essential (primary) hypertension: Secondary | ICD-10-CM | POA: Insufficient documentation

## 2017-05-18 DIAGNOSIS — Z79899 Other long term (current) drug therapy: Secondary | ICD-10-CM | POA: Insufficient documentation

## 2017-06-06 DIAGNOSIS — R079 Chest pain, unspecified: Secondary | ICD-10-CM | POA: Insufficient documentation

## 2017-07-16 ENCOUNTER — Emergency Department: Payer: Medicare Other

## 2017-07-16 ENCOUNTER — Observation Stay
Admission: EM | Admit: 2017-07-16 | Discharge: 2017-07-20 | Disposition: A | Discharge status: Skilled Nursing Facility | Payer: Medicare Other | Attending: Internal Medicine | Admitting: Internal Medicine

## 2017-07-16 ENCOUNTER — Encounter: Payer: Self-pay | Admitting: Emergency Medicine

## 2017-07-16 DIAGNOSIS — E785 Hyperlipidemia, unspecified: Secondary | ICD-10-CM | POA: Diagnosis not present

## 2017-07-16 DIAGNOSIS — Z880 Allergy status to penicillin: Secondary | ICD-10-CM | POA: Insufficient documentation

## 2017-07-16 DIAGNOSIS — Z8546 Personal history of malignant neoplasm of prostate: Secondary | ICD-10-CM | POA: Insufficient documentation

## 2017-07-16 DIAGNOSIS — F419 Anxiety disorder, unspecified: Secondary | ICD-10-CM | POA: Insufficient documentation

## 2017-07-16 DIAGNOSIS — R627 Adult failure to thrive: Secondary | ICD-10-CM | POA: Diagnosis present

## 2017-07-16 DIAGNOSIS — R54 Age-related physical debility: Secondary | ICD-10-CM | POA: Insufficient documentation

## 2017-07-16 DIAGNOSIS — N179 Acute kidney failure, unspecified: Secondary | ICD-10-CM | POA: Diagnosis not present

## 2017-07-16 DIAGNOSIS — Z7982 Long term (current) use of aspirin: Secondary | ICD-10-CM | POA: Insufficient documentation

## 2017-07-16 DIAGNOSIS — I509 Heart failure, unspecified: Secondary | ICD-10-CM | POA: Diagnosis not present

## 2017-07-16 DIAGNOSIS — R7989 Other specified abnormal findings of blood chemistry: Secondary | ICD-10-CM | POA: Diagnosis present

## 2017-07-16 DIAGNOSIS — Z87891 Personal history of nicotine dependence: Secondary | ICD-10-CM | POA: Diagnosis not present

## 2017-07-16 DIAGNOSIS — Z79899 Other long term (current) drug therapy: Secondary | ICD-10-CM | POA: Insufficient documentation

## 2017-07-16 DIAGNOSIS — I110 Hypertensive heart disease with heart failure: Secondary | ICD-10-CM | POA: Diagnosis not present

## 2017-07-16 DIAGNOSIS — E860 Dehydration: Secondary | ICD-10-CM | POA: Diagnosis not present

## 2017-07-16 DIAGNOSIS — I2510 Atherosclerotic heart disease of native coronary artery without angina pectoris: Secondary | ICD-10-CM | POA: Diagnosis not present

## 2017-07-16 DIAGNOSIS — R945 Abnormal results of liver function studies: Secondary | ICD-10-CM

## 2017-07-16 DIAGNOSIS — I739 Peripheral vascular disease, unspecified: Secondary | ICD-10-CM | POA: Diagnosis not present

## 2017-07-16 DIAGNOSIS — R1906 Epigastric swelling, mass or lump: Secondary | ICD-10-CM

## 2017-07-16 DIAGNOSIS — F0391 Unspecified dementia with behavioral disturbance: Secondary | ICD-10-CM | POA: Diagnosis not present

## 2017-07-16 DIAGNOSIS — Z7902 Long term (current) use of antithrombotics/antiplatelets: Secondary | ICD-10-CM | POA: Diagnosis not present

## 2017-07-16 DIAGNOSIS — R1900 Intra-abdominal and pelvic swelling, mass and lump, unspecified site: Secondary | ICD-10-CM

## 2017-07-16 LAB — COMPREHENSIVE METABOLIC PANEL
ALBUMIN: 3.3 g/dL — AB (ref 3.5–5.0)
ALK PHOS: 69 U/L (ref 38–126)
ALT: 60 U/L (ref 17–63)
ANION GAP: 12 (ref 5–15)
AST: 177 U/L — ABNORMAL HIGH (ref 15–41)
BUN: 56 mg/dL — ABNORMAL HIGH (ref 6–20)
CALCIUM: 8.9 mg/dL (ref 8.9–10.3)
CO2: 29 mmol/L (ref 22–32)
CREATININE: 2.1 mg/dL — AB (ref 0.61–1.24)
Chloride: 99 mmol/L — ABNORMAL LOW (ref 101–111)
GFR calc Af Amer: 31 mL/min — ABNORMAL LOW (ref >60)
GFR calc non Af Amer: 27 mL/min — ABNORMAL LOW (ref >60)
Glucose, Bld: 99 mg/dL (ref 65–99)
Potassium: 4.7 mmol/L (ref 3.5–5.1)
SODIUM: 140 mmol/L (ref 135–145)
Total Bilirubin: 1.8 mg/dL — ABNORMAL HIGH (ref 0.3–1.2)
Total Protein: 6.8 g/dL (ref 6.5–8.1)

## 2017-07-16 LAB — URINALYSIS, COMPLETE (UACMP) WITH MICROSCOPIC
BACTERIA UA: NONE SEEN
BILIRUBIN URINE: NEGATIVE
GLUCOSE, UA: NEGATIVE mg/dL
Ketones, ur: 5 mg/dL — AB
LEUKOCYTES UA: NEGATIVE
Nitrite: NEGATIVE
Protein, ur: NEGATIVE mg/dL
Specific Gravity, Urine: 1.019 (ref 1.005–1.030)
pH: 5 (ref 5.0–8.0)

## 2017-07-16 LAB — CBC
HCT: 37.2 % — ABNORMAL LOW (ref 40.0–52.0)
HEMOGLOBIN: 12.6 g/dL — AB (ref 13.0–18.0)
MCH: 30.8 pg (ref 26.0–34.0)
MCHC: 33.8 g/dL (ref 32.0–36.0)
MCV: 91.2 fL (ref 80.0–100.0)
Platelets: 226 10*3/uL (ref 150–440)
RBC: 4.08 MIL/uL — AB (ref 4.40–5.90)
RDW: 14.6 % — ABNORMAL HIGH (ref 11.5–14.5)
WBC: 16.6 10*3/uL — ABNORMAL HIGH (ref 3.8–10.6)

## 2017-07-16 LAB — LIPASE, BLOOD: LIPASE: 26 U/L (ref 11–51)

## 2017-07-16 LAB — TSH: TSH: 1.421 u[IU]/mL (ref 0.350–4.500)

## 2017-07-16 MED ORDER — ONDANSETRON HCL 4 MG PO TABS: 4.0000 mg | ORAL_TABLET | Freq: Four times a day (QID) | ORAL | Status: DC | PRN

## 2017-07-16 MED ORDER — ASPIRIN EC 81 MG PO TBEC: 81.0000 mg | DELAYED_RELEASE_TABLET | Freq: Every day | ORAL | Status: DC

## 2017-07-16 MED ORDER — VITAMIN B-1 100 MG PO TABS
200.0000 mg | ORAL_TABLET | Freq: Every day | ORAL | Status: DC
  Administered 2017-07-16 – 2017-07-20 (×5): 200 mg via ORAL
  Filled 2017-07-16 (×6): qty 2

## 2017-07-16 MED ORDER — MEMANTINE HCL 5 MG PO TABS
10.0000 mg | ORAL_TABLET | Freq: Every day | ORAL | Status: DC
  Administered 2017-07-16 – 2017-07-19 (×4): 10 mg via ORAL
  Filled 2017-07-16 (×4): qty 2

## 2017-07-16 MED ORDER — IOPAMIDOL (ISOVUE-300) INJECTION 61%
30.0000 mL | Freq: Once | INTRAVENOUS | Status: AC
  Administered 2017-07-16: 30 mL via ORAL

## 2017-07-16 MED ORDER — ASPIRIN EC 81 MG PO TBEC
81.0000 mg | DELAYED_RELEASE_TABLET | Freq: Every day | ORAL | Status: DC
  Administered 2017-07-16 – 2017-07-20 (×5): 81 mg via ORAL
  Filled 2017-07-16 (×5): qty 1

## 2017-07-16 MED ORDER — SODIUM CHLORIDE 0.9 % IV SOLN
INTRAVENOUS | Status: DC
  Administered 2017-07-16 – 2017-07-19 (×5): via INTRAVENOUS

## 2017-07-16 MED ORDER — HEPARIN SODIUM (PORCINE) 5000 UNIT/ML IJ SOLN
5000.0000 [IU] | Freq: Three times a day (TID) | INTRAMUSCULAR | Status: DC
  Administered 2017-07-16 – 2017-07-19 (×9): 5000 [IU] via SUBCUTANEOUS
  Filled 2017-07-16 (×9): qty 1

## 2017-07-16 MED ORDER — ONDANSETRON HCL 4 MG/2ML IJ SOLN: 4.0000 mg | Freq: Four times a day (QID) | INTRAMUSCULAR | Status: DC | PRN

## 2017-07-16 MED ORDER — MAGNESIUM OXIDE 400 (241.3 MG) MG PO TABS
400.0000 mg | ORAL_TABLET | Freq: Every day | ORAL | Status: DC
  Administered 2017-07-16 – 2017-07-19 (×4): 400 mg via ORAL
  Filled 2017-07-16 (×4): qty 1

## 2017-07-16 MED ORDER — SODIUM CHLORIDE 0.9 % IV BOLUS (SEPSIS)
500.0000 mL | Freq: Once | INTRAVENOUS | Status: AC
  Administered 2017-07-16: 500 mL via INTRAVENOUS

## 2017-07-16 MED ORDER — FOLIC ACID 1 MG PO TABS
1.0000 mg | ORAL_TABLET | Freq: Every day | ORAL | Status: DC
  Administered 2017-07-16 – 2017-07-20 (×5): 1 mg via ORAL
  Filled 2017-07-16 (×5): qty 1

## 2017-07-16 MED ORDER — DONEPEZIL HCL 5 MG PO TABS
10.0000 mg | ORAL_TABLET | Freq: Every day | ORAL | Status: DC
  Administered 2017-07-16 – 2017-07-19 (×4): 10 mg via ORAL
  Filled 2017-07-16 (×4): qty 2
  Filled 2017-07-16: qty 1

## 2017-07-16 MED ORDER — MIRTAZAPINE 15 MG PO TABS
30.0000 mg | ORAL_TABLET | Freq: Every day | ORAL | Status: DC
  Administered 2017-07-16 – 2017-07-19 (×4): 30 mg via ORAL
  Filled 2017-07-16 (×4): qty 2

## 2017-07-16 MED ORDER — ALBUTEROL SULFATE (2.5 MG/3ML) 0.083% IN NEBU: 2.5000 mg | INHALATION_SOLUTION | RESPIRATORY_TRACT | Status: DC | PRN

## 2017-07-16 MED ORDER — SENNOSIDES-DOCUSATE SODIUM 8.6-50 MG PO TABS: 1.0000 | ORAL_TABLET | Freq: Every evening | ORAL | Status: DC | PRN

## 2017-07-16 NOTE — H&P (Signed)
[place] [facility] [place] NAME: Kyle Whitaker    MR#:  [id number]  DATE OF BIRTH:  1930-07-05  DATE OF ADMISSION:  07/16/2017  PRIMARY CARE PHYSICIAN: Maryland Pink, MD   REQUESTING/REFERRING PHYSICIAN: Lavonia Drafts, MD  CHIEF COMPLAINT:   Chief Complaint  Patient presents with  . Failure To Thrive   Generalized weakness and poor appetite.  HISTORY OF PRESENT ILLNESS:  Kyle Whitaker  is a 83 y.o. male with a known history of Hypertension, processes cancer, CHF, CAD and PVD. The patient presents to the ED with the above chief complaints. The patient has had generalized weakness and poor oral intake for the past one week. The patient denies any fever or chills, no cough, shortness of breath, nausea or vomiting. He denies any abdominal pain, diarrhea, melena or bloody stool. He was found renal failure and the liver mass by ultrasound. He said he is bedbound, unable to walk.  PAST MEDICAL HISTORY:   Past Medical History:  Diagnosis Date  . Anxiety   . Arthritis   . Cancer [facility]) 2005   Prostate  . CHF (congestive heart failure) ([place])   . Coronary artery disease   . Hyperlipidemia   . Hypertension   . Peripheral vascular disease ([place])    carotid stenosis  . Shortness of breath dyspnea    on exertion    PAST SURGICAL HISTORY:   Past Surgical History:  Procedure Laterality Date  . CATARACT EXTRACTION W/PHACO Right 12/10/2016   Procedure: CATARACT EXTRACTION PHACO AND INTRAOCULAR LENS PLACEMENT (IOC);  Surgeon: Eulogio Bear, MD;  Location: [facility] ORS;  Service: Ophthalmology;  Laterality: Right;  Lot #[id number] H US:01:31.2 AP%:18.4 CDE:17.30  . CATARACT EXTRACTION W/PHACO Left 01/07/2017   Procedure: CATARACT EXTRACTION PHACO AND INTRAOCULAR LENS PLACEMENT (IOC);  Surgeon: Eulogio Bear, MD;  Location: [facility] ORS;  Service: Ophthalmology;  Laterality: Left;  Lot# [id number] H [place]: 01:05.8 AP%: 16.3 CDE: 10.72  . ENDARTERECTOMY Right  07/17/2016   Procedure: ENDARTERECTOMY CAROTID;  Surgeon: Katha Cabal, MD;  Location: [facility] ORS;  Service: Vascular;  Laterality: Right;  . JOINT REPLACEMENT Right 2011   Total Hip Replacement, [facility]  . JOINT REPLACEMENT Right 2011   Total Knee Replacement, [facility]  . JOINT REPLACEMENT Left 2015   total hip replacement  . KNEE ARTHROSCOPY Right 2011  . PROSTATE SURGERY    . TOTAL HIP ARTHROPLASTY Left 10/29/2015   Procedure: TOTAL HIP ARTHROPLASTY ANTERIOR APPROACH;  Surgeon: Hessie Knows, MD;  Location: [facility] ORS;  Service: Orthopedics;  Laterality: Left;    SOCIAL HISTORY:   Social History  Substance Use Topics  . Smoking status: Former Smoker    Packs/day: 1.00    Types: Cigarettes  . Smokeless tobacco: Never Used     Comment: quit 50 years ago  . Alcohol use No    FAMILY HISTORY:   Family History  Problem Relation Age of Onset  . CAD Mother     DRUG ALLERGIES:   Allergies  Allergen Reactions  . Vicodin [Hydrocodone-Acetaminophen] Shortness Of Breath  . Penicillins Itching    Has patient had a PCN reaction causing immediate rash, facial/tongue/throat swelling, SOB or lightheadedness with hypotension: No Has patient had a PCN reaction causing severe rash involving mucus membranes or skin necrosis: No Has patient had a PCN reaction that required hospitalization No Has patient had a PCN reaction occurring within the last 10 years: No If all of the above answers are "NO", then may  proceed with Cephalosporin use.     REVIEW OF SYSTEMS:   Review of Systems  Constitutional: Positive for malaise/fatigue. Negative for chills and fever.  HENT: Negative for sore throat.   Eyes: Negative for blurred vision and double vision.  Respiratory: Negative for cough, hemoptysis, shortness of breath, wheezing and stridor.   Cardiovascular: Negative for chest pain, palpitations, orthopnea and leg swelling.  Gastrointestinal: Negative for abdominal pain, blood in stool, diarrhea,  melena, nausea and vomiting.  Genitourinary: Negative for dysuria, flank pain and hematuria.  Musculoskeletal: Negative for back pain and joint pain.  Neurological: Positive for dizziness and weakness. Negative for sensory change, focal weakness, seizures, loss of consciousness and headaches.  Endo/Heme/Allergies: Negative for polydipsia.  Psychiatric/Behavioral: Negative for depression. The patient is not nervous/anxious.     MEDICATIONS AT HOME:   Prior to Admission medications   Medication Sig Start Date End Date Taking? Authorizing Provider  aspirin EC 81 MG EC tablet Take 1 tablet (81 mg total) by mouth daily. 07/18/16  Yes Algernon Huxley, MD  atenolol (TENORMIN) 25 MG tablet Take 25 mg by mouth daily.   Yes [provider]  donepezil (ARICEPT) 10 MG tablet Take 10 mg by mouth at bedtime.   Yes [provider]  folic acid (FOLVITE) 1 MG tablet Take 1 mg by mouth daily.   Yes [provider]  hydrochlorothiazide (HYDRODIURIL) 25 MG tablet Take 25 mg by mouth daily.   Yes [provider]  lisinopril (PRINIVIL,ZESTRIL) 20 MG tablet Take 20 mg by mouth daily.   Yes [provider]  Magnesium Oxide 500 MG TABS Take 500 mg by mouth at bedtime.   Yes [provider]  memantine (NAMENDA) 10 MG tablet Take 10 mg by mouth at bedtime.   Yes [provider]  mirtazapine (REMERON) 30 MG tablet Take 30 mg by mouth at bedtime.   Yes [provider]  thiamine 100 MG tablet Take 200 mg by mouth daily.   Yes [provider]  Vitamin D, Ergocalciferol, (DRISDOL) 50000 units CAPS capsule Take 50,000 Units by mouth once a week. Fridays   Yes [provider]  zinc gluconate 50 MG tablet Take 50 mg by mouth at bedtime.   Yes [provider]  amLODipine (NORVASC) 5 MG tablet Take 1 tablet (5 mg total) by mouth daily. Patient not taking: Reported on 12/09/2016 09/21/16   Gladstone Lighter, MD  clopidogrel (PLAVIX)  75 MG tablet Take 1 tablet (75 mg total) by mouth daily. Patient not taking: Reported on 12/09/2016 07/18/16   Algernon Huxley, MD  diazepam (VALIUM) 2 MG tablet Take 1 tablet (2 mg total) by mouth 2 (two) times daily. Patient not taking: Reported on 07/16/2017 09/20/16   Gladstone Lighter, MD  risperiDONE (RISPERDAL M-TABS) 0.5 MG disintegrating tablet Take 1 tablet (0.5 mg total) by mouth at bedtime. Patient not taking: Reported on 12/09/2016 09/20/16   Gladstone Lighter, MD      VITAL SIGNS:  Blood pressure (!) 125/48, pulse 70, temperature 97.6 F (36.4 C), temperature source Oral, resp. rate 18, height 5\' 9"  (1.753 m), weight 170 lb (77.1 kg), SpO2 100 %.  PHYSICAL EXAMINATION:  Physical Exam  GENERAL:  83 y.o.-year-old patient lying in the bed with no acute distress.  EYES: Pupils equal, round, reactive to light and accommodation. No scleral icterus. Extraocular muscles intact.  HEENT: Head atraumatic, normocephalic. Oropharynx and nasopharynx clear. Dry oral mucosa. NECK:  Supple, no jugular venous distention. No thyroid  enlargement, no tenderness.  LUNGS: Normal breath sounds bilaterally, no wheezing, rales,rhonchi or crepitation. No use of accessory muscles of respiration.  CARDIOVASCULAR: S1, S2 normal. No murmurs, rubs, or gallops.  ABDOMEN: Soft, nontender, nondistended. Bowel sounds present. No organomegaly or mass.  EXTREMITIES: No pedal edema, cyanosis, or clubbing.  NEUROLOGIC: Cranial nerves II through XII are intact. Muscle strength 2/5 in all extremities. Sensation intact. Gait not checked.  PSYCHIATRIC: The patient is alert and oriented x 3.  SKIN: No obvious rash, lesion, or ulcer.   LABORATORY PANEL:   CBC  Recent Labs Lab 07/16/17 0929  WBC 16.6*  HGB 12.6*  HCT 37.2*  PLT 226   ------------------------------------------------------------------------------------------------------------------  Chemistries   Recent Labs Lab 07/16/17 0929  NA 140  K 4.7    CL 99*  CO2 29  GLUCOSE 99  BUN 56*  CREATININE 2.10*  CALCIUM 8.9  AST 177*  ALT 60  ALKPHOS 69  BILITOT 1.8*   ------------------------------------------------------------------------------------------------------------------  Cardiac Enzymes No results for input(s): TROPONINI in the last 168 hours. ------------------------------------------------------------------------------------------------------------------  RADIOLOGY:  Dg Chest Portable 1 View  Result Date: 07/16/2017 CLINICAL DATA:  Worsening weakness.  Decreased appetite for 1 week. EXAM: PORTABLE CHEST 1 VIEW COMPARISON:  None. FINDINGS: There is no focal parenchymal opacity. There is no pleural effusion or pneumothorax. The heart and mediastinal contours are unremarkable. There is mild osteoarthritis of the right glenohumeral joint. There is old posttraumatic deformity of the right distal clavicle. IMPRESSION: No active cardiopulmonary disease. Electronically Signed   By: Kathreen Devoid   On: 07/16/2017 09:32   US Abdomen Limited Ruq  Result Date: 07/16/2017 CLINICAL DATA:  Elevated LFTs. EXAM: ULTRASOUND ABDOMEN LIMITED RIGHT UPPER QUADRANT COMPARISON:  None. FINDINGS: Gallbladder: No gallstones or wall thickening visualized. No sonographic Murphy sign noted by sonographer. Common bile duct: Diameter: 2.8 mm Liver: No focal lesion identified. Within normal limits in parenchymal echogenicity. Other: Indeterminate, solid appearing hypoechoic structure is identified within the upper abdomen measuring 3.6 x 1.8 x 2.9 cm. IMPRESSION: 1. Normal appearance of the liver and gallbladder. 2. There is an indeterminate, solid-appearing and hypoechoic structure within the upper abdomen measuring 3.6 cm. Cannot rule out mass or adenopathy. Recommend further investigation with contrast enhanced CT or MRI of the upper abdomen. These results will be called to the ordering clinician or representative by the Radiologist Assistant, and communication  documented in the PACS or zVision Dashboard. Electronically Signed   By: Kerby Moors M.D.   On: 07/16/2017 12:35      IMPRESSION AND PLAN:   Acute renal failure due to dehydration. The patient is admitted to medical floor. Start normal saline IV, hold HCTZ and lisinopril, follow-up BMP. Leukocytosis. Unclear etiology, urinalysis is unremarkable, chest x-ray is unremarkable. Follow-up CBC. Hypertension. Hold all home hypertension medication due to low side blood pressure. Generalized weakness. PT evaluation after renal failure improves.  Liver mass.  ultrasound shows unremarkable gallbladder however possible mass. The patient to need CAT scan of abdomen and pelvis with contrast after renal function improves.  Abnormal liver function tests with Elevated bilirubin. Possible related to liver mass. CAD. Continue aspirin and Plavix. Chronic CHF. Unknown type. Stable. Poor oral intake. Dietitian consult.  All the records are reviewed and case discussed with ED provider. Management plans discussed with the patient, family and they are in agreement.  CODE STATUS: Full code  TOTAL TIME TAKING CARE OF THIS PATIENT: 58 minutes.    Demetrios Loll M.D on 07/16/2017 at 5:53 PM  Between 7am to 6pm - Pager - [phone]  After 6pm go to [email or web address] - Proofreader  [facility]  Office  [phone]  CC: Primary care physician; Maryland Pink, MD   Note: This dictation was prepared with Dragon dictation along with smaller phrase technology. Any transcriptional errors that result from this process are unintentional.

## 2017-07-16 NOTE — ED Notes (Signed)
Pt returned from US

## 2017-07-16 NOTE — Progress Notes (Signed)
Pt arrived to room 209. VSS. Upon assessment, pt has a right skin tear on his knee foam dressing was applied and second verification was with Herby Abraham, RN. Will continue to monitor pt.   [place] [employer] [employer]

## 2017-07-16 NOTE — ED Triage Notes (Signed)
Pt to ED by EMS. Son told EMS that the pt has had decreased appetite, increased depression and decreased mobility that has progressed over the last week. Pt denies pain and VS WDL.

## 2017-07-16 NOTE — ED Provider Notes (Signed)
[facility] Emergency Department Provider Note   ____________________________________________    I have reviewed the triage vital signs and the nursing notes.   HISTORY  Chief Complaint Failure To Thrive  History primarily per EMS, patient has dementia   HPI Kyle Whitaker is a 83 y.o. male who presents with generalized weakness. EMS reports the patient is cared for by his son and the son reported that over the last week the patient has not been eating has had decreased mobility and has been feeling quite weak. No fevers reported. No reports of cough or nausea or vomiting. Patient reports he just has not been hungry.   Past Medical History:  Diagnosis Date  . Anxiety   . Arthritis   . Cancer [facility]) 2005   Prostate  . CHF (congestive heart failure) ([place])   . Coronary artery disease   . Hyperlipidemia   . Hypertension   . Peripheral vascular disease ([place])    carotid stenosis  . Shortness of breath dyspnea    on exertion    Patient Active Problem List   Diagnosis Date Noted  . Hallucinations 09/19/2016  . Mild dementia 09/19/2016  . Spells of formed visual hallucinations 09/19/2016  . Carotid stenosis 07/17/2016  . Shortness of breath 06/28/2016  . Carotid artery stenosis   . Chronic coronary artery disease   . Primary osteoarthritis of left hip 10/29/2015    Past Surgical History:  Procedure Laterality Date  . CATARACT EXTRACTION W/PHACO Right 12/10/2016   Procedure: CATARACT EXTRACTION PHACO AND INTRAOCULAR LENS PLACEMENT (IOC);  Surgeon: Eulogio Bear, MD;  Location: [facility] ORS;  Service: Ophthalmology;  Laterality: Right;  Lot #[id number] H US:01:31.2 AP%:18.4 CDE:17.30  . CATARACT EXTRACTION W/PHACO Left 01/07/2017   Procedure: CATARACT EXTRACTION PHACO AND INTRAOCULAR LENS PLACEMENT (IOC);  Surgeon: Eulogio Bear, MD;  Location: [facility] ORS;  Service: Ophthalmology;  Laterality: Left;  Lot# [id number] H [place]: 01:05.8 AP%: 16.3 CDE:  10.72  . ENDARTERECTOMY Right 07/17/2016   Procedure: ENDARTERECTOMY CAROTID;  Surgeon: Katha Cabal, MD;  Location: [facility] ORS;  Service: Vascular;  Laterality: Right;  . JOINT REPLACEMENT Right 2011   Total Hip Replacement, [facility]  . JOINT REPLACEMENT Right 2011   Total Knee Replacement, [facility]  . JOINT REPLACEMENT Left 2015   total hip replacement  . KNEE ARTHROSCOPY Right 2011  . PROSTATE SURGERY    . TOTAL HIP ARTHROPLASTY Left 10/29/2015   Procedure: TOTAL HIP ARTHROPLASTY ANTERIOR APPROACH;  Surgeon: Hessie Knows, MD;  Location: [facility] ORS;  Service: Orthopedics;  Laterality: Left;    Prior to Admission medications   Medication Sig Start Date End Date Taking? Authorizing Provider  aspirin EC 81 MG EC tablet Take 1 tablet (81 mg total) by mouth daily. 07/18/16  Yes Algernon Huxley, MD  atenolol (TENORMIN) 25 MG tablet Take 25 mg by mouth daily.   Yes [provider]  donepezil (ARICEPT) 10 MG tablet Take 10 mg by mouth at bedtime.   Yes [provider]  folic acid (FOLVITE) 1 MG tablet Take 1 mg by mouth daily.   Yes [provider]  hydrochlorothiazide (HYDRODIURIL) 25 MG tablet Take 25 mg by mouth daily.   Yes [provider]  lisinopril (PRINIVIL,ZESTRIL) 20 MG tablet Take 20 mg by mouth daily.   Yes [provider]  Magnesium Oxide 500 MG TABS Take 500 mg by mouth at bedtime.   Yes [provider]  memantine (NAMENDA) 10 MG tablet Take 10  mg by mouth at bedtime.   Yes [provider]  mirtazapine (REMERON) 30 MG tablet Take 30 mg by mouth at bedtime.   Yes [provider]  thiamine 100 MG tablet Take 200 mg by mouth daily.   Yes [provider]  Vitamin D, Ergocalciferol, (DRISDOL) 50000 units CAPS capsule Take 50,000 Units by mouth once a week. Fridays   Yes [provider]  zinc gluconate 50 MG tablet Take 50 mg by mouth at bedtime.   Yes [provider]  amLODipine (NORVASC) 5 MG tablet  Take 1 tablet (5 mg total) by mouth daily. Patient not taking: Reported on 12/09/2016 09/21/16   Gladstone Lighter, MD  clopidogrel (PLAVIX) 75 MG tablet Take 1 tablet (75 mg total) by mouth daily. Patient not taking: Reported on 12/09/2016 07/18/16   Algernon Huxley, MD  diazepam (VALIUM) 2 MG tablet Take 1 tablet (2 mg total) by mouth 2 (two) times daily. Patient not taking: Reported on 07/16/2017 09/20/16   Gladstone Lighter, MD  risperiDONE (RISPERDAL M-TABS) 0.5 MG disintegrating tablet Take 1 tablet (0.5 mg total) by mouth at bedtime. Patient not taking: Reported on 12/09/2016 09/20/16   Gladstone Lighter, MD     Allergies Vicodin [hydrocodone-acetaminophen] and Penicillins  Family History  Problem Relation Age of Onset  . CAD Mother     Social History Social History  Substance Use Topics  . Smoking status: Former Smoker    Packs/day: 1.00    Types: Cigarettes  . Smokeless tobacco: Never Used     Comment: quit 50 years ago  . Alcohol use No    Review of Systems  Constitutional: Diffuse weakness Eyes: Patient denies visual changes.  ENT: Patient denies sore throat. Cardiovascular: Denies chest pain. Respiratory: Denies shortness of breath. No cough Gastrointestinal: No abdominal pain. no vomiting.   Genitourinary: Negative for dysuria. Musculoskeletal: Intermittent mid back pain, chronic Skin: Negative for rash. Neurological: Negative for headaches    ____________________________________________   PHYSICAL EXAM:  VITAL SIGNS: ED Triage Vitals [07/16/17 0908]  Enc Vitals Group     BP 118/78     Pulse Rate (!) 56     Resp 15     Temp 98 F (36.7 C)     Temp Source Oral     SpO2 91 %     Weight      Height      Head Circumference      Peak Flow      Pain Score      Pain Loc      Pain Edu?      Excl. in [place]?     Constitutional: Alert.  No acute distress.  Eyes: Conjunctivae are normal.  Head: Atraumatic. Nose: No congestion/rhinnorhea. Mouth/Throat:  Mucous membranes are Dry Neck:  Painless ROM Cardiovascular: Normal rate, regular rhythm. Grossly normal heart sounds.  Good peripheral circulation. Respiratory: Normal respiratory effort.  No retractions. Lungs CTAB. Gastrointestinal: Soft and nontender. No distention.  No CVA tenderness. Genitourinary: No rash or erythema Musculoskeletal: .  Warm and well perfused Neurologic:  Normal speech and language. No gross focal neurologic deficits are appreciated.  Skin:  Skin is warm, dry and intact. No rash noted.   ____________________________________________   LABS (all labs ordered are listed, but only abnormal results are displayed)  Labs Reviewed  CBC - Abnormal; Notable for the following:       Result Value   WBC 16.6 (*)    RBC 4.08 (*)  Hemoglobin 12.6 (*)    HCT 37.2 (*)    RDW 14.6 (*)    All other components within normal limits  COMPREHENSIVE METABOLIC PANEL - Abnormal; Notable for the following:    Chloride 99 (*)    BUN 56 (*)    Creatinine, Ser 2.10 (*)    Albumin 3.3 (*)    AST 177 (*)    Total Bilirubin 1.8 (*)    GFR calc non Af Amer 27 (*)    GFR calc Af Amer 31 (*)    All other components within normal limits  URINALYSIS, COMPLETE (UACMP) WITH MICROSCOPIC - Abnormal; Notable for the following:    Color, Urine YELLOW (*)    APPearance CLOUDY (*)    Hgb urine dipstick MODERATE (*)    Ketones, ur 5 (*)    Squamous Epithelial / LPF 0-5 (*)    All other components within normal limits  URINE CULTURE  LIPASE, BLOOD  TSH   ____________________________________________  EKG  ED ECG REPORT I, Lavonia Drafts, the attending physician, personally viewed and interpreted this ECG.  Date: 07/16/2017 EKG Time: 9:09 AM Rate: 62 Rhythm: normal sinus rhythm QRS Axis: normal Intervals: Left bundle branch block ST/T Wave abnormalities: PVCs noted   ____________________________________________  RADIOLOGY  Chest x-ray unremarkable, Ultrasound questionable  mass ____________________________________________   PROCEDURES  Procedure(s) performed: No    Critical Care performed: No ____________________________________________   INITIAL IMPRESSION / ASSESSMENT AND PLAN / ED COURSE  Pertinent labs & imaging results that were available during my care of the patient were reviewed by me and considered in my medical decision making (see chart for details).  Patient presents with generalized weakness/failure to thrive. Vital signs are reassuring, we will evaluate labs, chest x-ray, urinalysis and discussed with son when he arrives.  Lab work is consistent with dehydration/acute kidney injury. Mild elevation in LFTs as well, ultrasound shows unremarkable gallbladder however possible mass. I will order CT scan which will be followed up on by the hospitalist, I confirmed this with Dr. Bridgett Larsson    ____________________________________________   FINAL CLINICAL IMPRESSION(S) / ED DIAGNOSES  Final diagnoses:  Elevated LFTs  Acute kidney injury ([place])  Dehydration  Failure to thrive in adult  Epigastric mass      NEW MEDICATIONS STARTED DURING THIS VISIT:  New Prescriptions   No medications on file     Note:  This document was prepared using Dragon voice recognition software and may include unintentional dictation errors.    Lavonia Drafts, MD 07/16/17 1253

## 2017-07-17 ENCOUNTER — Inpatient Hospital Stay: Payer: Medicare Other

## 2017-07-17 DIAGNOSIS — E860 Dehydration: Secondary | ICD-10-CM | POA: Diagnosis not present

## 2017-07-17 LAB — BASIC METABOLIC PANEL
ANION GAP: 10 (ref 5–15)
BUN: 41 mg/dL — ABNORMAL HIGH (ref 6–20)
CALCIUM: 8.2 mg/dL — AB (ref 8.9–10.3)
CO2: 26 mmol/L (ref 22–32)
CREATININE: 1.34 mg/dL — AB (ref 0.61–1.24)
Chloride: 104 mmol/L (ref 101–111)
GFR, EST AFRICAN AMERICAN: 54 mL/min — AB (ref >60)
GFR, EST NON AFRICAN AMERICAN: 47 mL/min — AB (ref >60)
Glucose, Bld: 84 mg/dL (ref 65–99)
Potassium: 3.7 mmol/L (ref 3.5–5.1)
SODIUM: 140 mmol/L (ref 135–145)

## 2017-07-17 LAB — COMPREHENSIVE METABOLIC PANEL
ALBUMIN: 2.7 g/dL — AB (ref 3.5–5.0)
ALK PHOS: 53 U/L (ref 38–126)
ALT: 55 U/L (ref 17–63)
AST: 131 U/L — AB (ref 15–41)
Anion gap: 8 (ref 5–15)
BUN: 40 mg/dL — AB (ref 6–20)
CALCIUM: 8.2 mg/dL — AB (ref 8.9–10.3)
CHLORIDE: 105 mmol/L (ref 101–111)
CO2: 30 mmol/L (ref 22–32)
CREATININE: 1.3 mg/dL — AB (ref 0.61–1.24)
GFR calc Af Amer: 56 mL/min — ABNORMAL LOW (ref >60)
GFR calc non Af Amer: 48 mL/min — ABNORMAL LOW (ref >60)
GLUCOSE: 95 mg/dL (ref 65–99)
Potassium: 3.8 mmol/L (ref 3.5–5.1)
SODIUM: 143 mmol/L (ref 135–145)
Total Bilirubin: 1 mg/dL (ref 0.3–1.2)
Total Protein: 5.5 g/dL — ABNORMAL LOW (ref 6.5–8.1)

## 2017-07-17 LAB — CBC
HCT: 34.2 % — ABNORMAL LOW (ref 40.0–52.0)
HEMOGLOBIN: 11.6 g/dL — AB (ref 13.0–18.0)
MCH: 31.1 pg (ref 26.0–34.0)
MCHC: 33.8 g/dL (ref 32.0–36.0)
MCV: 92 fL (ref 80.0–100.0)
PLATELETS: 198 10*3/uL (ref 150–440)
RBC: 3.72 MIL/uL — AB (ref 4.40–5.90)
RDW: 14.4 % (ref 11.5–14.5)
WBC: 11 10*3/uL — AB (ref 3.8–10.6)

## 2017-07-17 MED ORDER — ACETAMINOPHEN 325 MG PO TABS
650.0000 mg | ORAL_TABLET | Freq: Four times a day (QID) | ORAL | Status: DC | PRN
  Administered 2017-07-17: 650 mg via ORAL
  Filled 2017-07-17: qty 2

## 2017-07-17 NOTE — Progress Notes (Signed)
[facility] [place] NAME: Kyle Whitaker    MR#:  [id number]  DATE OF BIRTH:  [birth year]  SUBJECTIVE: Admitted for acute kidney injury, elevated liver functions. Treatment is demented and he is mumbling.   CHIEF COMPLAINT:   Chief Complaint  Patient presents with  . Failure To Thrive    REVIEW OF SYSTEMS:    Review of Systems  Unable to perform ROS: Dementia    Nutrition:  Tolerating Diet: Tolerating PT:      DRUG ALLERGIES:   Allergies  Allergen Reactions  . Vicodin [Hydrocodone-Acetaminophen] Shortness Of Breath  . Penicillins Itching    Has patient had a PCN reaction causing immediate rash, facial/tongue/throat swelling, SOB or lightheadedness with hypotension: No Has patient had a PCN reaction causing severe rash involving mucus membranes or skin necrosis: No Has patient had a PCN reaction that required hospitalization No Has patient had a PCN reaction occurring within the last 10 years: No If all of the above answers are "NO", then may proceed with Cephalosporin use.     VITALS:  Blood pressure (!) 125/50, pulse 79, temperature 98.3 F (36.8 C), temperature source Oral, resp. rate 20, height 5\' 9"  (1.753 m), weight 77.1 kg (170 lb), SpO2 99 %.  PHYSICAL EXAMINATION:   Physical Exam  GENERAL:  83 y.o.-year-old patient lying in the bed with no acute distress.  EYES: Pupils equal, round, reactive to light and accommodation. No scleral icterus. Extraocular muscles intact.  HEENT: Head atraumatic, normocephalic. Oropharynx and nasopharynx clear.  NECK:  Supple, no jugular venous distention. No thyroid enlargement, no tenderness.  LUNGS: Normal breath sounds bilaterally, no wheezing, rales,rhonchi or crepitation. No use of accessory muscles of respiration.  CARDIOVASCULAR: S1, S2 normal. No murmurs, rubs, or gallops.  ABDOMEN: Soft, nontender, nondistended. Bowel sounds present. No organomegaly or mass.   EXTREMITIES: No pedal edema, cyanosis, or clubbing.  NEUROLOGIC: Dementia not able to  Follow commands for neuroExam due to that. PSYCHIATRIC: The patient is alert and oriented x 3.  SKIN: No obvious rash, lesion, or ulcer.    LABORATORY PANEL:   CBC  Recent Labs Lab 07/17/17 0415  WBC 11.0*  HGB 11.6*  HCT 34.2*  PLT 198   ------------------------------------------------------------------------------------------------------------------  Chemistries   Recent Labs Lab 07/16/17 0929 07/17/17 0415  NA 140 140  K 4.7 3.7  CL 99* 104  CO2 29 26  GLUCOSE 99 84  BUN 56* 41*  CREATININE 2.10* 1.34*  CALCIUM 8.9 8.2*  AST 177*  --   ALT 60  --   ALKPHOS 69  --   BILITOT 1.8*  --    ------------------------------------------------------------------------------------------------------------------  Cardiac Enzymes No results for input(s): TROPONINI in the last 168 hours. ------------------------------------------------------------------------------------------------------------------  RADIOLOGY:  Dg Chest Portable 1 View  Result Date: 07/16/2017 CLINICAL DATA:  Worsening weakness.  Decreased appetite for 1 week. EXAM: PORTABLE CHEST 1 VIEW COMPARISON:  None. FINDINGS: There is no focal parenchymal opacity. There is no pleural effusion or pneumothorax. The heart and mediastinal contours are unremarkable. There is mild osteoarthritis of the right glenohumeral joint. There is old posttraumatic deformity of the right distal clavicle. IMPRESSION: No active cardiopulmonary disease. Electronically Signed   By: Kathreen Devoid   On: 07/16/2017 09:32   US Abdomen Limited Ruq  Result Date: 07/16/2017 CLINICAL DATA:  Elevated LFTs. EXAM: ULTRASOUND ABDOMEN LIMITED RIGHT UPPER QUADRANT COMPARISON:  None. FINDINGS: Gallbladder: No gallstones or wall thickening visualized. No sonographic Murphy sign noted  by sonographer. Common bile duct: Diameter: 2.8 mm Liver: No focal lesion identified.  Within normal limits in parenchymal echogenicity. Other: Indeterminate, solid appearing hypoechoic structure is identified within the upper abdomen measuring 3.6 x 1.8 x 2.9 cm. IMPRESSION: 1. Normal appearance of the liver and gallbladder. 2. There is an indeterminate, solid-appearing and hypoechoic structure within the upper abdomen measuring 3.6 cm. Cannot rule out mass or adenopathy. Recommend further investigation with contrast enhanced CT or MRI of the upper abdomen. These results will be called to the ordering clinician or representative by the Radiologist Assistant, and communication documented in the PACS or zVision Dashboard. Electronically Signed   By: Kerby Moors M.D.   On: 07/16/2017 12:35     ASSESSMENT AND PLAN:   Active Problems:   ARF (acute renal failure) (HCC)   : Acute renal failure due to dehydration: Improving with IV fluids. Avoid for toxic agents. Continue IV hydration for today. Hold HCTZ, lisinopril. #2 upper abdominal mass: Indeterminate. Elevated liver functions. Heart or MRI of upper abdomen today #3 CAD: Continue aspirin, Plavix. Dementia, poor by mouth intake: Dietitian consulted.  5 leukocytosis: Likely reactive: Improving.    All the records are reviewed and case discussed with Care Management/Social Workerr. Management plans discussed with the patient, family and they are in agreement.  CODE STATUS:full  TOTAL TIME TAKING CARE OF THIS PATIENT:35 minutes.   POSSIBLE D/C IN 1-2 DAYS, DEPENDING ON CLINICAL CONDITION.   Epifanio Lesches M.D on 07/17/2017 at 9:41 AM  Between 7am to 6pm - Pager - [phone]  After 6pm go to [email or web address] - password [email or web address] [facility]  [facility]  Office  [phone]  CC: Primary care physician; [place] Pink, MD

## 2017-07-17 NOTE — Evaluation (Signed)
Physical Therapy Evaluation Patient Details Name: Kyle Whitaker MRN: [id number] DOB: 08-01-1930 Today's Date: 07/17/2017   History of Present Illness  Pt is a 83 y/o M who presented with weakness and poor appetite. Pt admitted with acute renal failure due to dehydration.  Ultrasound showed unremarkable gallbladder however possible mass.  Pt's PMH includes CHF, prostate cancer, CAD, THA, TKA.    Clinical Impression  Pt admitted with above diagnosis. Pt currently with functional limitations due to the deficits listed below (see PT Problem List). Mr. Thompson presents with BUE and BLE weakness but is agreeable to therapy.  Unsure of pt's PLOF or home layout as unsure of reliability of information provided by pt.  Pt often mumbles about tangential topics and is often unintelligible.  He requires mod assist for bed mobility and mod assist for sit>stand and stand pivot transfers.  He demonstrates poor safety awareness when using RW and requires constant cues for upright posture which he is unable to maintain.  Given pt's current mobility status, recommending SNF at d/c.  Pt will benefit from skilled PT to increase their independence and safety with mobility to allow discharge to the venue listed below.      Follow Up Recommendations SNF    Equipment Recommendations  Other (comment) (TBD at next venue of care)    Recommendations for Other Services       Precautions / Restrictions Precautions Precautions: Fall Restrictions Weight Bearing Restrictions: No      Mobility  Bed Mobility Overal bed mobility: Needs Assistance Bed Mobility: Supine to Sit     Supine to sit: Mod assist;HOB elevated     General bed mobility comments: Pt requires increased time and effort with cues for sequencing.  Min assist to manage BLEs to EOB.  Mod assist to assist with pushing up into sitting and use of bed pad to assist pt to scoot to EOB.  Transfers Overall transfer level: Needs assistance Equipment used:  Rolling walker (2 wheeled) Transfers: Sit to/from [employer] Sit to Stand: Mod assist;From elevated surface Stand pivot transfers: Mod assist       General transfer comment: Mod assist to boost to standing with bed elevated for ease of stand.  Cues for proper hand placement and safe technique using RW.  Mod assist to remain steady during pivot and for management and positioning of his RW.  Flexed posture throughout which improve temporarily after each verbal cue for upright posture.  Ambulation/Gait             General Gait Details: Not safe to attempt at this time as pt requires mod assist with pivot transfer and fatigues quickly.  Stairs            Wheelchair Mobility    Modified Rankin (Stroke Patients Only)       Balance Overall balance assessment: Needs assistance;History of Falls Sitting-balance support: No upper extremity supported;Feet supported Sitting balance-Leahy Scale: Fair Sitting balance - Comments: Pt able to sit without UE support but would likely lose his balance with perturbation   Standing balance support: Bilateral upper extremity supported;During functional activity Standing balance-Leahy Scale: Poor Standing balance comment: Pt relies on BUE support for static and dynamic activities                             Pertinent Vitals/Pain Pain Assessment: No/denies pain    Home Living Family/patient expects to be discharged to:: Private residence Living Arrangements:  Children (son) Available Help at Discharge: Family;Available PRN/intermittently (pt reports son works during the day) Type of Home: House Home Access: Stairs to enter Entrance Stairs-Rails: Right Technical brewer of Steps: 2 Home Layout: One level   Additional Comments: Information taken from pt, unsure of reliability as pt mumbling tangential thoughts throughout    Prior Function Level of Independence: Needs assistance   Gait / Transfers  Assistance Needed: Per chart review pt was ambulating with RW PTA.  Pt denies any falls but per RN the son says the pt had a fall with resultant L knee abrasion (currently covered with bandage)  ADL's / Homemaking Assistance Needed: Pt reports he was independent with bathing, dressing.  However, pt likely was requiring assist with ADLs, IADLs given pt's current mobility status.        Hand Dominance   Dominant Hand: Right    Extremity/Trunk Assessment   Upper Extremity Assessment Upper Extremity Assessment:  (BUE strength grossly 4-/5)    Lower Extremity Assessment Lower Extremity Assessment: LLE deficits/detail;RLE deficits/detail RLE Deficits / Details: Strength grossly 3-/5 LLE Deficits / Details: Bandage over L knee where son reports pt had a fall recently.  Strength grossly 3-/5    Cervical / Trunk Assessment Cervical / Trunk Assessment: Kyphotic  Communication   Communication: HOH;Other (comment) (pt often mumbles unintelligbly spontaneously)  Cognition Arousal/Alertness: Awake/alert Behavior During Therapy: WFL for tasks assessed/performed Overall Cognitive Status: No family/caregiver present to determine baseline cognitive functioning                                 General Comments: Pt often tangential with his comments and off topic.  He requires redirecting to remain focused on task at hand.  Pt requires increased time to recall where he is and what year it is.      General Comments      Exercises General Exercises - Lower Extremity Straight Leg Raises: AAROM;Both;5 reps;Supine   Assessment/Plan    PT Assessment Patient needs continued PT services  PT Problem List Decreased strength;Decreased activity tolerance;Decreased balance;Decreased mobility;Decreased cognition;Decreased knowledge of use of DME;Decreased safety awareness       PT Treatment Interventions DME instruction;Gait training;Stair training;Functional mobility training;Therapeutic  activities;Therapeutic exercise;Balance training;Neuromuscular re-education;Cognitive remediation;Patient/family education;Wheelchair mobility training    PT Goals (Current goals can be found in the Care Plan section)  Acute Rehab PT Goals Patient Stated Goal: to get stronger PT Goal Formulation: With patient Time For Goal Achievement: 07/31/17 Potential to Achieve Goals: Good    Frequency Min 2X/week   Barriers to discharge   Unsure of amount of assist available from family    Co-evaluation               AM-PAC PT "6 Clicks" Daily Activity  Outcome Measure Difficulty turning over in bed (including adjusting bedclothes, sheets and blankets)?: Total Difficulty moving from lying on back to sitting on the side of the bed? : Total Difficulty sitting down on and standing up from a chair with arms (e.g., wheelchair, bedside commode, etc,.)?: Total Help needed moving to and from a bed to chair (including a wheelchair)?: A Lot Help needed walking in hospital room?: A Lot Help needed climbing 3-5 steps with a railing? : Total 6 Click Score: 8    End of Session Equipment Utilized During Treatment: Gait belt Activity Tolerance: Patient tolerated treatment well;Patient limited by fatigue Patient left: in chair;with call bell/phone within reach;with chair  alarm set Nurse Communication: Mobility status PT Visit Diagnosis: Muscle weakness (generalized) (M62.81);History of falling (Z91.81);Unsteadiness on feet (R26.81)    Time: 2637-8588 PT Time Calculation (min) (ACUTE ONLY): 25 min   Charges:   PT Evaluation $PT Eval Low Complexity: 1 Procedure PT Treatments $Therapeutic Activity: 8-22 mins   PT G Codes:        Collie Siad PT, DPT 07/17/2017, 4:16 PM

## 2017-07-18 DIAGNOSIS — E860 Dehydration: Secondary | ICD-10-CM | POA: Diagnosis not present

## 2017-07-18 DIAGNOSIS — N179 Acute kidney failure, unspecified: Secondary | ICD-10-CM | POA: Diagnosis present

## 2017-07-18 LAB — URINE CULTURE: Culture: NO GROWTH

## 2017-07-18 MED ORDER — ADULT MULTIVITAMIN W/MINERALS CH
1.0000 | ORAL_TABLET | Freq: Every day | ORAL | Status: DC
  Administered 2017-07-19 – 2017-07-20 (×2): 1 via ORAL
  Filled 2017-07-18 (×2): qty 1

## 2017-07-18 MED ORDER — LORAZEPAM 2 MG/ML IJ SOLN
2.0000 mg | Freq: Once | INTRAMUSCULAR | Status: AC
  Administered 2017-07-18: 2 mg via INTRAVENOUS
  Filled 2017-07-18: qty 1

## 2017-07-18 MED ORDER — ENSURE ENLIVE PO LIQD
237.0000 mL | Freq: Three times a day (TID) | ORAL | Status: DC
  Administered 2017-07-18 – 2017-07-20 (×6): 237 mL via ORAL

## 2017-07-18 NOTE — Clinical Social Work Note (Signed)
CSW received a returned phone call from the patient's son. Richardson Landry confirmed that he would like his father to go to SNF as recommended by PT with possibility of ALF placement after. The CSW explained the process and the difference between the two facilities. However, after the phone call, the CSW noticed that the patient was changed to observation v. Inpatient. CSW attempted to recontact the patient's son to discuss how that would affect insurance. CSW was unable to reach the patient's son. CSW will continue to attempt.  Santiago Bumpers, MSW, Latanya Presser [phone]

## 2017-07-18 NOTE — Care Management CC44 (Signed)
Condition Code 44 Documentation Completed  Patient Details  Name: Kyle Whitaker MRN: [id number] Date of Birth: 1930-05-21   Condition Code 44 given:    Patient signature on Condition Code 44 notice:    Documentation of 2 MD's agreement: yes   Code 44 added to claim: yes      Ival Bible, RN 07/18/2017, 12:53 PM

## 2017-07-18 NOTE — Clinical Social Work Note (Signed)
Clinical Social Work Assessment  Patient Details  Name: Kyle Whitaker MRN: [id number] Date of Birth: 1930/11/25  Date of referral:  07/18/17               Reason for consult:  Facility Placement                Permission sought to share information with:  Facility Art therapist granted to share information::  Yes, Verbal Permission Granted  Name::        Agency::     Relationship::     Contact Information:     Housing/Transportation Living arrangements for the past 2 months:  Single Family Home Source of Information:  Patient, Medical Team Patient Interpreter Needed:  None Criminal Activity/Legal Involvement Pertinent to Current Situation/Hospitalization:  No - Comment as needed Significant Relationships:  Adult Children Lives with:  Adult Children Do you feel safe going back to the place where you live?  Yes Need for family participation in patient care:  Yes (Comment) (Patient has dementia)  Care giving concerns:  PT recommendation for SNF   Social Worker assessment / plan:  CSW met with the patient at bedside to discuss PT recommendation. The patient was only oriented to self, but alert. The patient attempted to answer questions; however, he verbalization was non-sensical with a cadence that suggested the patient had meaning he was trying to communicate. Due to the patient's mentation, the CSW attempted to contact the patient's son without success. The CSW left a HIPPA compliant voice message.  According to the chart, the patient has dementia and depression at baseline, but for the past 7 days, his s/s have worsened, and he has not eaten.The dietician has recommended dysphagia 3 diet with thin liquids. The CSW has completed the referral with SNF based on documentation by the attending MD that the patient's son is in agreement with such. The CSW will follow-up with bed offers as available and assist with discharge facilitation.   Employment status:   Retired Forensic scientist:  [employer] PT Recommendations:  [place] / Referral to community resources:  [place]  Patient/Family's Response to care:  The patient was confused but pleasant.  Patient/Family's Understanding of and Emotional Response to Diagnosis, Current Treatment, and Prognosis:  The patient does not seem able to understand the current treatment plan.  Emotional Assessment Appearance:  Appears stated age Attitude/Demeanor/Rapport:   (Pleasantly confused) Affect (typically observed):  Blunt, Pleasant Orientation:  Oriented to Self Alcohol / Substance use:  Never Used Psych involvement (Current and /or in the community):  No (Comment)  Discharge Needs  Concerns to be addressed:  Care Coordination, Discharge Planning Concerns Readmission within the last 30 days:  No Current discharge risk:  Cognitively Impaired, Chronically ill Barriers to Discharge:  Continued Medical Work up   [employer], LCSW 07/18/2017, 4:45 PM

## 2017-07-18 NOTE — Progress Notes (Signed)
Notified Dr Vianne Bulls of pt agitation and attempting to get out of bed. Orders received

## 2017-07-18 NOTE — Progress Notes (Addendum)
Initial Nutrition Assessment  DOCUMENTATION CODES:   Not applicable  INTERVENTION:  Recommend downgrading to dysphagia 3 diet with thin liquids in setting of difficulty chewing.  Provide Ensure Enlive po TID, each supplement provides 350 kcal and 20 grams of protein.   Provide multivitamin with minerals daily. Continue daily folic acid and thiamine.  NUTRITION DIAGNOSIS:   Inadequate oral intake related to poor appetite as evidenced by per patient/family report.  GOAL:   Patient will meet greater than or equal to 90% of their needs  MONITOR:   PO intake, Supplement acceptance, Labs, Weight trends, I & O's  REASON FOR ASSESSMENT:   Malnutrition Screening Tool, Consult Assessment of nutrition requirement/status  ASSESSMENT:   83 year old male with PMHx of HTN, anxiety, arthritis, CHF, HLD, CAD, hx of prostate cancer, dementia who presented with weakness and poor oral intake found to have acute renal failure due to dehydration, upper abdominal mass.   Addendum: Patient's son returned phone call. He reports patient's appetite has been poor for only a few days to one week PTA. He has not been eating as much this week. He typically has a good appetite and intake. Patient has difficulty chewing. Son reports patient has just one plate of dentures, but he cannot remember which plate he is missing. Son reports he has lost approximately 5-10 lbs, but does not know time frame of weight loss.  Patient unable to provide history at time of assessment as he was sleeping after being given dose of Ativan (pt was agitated and attempting to get out of bed earlier this AM). Could not wake patient by name call. He also did not wake during physical exam. Breakfast tray on table with only bites/sips completed. Attempted to call Latham Kinzler (patient's son) to discuss patient's nutrition status and weight history, but he did not answer the phone. Patient's home medications significant for folic acid 1  mg daily, magnesium oxide 500 mg QHS, Remeron 30 mg QHS, thiamine 200 mg daily, vitamin D 50000 units once per week, zinc gluconate 50 mg QHS. Thiamine and folate may be prescribed due to hx of EtOH abuse noted in charts from [place].  Per chart patient was 207.7 lbs on 07/17/2016. RD obtained bed scale weight of 182.3 lbs. He has lost 25.4 lbs (12.2% body weight) over the past year, which is not significant for time frame.  Per HPI patient had presented with one week of poor oral intake. At that time had denies N/V, abdominal pain, diarrhea.  Medications reviewed and include: folic acid 1 mg daily, magnesium oxide 400 mg daily, Remeron 30 mg QHS, thiamine 200 mg daily, NS @ 50 ml/hr.  Labs reviewed: Bun 41, Creatinine 1.3. No levels for thiamine or folate.  Nutrition-Focused physical exam completed. Findings are no fat depletion, mild-moderate muscle depletion in temple region, and no edema. Unable to assess patient's eyes, mouth, or gait due to lethargy.  Patient does not meet criteria for malnutrition at this time.  Discussed with RN.  Diet Order:  Diet Heart Room service appropriate? Yes; Fluid consistency: Thin  Skin:  Wound (see comment) (skin tear right knee)  Last BM:  07/18/2017 - type 5  Height:   Ht Readings from Last 1 Encounters:  07/16/17 5\' 9"  (1.753 m)    Weight:   Wt Readings from Last 1 Encounters:  07/18/17 182 lb 4.8 oz (82.7 kg)    Ideal Body Weight:  72.7 kg  BMI:  Body mass index is 26.92 kg/m.  Estimated Nutritional Needs:   Kcal:  1810-2115 (MSJ x 1.2-1.4)  Protein:  83-100 grams (1-1.2 grams/kg)  Fluid:  2 L/day (25 ml/kg)  EDUCATION NEEDS:   Education needs no appropriate at this time  Willey Blade, MS, RD, LDN Pager: [phone] After Hours Pager: [phone]

## 2017-07-18 NOTE — Plan of Care (Signed)
Problem: Education: Goal: Knowledge of [facility] Education information/materials will improve Outcome: Not Progressing Pt confused  Problem: Health Behavior/Discharge Planning: Goal: Ability to manage health-related needs will improve Pt requires assistance; pt confused

## 2017-07-18 NOTE — Care Management CC44 (Signed)
Condition Code 44 Documentation Completed  Patient Details  Name: Kyle Whitaker MRN: [id number] Date of Birth: 06-30-1930   Condition Code 44 given:  Yes Patient signature on Condition Code 44 notice:  Yes Documentation of 2 MD's agreement:  Yes Code 44 added to claim:  Yes    Yanelly Cantrelle A, RN 07/18/2017, 1:51 PM

## 2017-07-18 NOTE — NC FL2 (Signed)
[place] LEVEL OF CARE SCREENING TOOL     IDENTIFICATION  Patient Name: Kyle Whitaker Birthdate: [birth year] Sex: male Admission Date (Current Location): 07/16/2017  [place] and [place] Number:  Engineering geologist and Address:  [facility], [address] [place], [place] [address]      Provider Number: [id number]  Attending Physician Name and Address:  Epifanio Lesches, MD  Relative Name and Phone Number:       Current Level of Care: Hospital Recommended Level of Care: [place] Prior Approval Number:    Date Approved/Denied:   PASRR Number: [id number]  Discharge Plan: SNF    Current Diagnoses: Patient Active Problem List   Diagnosis Date Noted  . Acute renal failure (ARF) ([place]) 07/18/2017  . ARF (acute renal failure) ([place]) 07/16/2017  . Hallucinations 09/19/2016  . Mild dementia 09/19/2016  . Spells of formed visual hallucinations 09/19/2016  . Carotid stenosis 07/17/2016  . Shortness of breath 06/28/2016  . Carotid artery stenosis   . Chronic coronary artery disease   . Primary osteoarthritis of left hip 10/29/2015    Orientation RESPIRATION BLADDER Height & Weight     Self  Normal Incontinent Weight: 182 lb 4.8 oz (82.7 kg) (bed scale) Height:  5\' 9"  (175.3 cm)  BEHAVIORAL SYMPTOMS/MOOD NEUROLOGICAL BOWEL NUTRITION STATUS      Continent Diet (Dysphagia 3, thin liquids)  AMBULATORY STATUS COMMUNICATION OF NEEDS Skin   Extensive Assist Verbally Normal                       Personal Care Assistance Level of Assistance  Bathing, Feeding, Dressing Bathing Assistance: Limited assistance Feeding assistance: Independent Dressing Assistance: Limited assistance     Functional Limitations Info             SPECIAL CARE FACTORS FREQUENCY  PT (By licensed PT)     PT Frequency: Up to 5X per day, 5 days per week              Contractures      Additional Factors Info  Allergies,  Code Status Code Status Info: Full Allergies Info: Vicodin Hydrocodone-acetaminophen, Penicillins           Current Medications (07/18/2017):  This is the current hospital active medication list Current Facility-Administered Medications  Medication Dose Route Frequency Provider Last Rate Last Dose  . 0.9 %  sodium chloride infusion   Intravenous Continuous Epifanio Lesches, MD 50 mL/hr at 07/18/17 0743    . acetaminophen (TYLENOL) tablet 650 mg  650 mg Oral Q6H PRN Epifanio Lesches, MD   650 mg at 07/17/17 1108  . albuterol (PROVENTIL) (2.5 MG/3ML) 0.083% nebulizer solution 2.5 mg  2.5 mg Nebulization Q2H PRN Demetrios Loll, MD      . aspirin EC tablet 81 mg  81 mg Oral Daily Demetrios Loll, MD   81 mg at 07/18/17 7035  . donepezil (ARICEPT) tablet 10 mg  10 mg Oral QHS Demetrios Loll, MD   10 mg at 07/17/17 2206  . feeding supplement (ENSURE ENLIVE) (ENSURE ENLIVE) liquid 237 mL  237 mL Oral TID BM Epifanio Lesches, MD   237 mL at 07/18/17 1419  . folic acid (FOLVITE) tablet 1 mg  1 mg Oral Daily Demetrios Loll, MD   1 mg at 07/18/17 0838  . heparin injection 5,000 Units  5,000 Units Subcutaneous Q8H Demetrios Loll, MD   5,000 Units at 07/18/17 1419  . magnesium oxide (MAG-OX)  tablet 400 mg  400 mg Oral QHS Demetrios Loll, MD   400 mg at 07/17/17 2205  . memantine (NAMENDA) tablet 10 mg  10 mg Oral QHS Demetrios Loll, MD   10 mg at 07/17/17 2205  . mirtazapine (REMERON) tablet 30 mg  30 mg Oral QHS Demetrios Loll, MD   30 mg at 07/17/17 2205  . [START ON 07/19/2017] multivitamin with minerals tablet 1 tablet  1 tablet Oral Daily Epifanio Lesches, MD      . ondansetron [facility]) tablet 4 mg  4 mg Oral Q6H PRN Demetrios Loll, MD       Or  . ondansetron [facility]) injection 4 mg  4 mg Intravenous Q6H PRN Demetrios Loll, MD      . senna-docusate (Senokot-S) tablet 1 tablet  1 tablet Oral QHS PRN Demetrios Loll, MD      . thiamine (VITAMIN B-1) tablet 200 mg  200 mg Oral Daily Demetrios Loll, MD   200 mg at 07/18/17 1419      Discharge Medications: Please see discharge summary for a list of discharge medications.  Relevant Imaging Results:  Relevant Lab Results:   Additional Information SS# [id number]  Zettie Pho, LCSW

## 2017-07-18 NOTE — Care Management Obs Status (Signed)
[place] NOTIFICATION   Patient Details  Name: Kyle Whitaker MRN: [id number] Date of Birth: 06-Dec-1930   Medicare Observation Status Notification Given:  Yes [place] letter)    Mardene Speak, RN 07/18/2017, 1:51 PM

## 2017-07-18 NOTE — Clinical Social Work Placement (Signed)
   CLINICAL SOCIAL WORK PLACEMENT  NOTE  Date:  07/18/2017  Patient Details  Name: Kyle Whitaker MRN: [id number] Date of Birth: [birth year]  Clinical Social Work is seeking post-discharge placement for this patient at the [place] level of care (*CSW will initial, date and re-position this form in  chart as items are completed):  Yes   Patient/family provided with [place] Work Department's list of facilities offering this level of care within the geographic area requested by the patient (or if unable, by the patient's family).  Yes   Patient/family informed of their freedom to choose among providers that offer the needed level of care, that participate in Medicare, Medicaid or managed care program needed by the patient, have an available bed and are willing to accept the patient.  Yes   Patient/family informed of [facility]'s ownership interest in [facility] and [facility], as well as of the fact that they are under no obligation to receive care at these facilities.  PASRR submitted to EDS on       PASRR number received on       Existing PASRR number confirmed on 07/18/17     FL2 transmitted to all facilities in geographic area requested by pt/family on 07/18/17     FL2 transmitted to all facilities within larger geographic area on       Patient informed that his/her managed care company has contracts with or will negotiate with certain facilities, including the following:            Patient/family informed of bed offers received.  Patient chooses bed at       Physician recommends and patient chooses bed at      Patient to be transferred to   on  .  Patient to be transferred to facility by       Patient family notified on   of transfer.  Name of family member notified:        PHYSICIAN       Additional Comment:    _______________________________________________ Zettie Pho, LCSW 07/18/2017, 4:49 PM

## 2017-07-18 NOTE — Progress Notes (Signed)
[place] at [place] NAME: Kyle Whitaker    MR#:  [id number]  DATE OF BIRTH:  [birth year]  SUBJECTIVE: Admitted for acute kidney injury, elevated liver functions. Treatment is demented and he is mumbling.   CHIEF COMPLAINT:   Chief Complaint  Patient presents with  . Failure To Thrive    REVIEW OF SYSTEMS:    Review of Systems  Unable to perform ROS: Dementia    Nutrition:  Tolerating Diet: Tolerating PT:      DRUG ALLERGIES:   Allergies  Allergen Reactions  . Vicodin [Hydrocodone-Acetaminophen] Shortness Of Breath  . Penicillins Itching    Has patient had a PCN reaction causing immediate rash, facial/tongue/throat swelling, SOB or lightheadedness with hypotension: No Has patient had a PCN reaction causing severe rash involving mucus membranes or skin necrosis: No Has patient had a PCN reaction that required hospitalization No Has patient had a PCN reaction occurring within the last 10 years: No If all of the above answers are "NO", then may proceed with Cephalosporin use.     VITALS:  Blood pressure (!) 116/59, pulse 75, temperature 98.6 F (37 C), temperature source Oral, resp. rate 20, height 5\' 9"  (1.753 m), weight 77.1 kg (170 lb), SpO2 100 %.  PHYSICAL EXAMINATION:   Physical Exam  GENERAL:  83 y.o.-year-old patient lying in the bed with no acute distress.  EYES: Pupils equal, round, reactive to light and accommodation. No scleral icterus. Extraocular muscles intact.  HEENT: Head atraumatic, normocephalic. Oropharynx and nasopharynx clear.  NECK:  Supple, no jugular venous distention. No thyroid enlargement, no tenderness.  LUNGS: Normal breath sounds bilaterally, no wheezing, rales,rhonchi or crepitation. No use of accessory muscles of respiration.  CARDIOVASCULAR: S1, S2 normal. No murmurs, rubs, or gallops.  ABDOMEN: Soft, nontender, nondistended. Bowel sounds present. No organomegaly or mass.   EXTREMITIES: No pedal edema, cyanosis, or clubbing.  NEUROLOGIC: Dementia not able to  Follow commands for neuroExam due to that. PSYCHIATRIC: The patient is alert and oriented x 3.  SKIN: No obvious rash, lesion, or ulcer.    LABORATORY PANEL:   CBC  Recent Labs Lab 07/17/17 0415  WBC 11.0*  HGB 11.6*  HCT 34.2*  PLT 198   ------------------------------------------------------------------------------------------------------------------  Chemistries   Recent Labs Lab 07/17/17 1011  NA 143  K 3.8  CL 105  CO2 30  GLUCOSE 95  BUN 40*  CREATININE 1.30*  CALCIUM 8.2*  AST 131*  ALT 55  ALKPHOS 53  BILITOT 1.0   ------------------------------------------------------------------------------------------------------------------  Cardiac Enzymes No results for input(s): TROPONINI in the last 168 hours. ------------------------------------------------------------------------------------------------------------------  RADIOLOGY:  Mr Abdomen Wo Contrast  Result Date: 07/17/2017 CLINICAL DATA:  Abdominal mass on ultrasound exam identified anterior to the pancreatic head. EXAM: MRI ABDOMEN WITHOUT CONTRAST TECHNIQUE: Multiplanar multisequence MR imaging was performed without the administration of intravenous contrast. COMPARISON:  Ultrasound exam 07/16/2017 FINDINGS: Study is significantly degraded by motion artifact. Lower chest:  Unremarkable. Hepatobiliary: No gross abnormality within the liver parenchyma. Assessment limited by motion artifact and lack of intravenous contrast material. Scattered tiny T2 hyperintensities are likely cysts. Probable tiny gallstone. No intrahepatic or extrahepatic biliary dilation. Pancreas: Diffusely atrophic.  No dilatation of the main duct. Spleen: Unremarkable. Adrenals/Urinary Tract: No adrenal nodule or mass. T2 hyperintensities are identified in both kidneys. The largest is a 2.3 cm interpolar left renal lesion. 11 mm T2 hypointense lesion in  the posterior interpolar right kidney has associated T1 shortening and is  likely a cyst complicated by proteinaceous debris or hemorrhage. Stomach/Bowel: Stomach is nondistended. Probable tiny hiatal hernia. 3.6 x 3.1 cm T2 hyperintense region is identified in the region of the hepatoduodenal ligament and descending duodenum, but is not well visualized due to motion artifact. This is probably the abnormality that was seen on ultrasound. Vascular/Lymphatic: No abdominal aortic aneurysm Other: No intraperitoneal free fluid. Musculoskeletal: No abnormal marrow signal within the visualized bony anatomy. IMPRESSION: 1. 3.6 cm T2 hyperintense structure anterior to the pancreatic head and in the region of the descending duodenum, compatible with the finding on recent ultrasound exam. 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 Chest CT from 06/28/2016 suggests the presence of a diverticulum in this region but did not image all the way through the area. Follow-up CT of the abdomen with oral contrast could be used to confirm that this lesion represents a diverticulum. 2. Bilateral renal cysts. Probable complex cyst posterior right kidney cannot be definitively characterized on today's noncontrast exam. This could also be assessed again at the time of followup CT. 3. Tiny hepatic cysts. Electronically Signed   By: Misty Stanley M.D.   On: 07/17/2017 18:23   US Abdomen Limited Ruq  Result Date: 07/16/2017 CLINICAL DATA:  Elevated LFTs. EXAM: ULTRASOUND ABDOMEN LIMITED RIGHT UPPER QUADRANT COMPARISON:  None. FINDINGS: Gallbladder: No gallstones or wall thickening visualized. No sonographic Murphy sign noted by sonographer. Common bile duct: Diameter: 2.8 mm Liver: No focal lesion identified. Within normal limits in parenchymal echogenicity. Other: Indeterminate, solid appearing hypoechoic structure is identified within the upper abdomen measuring  3.6 x 1.8 x 2.9 cm. IMPRESSION: 1. Normal appearance of the liver and gallbladder. 2. There is an indeterminate, solid-appearing and hypoechoic structure within the upper abdomen measuring 3.6 cm. Cannot rule out mass or adenopathy. Recommend further investigation with contrast enhanced CT or MRI of the upper abdomen. These results will be called to the ordering clinician or representative by the Radiologist Assistant, and communication documented in the PACS or zVision Dashboard. Electronically Signed   By: Kerby Moors M.D.   On: 07/16/2017 12:35     ASSESSMENT AND PLAN:   Active Problems:   ARF (acute renal failure) (HCC)   : Acute renal failure due to dehydration: Improving with IV fluids. Avoid for toxic agents. . Hold HCTZ, lisinopril. Poor po intake.continue maintenance fluids, #2 upper abdominal mass: Indeterminate.MRI abdomen showed duodenal diverticula.   #3 CAD: Continue aspirin, Plavix. Dementia, poor by mouth intake: Dietitian consulted.  5 leukocytosis: Likely reactive: Improving.  Dispo;PT recommends SNF.  All the records are reviewed and case discussed with Care Management/Social Workerr. Management plans discussed with the patient, family and they are in agreement.  CODE STATUS:full  TOTAL TIME TAKING CARE OF THIS PATIENT:35 minutes.   POSSIBLE D/C IN 1-2 DAYS, DEPENDING ON CLINICAL CONDITION.   Epifanio Lesches M.D on 07/18/2017 at 10:00 AM  Between 7am to 6pm - Pager - [phone]  After 6pm go to [email or web address] - password [email or web address] [facility]  [facility]  Office  [phone]  CC: Primary care physician; [place] Pink, MD

## 2017-07-19 DIAGNOSIS — E860 Dehydration: Secondary | ICD-10-CM | POA: Diagnosis not present

## 2017-07-19 LAB — OCCULT BLOOD X 1 CARD TO LAB, STOOL: Fecal Occult Bld: NEGATIVE

## 2017-07-19 MED ORDER — TRAZODONE HCL 50 MG PO TABS
50.0000 mg | ORAL_TABLET | Freq: Once | ORAL | Status: AC
  Administered 2017-07-19: 50 mg via ORAL
  Filled 2017-07-19: qty 1

## 2017-07-19 MED ORDER — ENOXAPARIN SODIUM 40 MG/0.4ML ~~LOC~~ SOLN
40.0000 mg | SUBCUTANEOUS | Status: DC
  Administered 2017-07-19: 40 mg via SUBCUTANEOUS
  Filled 2017-07-19: qty 0.4

## 2017-07-19 NOTE — Clinical Social Work Note (Signed)
CSW spoke with pt's son regarding private paying or returning home versus going to SNF under his Medicaid benefit. CSW explained process of SNF placement under Medicaid. Pt's son was in agreement with plan. CSW gave bed offers, pt's chose [facility]. CSW updated RNCM. CSW also confirmed with facility. CSW will continue to follow.   Darden Dates, MSW, [place] Social Worker  [phone]

## 2017-07-19 NOTE — Care Management (Signed)
Patient admitted with ARF.  Patient lives at home with his son and daughter in law.  PCP [facility].  Pharmacy [facility].  Patient has WC, walker, and cane in the home.  PT has assessed patient and currently recommending SNF, however patient is currently Medicare observation.  CSW is inquiring about placement in a Medicaid bed.  Patient has previously been open with [facility].  If patient returns home son would like patient to have home health services again with [facility].  RNCM following.

## 2017-07-19 NOTE — Progress Notes (Signed)
[place] at [place] NAME: Kyle Whitaker    MR#:  [id number]  DATE OF BIRTH:  07-Aug-1930  SUBJECTIVE: Patient admitted for acute kidney injury, received IV fluids. He is awake but demented. Nurse mentioned that he is total assist /  CHIEF COMPLAINT:   Chief Complaint  Patient presents with  . Failure To Thrive    REVIEW OF SYSTEMS:    Review of Systems  Unable to perform ROS: Dementia    Nutrition:  Tolerating Diet: Tolerating PT:      DRUG ALLERGIES:   Allergies  Allergen Reactions  . Vicodin [Hydrocodone-Acetaminophen] Shortness Of Breath  . Penicillins Itching    Has patient had a PCN reaction causing immediate rash, facial/tongue/throat swelling, SOB or lightheadedness with hypotension: No Has patient had a PCN reaction causing severe rash involving mucus membranes or skin necrosis: No Has patient had a PCN reaction that required hospitalization No Has patient had a PCN reaction occurring within the last 10 years: No If all of the above answers are "NO", then may proceed with Cephalosporin use.     VITALS:  Blood pressure (!) 169/72, pulse 97, temperature 98.3 F (36.8 C), temperature source Oral, resp. rate 20, height 5\' 9"  (1.753 m), weight 82.7 kg (182 lb 4.8 oz), SpO2 98 %.  PHYSICAL EXAMINATION:   Physical Exam  GENERAL:  83 y.o.-year-old patient lying in the bed with no acute distress.  EYES: Pupils equal, round, reactive to light and accommodation. No scleral icterus. Extraocular muscles intact.  HEENT: Head atraumatic, normocephalic. Oropharynx and nasopharynx clear.  NECK:  Supple, no jugular venous distention. No thyroid enlargement, no tenderness.  LUNGS: Normal breath sounds bilaterally, no wheezing, rales,rhonchi or crepitation. No use of accessory muscles of respiration.  CARDIOVASCULAR: S1, S2 normal. No murmurs, rubs, or gallops.  ABDOMEN: Soft, nontender, nondistended. Bowel sounds present. No  organomegaly or mass.  EXTREMITIES: No pedal edema, cyanosis, or clubbing.  NEUROLOGIC: Dementia not able to  Follow commands for neuroExam due to that. PSYCHIATRIC: The patient is alert and oriented x 3.  SKIN: No obvious rash, lesion, or ulcer.    LABORATORY PANEL:   CBC  Recent Labs Lab 07/17/17 0415  WBC 11.0*  HGB 11.6*  HCT 34.2*  PLT 198   ------------------------------------------------------------------------------------------------------------------  Chemistries   Recent Labs Lab 07/17/17 1011  NA 143  K 3.8  CL 105  CO2 30  GLUCOSE 95  BUN 40*  CREATININE 1.30*  CALCIUM 8.2*  AST 131*  ALT 55  ALKPHOS 53  BILITOT 1.0   ------------------------------------------------------------------------------------------------------------------  Cardiac Enzymes No results for input(s): TROPONINI in the last 168 hours. ------------------------------------------------------------------------------------------------------------------  RADIOLOGY:  Mr Abdomen Wo Contrast  Result Date: 07/17/2017 CLINICAL DATA:  Abdominal mass on ultrasound exam identified anterior to the pancreatic head. EXAM: MRI ABDOMEN WITHOUT CONTRAST TECHNIQUE: Multiplanar multisequence MR imaging was performed without the administration of intravenous contrast. COMPARISON:  Ultrasound exam 07/16/2017 FINDINGS: Study is significantly degraded by motion artifact. Lower chest:  Unremarkable. Hepatobiliary: No gross abnormality within the liver parenchyma. Assessment limited by motion artifact and lack of intravenous contrast material. Scattered tiny T2 hyperintensities are likely cysts. Probable tiny gallstone. No intrahepatic or extrahepatic biliary dilation. Pancreas: Diffusely atrophic.  No dilatation of the main duct. Spleen: Unremarkable. Adrenals/Urinary Tract: No adrenal nodule or mass. T2 hyperintensities are identified in both kidneys. The largest is a 2.3 cm interpolar left renal lesion. 11 mm T2  hypointense lesion in the posterior interpolar  right kidney has associated T1 shortening and is likely a cyst complicated by proteinaceous debris or hemorrhage. Stomach/Bowel: Stomach is nondistended. Probable tiny hiatal hernia. 3.6 x 3.1 cm T2 hyperintense region is identified in the region of the hepatoduodenal ligament and descending duodenum, but is not well visualized due to motion artifact. This is probably the abnormality that was seen on ultrasound. Vascular/Lymphatic: No abdominal aortic aneurysm Other: No intraperitoneal free fluid. Musculoskeletal: No abnormal marrow signal within the visualized bony anatomy. IMPRESSION: 1. 3.6 cm T2 hyperintense structure anterior to the pancreatic head and in the region of the descending duodenum, compatible with the finding on recent ultrasound exam. 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 Chest CT from 06/28/2016 suggests the presence of a diverticulum in this region but did not image all the way through the area. Follow-up CT of the abdomen with oral contrast could be used to confirm that this lesion represents a diverticulum. 2. Bilateral renal cysts. Probable complex cyst posterior right kidney cannot be definitively characterized on today's noncontrast exam. This could also be assessed again at the time of followup CT. 3. Tiny hepatic cysts. Electronically Signed   By: Misty Stanley M.D.   On: 07/17/2017 18:23     ASSESSMENT AND PLAN:   Active Problems:   ARF (acute renal failure) (HCC)   Acute renal failure (ARF) (HCC)   : Acute renal failure due to dehydration: Improving with IV fluids. . .  , #2 upper abdominal mass: Indeterminate.MRI abdomen showed duodenal diverticula. No further workup needed.  #3 CAD: Continue aspirin, Plavix. Dementia, poor by mouth intake: Continue dysphagia 3 diet with thin liquids, ensure.  5 leukocytosis: Likely reactive:  Improving.    Dispo;PT recommends SNF. Patient is not safe for discharge home.  All the records are reviewed and case discussed with Care Management/Social Workerr. Management plans discussed with the patient, family and they are in agreement.  CODE STATUS:full  TOTAL TIME TAKING CARE OF THIS PATIENT:35 minutes.   POSSIBLE D/C IN 1-2 DAYS, DEPENDING ON CLINICAL CONDITION.   Epifanio Lesches M.D on 07/19/2017 at 1:22 PM  Between 7am to 6pm - Pager - [phone]  After 6pm go to [email or web address] - password [email or web address] [facility]  [place]  Office  [phone]  CC: Primary care physician; [place] Pink, MD

## 2017-07-20 DIAGNOSIS — E860 Dehydration: Secondary | ICD-10-CM | POA: Diagnosis not present

## 2017-07-20 MED ORDER — DIAZEPAM 2 MG PO TABS: 2.0000 mg | ORAL_TABLET | Freq: Two times a day (BID) | ORAL | 0 refills | Status: DC

## 2017-07-20 MED ORDER — ENSURE ENLIVE PO LIQD: 237.0000 mL | Freq: Three times a day (TID) | ORAL | 12 refills | Status: DC

## 2017-07-20 MED ORDER — THIAMINE HCL 100 MG PO TABS: 100.0000 mg | ORAL_TABLET | Freq: Every day | ORAL | 0 refills | Status: DC

## 2017-07-20 MED ORDER — ADULT MULTIVITAMIN W/MINERALS CH: 1.0000 | ORAL_TABLET | Freq: Every day | ORAL | 0 refills | Status: DC

## 2017-07-20 MED ORDER — HYDRALAZINE HCL 20 MG/ML IJ SOLN
10.0000 mg | INTRAMUSCULAR | Status: DC | PRN
  Administered 2017-07-20: 10 mg via INTRAVENOUS
  Filled 2017-07-20: qty 1

## 2017-07-20 NOTE — Progress Notes (Signed)
Notified dr.pyreddy of pt slow inc of b/p. Acknowledged and new orders placed.

## 2017-07-20 NOTE — Discharge Summary (Signed)
Kyle Whitaker, is a 83 y.o. male  DOB [birth year]  MRN [id number].  Admission date:  07/16/2017  Admitting Physician  Demetrios Loll, MD  Discharge Date:  07/20/2017   Primary MD  Maryland Pink, MD  Recommendations for primary care physician for things to follow:   Follow-up with PCP in one week.   Admission Diagnosis  Dehydration [E86.0] Elevated LFTs [R79.89] Failure to thrive in adult [R62.7] Epigastric mass [R19.06] Acute kidney injury ([place]) [N17.9]   Discharge Diagnosis  Dehydration [E86.0] Elevated LFTs [R79.89] Failure to thrive in adult [R62.7] Epigastric mass [R19.06] Acute kidney injury ([place]) [N17.9]    Active Problems:   ARF (acute renal failure) (HCC)   Acute renal failure (ARF) (HCC)      Past Medical History:  Diagnosis Date  . Anxiety   . Arthritis   . Cancer [facility]) 2005   Prostate  . CHF (congestive heart failure) ([place])   . Coronary artery disease   . Hyperlipidemia   . Hypertension   . Peripheral vascular disease ([place])    carotid stenosis  . Shortness of breath dyspnea    on exertion    Past Surgical History:  Procedure Laterality Date  . CATARACT EXTRACTION W/PHACO Right 12/10/2016   Procedure: CATARACT EXTRACTION PHACO AND INTRAOCULAR LENS PLACEMENT (IOC);  Surgeon: Eulogio Bear, MD;  Location: [facility] ORS;  Service: Ophthalmology;  Laterality: Right;  Lot #[id number] H US:01:31.2 AP%:18.4 CDE:17.30  . CATARACT EXTRACTION W/PHACO Left 01/07/2017   Procedure: CATARACT EXTRACTION PHACO AND INTRAOCULAR LENS PLACEMENT (IOC);  Surgeon: Eulogio Bear, MD;  Location: [facility] ORS;  Service: Ophthalmology;  Laterality: Left;  Lot# [id number] H [place]: 01:05.8 AP%: 16.3 CDE: 10.72  . ENDARTERECTOMY Right 07/17/2016   Procedure: ENDARTERECTOMY CAROTID;  Surgeon: Katha Cabal, MD;  Location: [facility] ORS;   Service: Vascular;  Laterality: Right;  . JOINT REPLACEMENT Right 2011   Total Hip Replacement, [facility]  . JOINT REPLACEMENT Right 2011   Total Knee Replacement, [facility]  . JOINT REPLACEMENT Left 2015   total hip replacement  . KNEE ARTHROSCOPY Right 2011  . PROSTATE SURGERY    . TOTAL HIP ARTHROPLASTY Left 10/29/2015   Procedure: TOTAL HIP ARTHROPLASTY ANTERIOR APPROACH;  Surgeon: Hessie Knows, MD;  Location: [facility] ORS;  Service: Orthopedics;  Laterality: Left;       History of present illness and  Hospital Course:     Kindly see H&P for history of present illness and admission details, please review complete Labs, Consult reports and Test reports for all details in brief  HPI  from the history and physical done on the day of admission 83 year old male with dementia, prostate cancer, hypertension, coronary artery disease, PVD comes in because of generalized weakness, poor appetite, confusion. Patient has dementia and unable to give history about abdominal pain, diarrhea, melena. Found to have renal failure and admitted for the same.   Hospital Course   #1 acute renal failure due to dehydration, poor by mouth intake. Admitted to hospitalist service, started on IV fluids, Discontinued his hydrochlorothiazide, lisinopril that he takes at home. Patient's creatinine improved from 2.10-1.30.  #2 .essential hypertension: Ordered  to get her IV hydralazine for elevated blood pressure but patient blood pressure stayed around 120/70.Marland Kitchenkidney function improved patient can go back on hydrochlorothiazide, lisinopril.  #3. dementia with agitation episodes: Patient will be going to [facility] today. Does have poor dementia and mumbling episodes and agitation at times. #4 deconditioning and difficulty with ambulation due to dementia. Therapy recommended  skilled nursing.; Continue Risperdal, Aricept, Valium, Remeron.   #5. Elevated LFTs including AST, ALT only. The patient AST 177, ALT 60 and  admission, total bilirubin 1.8 on admissionImproved to AST 131, ALT 55 and total bilirubin normalized to 1.  Upper abdominal mass: patient had MRI of the abdomen  showing. On diverticula and without any abnormalities.  So  no further -up workup is done.  #6 leukocytosis secondary to stress. Improved white count 16.6 on admission and it improved to 11.  #7: History of CAD: Continue aspirin, Plavix.  Discharge Condition:    Follow UP  Follow-up Information    Maryland Pink, MD Follow up in 1 week(s).   Specialty:  Family Medicine Contact information: [address] [place] [address] [phone]             Discharge Instructions  and  Discharge Medications     Allergies as of 07/20/2017      Reactions   Vicodin [hydrocodone-acetaminophen] Shortness Of Breath   Penicillins Itching   Has patient had a PCN reaction causing immediate rash, facial/tongue/throat swelling, SOB or lightheadedness with hypotension: No Has patient had a PCN reaction causing severe rash involving mucus membranes or skin necrosis: No Has patient had a PCN reaction that required hospitalization No Has patient had a PCN reaction occurring within the last 10 years: No If all of the above answers are "NO", then may proceed with Cephalosporin use.      Medication List    STOP taking these medications   clopidogrel 75 MG tablet Commonly known as:  PLAVIX     TAKE these medications   amLODipine 5 MG tablet Commonly known as:  NORVASC Take 1 tablet (5 mg total) by mouth daily.   aspirin 81 MG EC tablet Take 1 tablet (81 mg total) by mouth daily.   atenolol 25 MG tablet Commonly known as:  TENORMIN Take 25 mg by mouth daily.   diazepam 2 MG tablet Commonly known as:  VALIUM Take 1 tablet (2 mg total) by mouth 2 (two) times daily.   donepezil 10 MG tablet Commonly known as:  ARICEPT Take 10 mg by mouth at bedtime.   feeding supplement (ENSURE ENLIVE) Liqd Take  237 mLs by mouth 3 (three) times daily between meals.   folic acid 1 MG tablet Commonly known as:  FOLVITE Take 1 mg by mouth daily.   hydrochlorothiazide 25 MG tablet Commonly known as:  HYDRODIURIL Take 25 mg by mouth daily.   lisinopril 20 MG tablet Commonly known as:  PRINIVIL,ZESTRIL Take 20 mg by mouth daily.   Magnesium Oxide 500 MG Tabs Take 500 mg by mouth at bedtime.   memantine 10 MG tablet Commonly known as:  NAMENDA Take 10 mg by mouth at bedtime.   mirtazapine 30 MG tablet Commonly known as:  REMERON Take 30 mg by mouth at bedtime.   multivitamin with minerals Tabs tablet Take 1 tablet by mouth daily.   risperiDONE 0.5 MG disintegrating tablet Commonly known as:  RISPERDAL M-TABS Take 1 tablet (0.5 mg total) by mouth at bedtime.   thiamine 100 MG tablet Take 1 tablet (100 mg total) by mouth daily. What changed:  how much to take   Vitamin D (Ergocalciferol) 50000 units Caps capsule Commonly known as:  DRISDOL Take 50,000 Units by mouth once a week. Fridays   zinc gluconate 50 MG tablet Take 50 mg by mouth at bedtime.  Diet and Activity recommendation: See Discharge Instructions above   Consults obtained - PT   Major procedures and Radiology Reports - PLEASE review detailed and final reports for all details, in brief -     Mr Abdomen Wo Contrast  Result Date: 07/17/2017 CLINICAL DATA:  Abdominal mass on ultrasound exam identified anterior to the pancreatic head. EXAM: MRI ABDOMEN WITHOUT CONTRAST TECHNIQUE: Multiplanar multisequence MR imaging was performed without the administration of intravenous contrast. COMPARISON:  Ultrasound exam 07/16/2017 FINDINGS: Study is significantly degraded by motion artifact. Lower chest:  Unremarkable. Hepatobiliary: No gross abnormality within the liver parenchyma. Assessment limited by motion artifact and lack of intravenous contrast material. Scattered tiny T2 hyperintensities are likely cysts.  Probable tiny gallstone. No intrahepatic or extrahepatic biliary dilation. Pancreas: Diffusely atrophic.  No dilatation of the main duct. Spleen: Unremarkable. Adrenals/Urinary Tract: No adrenal nodule or mass. T2 hyperintensities are identified in both kidneys. The largest is a 2.3 cm interpolar left renal lesion. 11 mm T2 hypointense lesion in the posterior interpolar right kidney has associated T1 shortening and is likely a cyst complicated by proteinaceous debris or hemorrhage. Stomach/Bowel: Stomach is nondistended. Probable tiny hiatal hernia. 3.6 x 3.1 cm T2 hyperintense region is identified in the region of the hepatoduodenal ligament and descending duodenum, but is not well visualized due to motion artifact. This is probably the abnormality that was seen on ultrasound. Vascular/Lymphatic: No abdominal aortic aneurysm Other: No intraperitoneal free fluid. Musculoskeletal: No abnormal marrow signal within the visualized bony anatomy. IMPRESSION: 1. 3.6 cm T2 hyperintense structure anterior to the pancreatic head and in the region of the descending duodenum, compatible with the finding on recent ultrasound exam. 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 Chest CT from 06/28/2016 suggests the presence of a diverticulum in this region but did not image all the way through the area. Follow-up CT of the abdomen with oral contrast could be used to confirm that this lesion represents a diverticulum. 2. Bilateral renal cysts. Probable complex cyst posterior right kidney cannot be definitively characterized on today's noncontrast exam. This could also be assessed again at the time of followup CT. 3. Tiny hepatic cysts. Electronically Signed   By: Misty Stanley M.D.   On: 07/17/2017 18:23   Dg Chest Portable 1 View  Result Date: 07/16/2017 CLINICAL DATA:  Worsening weakness.  Decreased appetite for 1 week. EXAM: PORTABLE CHEST 1 VIEW  COMPARISON:  None. FINDINGS: There is no focal parenchymal opacity. There is no pleural effusion or pneumothorax. The heart and mediastinal contours are unremarkable. There is mild osteoarthritis of the right glenohumeral joint. There is old posttraumatic deformity of the right distal clavicle. IMPRESSION: No active cardiopulmonary disease. Electronically Signed   By: Kathreen Devoid   On: 07/16/2017 09:32   US Abdomen Limited Ruq  Result Date: 07/16/2017 CLINICAL DATA:  Elevated LFTs. EXAM: ULTRASOUND ABDOMEN LIMITED RIGHT UPPER QUADRANT COMPARISON:  None. FINDINGS: Gallbladder: No gallstones or wall thickening visualized. No sonographic Murphy sign noted by sonographer. Common bile duct: Diameter: 2.8 mm Liver: No focal lesion identified. Within normal limits in parenchymal echogenicity. Other: Indeterminate, solid appearing hypoechoic structure is identified within the upper abdomen measuring 3.6 x 1.8 x 2.9 cm. IMPRESSION: 1. Normal appearance of the liver and gallbladder. 2. There is an indeterminate, solid-appearing and hypoechoic structure within the upper abdomen measuring 3.6 cm. Cannot rule out mass or adenopathy. Recommend further investigation with contrast enhanced CT or  MRI of the upper abdomen. These results will be called to the ordering clinician or representative by the Radiologist Assistant, and communication documented in the PACS or zVision Dashboard. Electronically Signed   By: Kerby Moors M.D.   On: 07/16/2017 12:35    Micro Results    Recent Results (from the past 240 hour(s))  Urine Culture     Status: None   Collection Time: 07/16/17  9:29 AM  Result Value Ref Range Status   Specimen Description URINE, RANDOM  Final   Special Requests NONE  Final   Culture   Final    NO GROWTH Performed at [facility], [address] [place], [place] [address]    Report Status 07/18/2017 FINAL  Final       Today   Subjective:   Benay Pike today Stable for  discharge  Objective:   Blood pressure (!) 135/94, pulse 89, temperature 98.1 F (36.7 C), temperature source Oral, resp. rate 18, height 5\' 9"  (1.753 m), weight 82.7 kg (182 lb 4.8 oz), SpO2 100 %.   Intake/Output Summary (Last 24 hours) at 07/20/17 0927 Last data filed at 07/20/17 0500  Gross per 24 hour  Intake             1232 ml  Output                0 ml  Net             1232 ml    Exam Awake Alert, Oriented x 3, No new F.N deficits, Normal affect [place].AT,PERRAL Supple Neck,No JVD, No cervical lymphadenopathy appriciated.  Symmetrical Chest wall movement, Good air movement bilaterally, CTAB RRR,No Gallops,Rubs or new Murmurs, No Parasternal Heave +ve B.Sounds, Abd Soft, Non tender, No organomegaly appriciated, No rebound -guarding or rigidity. No Cyanosis, Clubbing or edema, No new Rash or bruise  Data Review   CBC w Diff: Lab Results  Component Value Date   WBC 11.0 (H) 07/17/2017   HGB 11.6 (L) 07/17/2017   HGB 13.1 11/24/2013   HCT 34.2 (L) 07/17/2017   HCT 39.5 (L) 11/24/2013   PLT 198 07/17/2017   PLT 287 11/24/2013   LYMPHOPCT 21 07/10/2016   MONOPCT 9 07/10/2016   EOSPCT 0 07/10/2016   BASOPCT 0 07/10/2016    CMP: Lab Results  Component Value Date   NA 143 07/17/2017   NA 137 11/24/2013   K 3.8 07/17/2017   K 3.5 11/24/2013   CL 105 07/17/2017   CL 104 11/24/2013   CO2 30 07/17/2017   CO2 32 11/24/2013   BUN 40 (H) 07/17/2017   BUN 16 11/24/2013   CREATININE 1.30 (H) 07/17/2017   CREATININE 1.17 11/24/2013   PROT 5.5 (L) 07/17/2017   ALBUMIN 2.7 (L) 07/17/2017   BILITOT 1.0 07/17/2017   ALKPHOS 53 07/17/2017   AST 131 (H) 07/17/2017   ALT 55 07/17/2017  .   Total Time in preparing paper work, data evaluation and todays exam - 31 minutes  Ely Spragg M.D on 07/20/2017 at 9:27 AM    Note: This dictation was prepared with Dragon dictation along with smaller phrase technology. Any transcriptional errors that result from this process  are unintentional.

## 2017-07-20 NOTE — Progress Notes (Signed)
Pt prepared for d/c to [facility]. IV d/c'd. Skin intact except as charted in most recent assessments. Vitals are stable. Report called to receiving facility. Pt to be transported by ambulance service. Son called and updated on pt.'s discharged and is aware of pt going to [facility].   Lyniah Fujita [employer]

## 2017-07-20 NOTE — Clinical Social Work Note (Signed)
Patient is to discharge today to [facility]. Discharge information sent to [facility]. Nurse to call son and notify of discharge. Shela Leff MSW,LCSW [phone]

## 2017-07-20 NOTE — Care Management (Signed)
Patient to discharge to SNF.  RNCM signing off.  Please re consult if indicated.

## 2017-07-20 NOTE — Clinical Social Work Placement (Signed)
   CLINICAL SOCIAL WORK PLACEMENT  NOTE  Date:  07/20/2017  Patient Details  Name: Kyle Whitaker MRN: [id number] Date of Birth: 05-Apr-1930  Clinical Social Work is seeking post-discharge placement for this patient at the [place] level of care (*CSW will initial, date and re-position this form in  chart as items are completed):  Yes   Patient/family provided with [facility] Work Department's list of facilities offering this level of care within the geographic area requested by the patient (or if unable, by the patient's family).  Yes   Patient/family informed of their freedom to choose among providers that offer the needed level of care, that participate in Medicare, Medicaid or managed care program needed by the patient, have an available bed and are willing to accept the patient.  Yes   Patient/family informed of [facility]'s ownership interest in [facility] and [facility], as well as of the fact that they are under no obligation to receive care at these facilities.  PASRR submitted to EDS on       PASRR number received on       Existing PASRR number confirmed on 07/18/17     FL2 transmitted to all facilities in geographic area requested by pt/family on 07/18/17     FL2 transmitted to all facilities within larger geographic area on       Patient informed that his/her managed care company has contracts with or will negotiate with certain facilities, including the following:        Yes   Patient/family informed of bed offers received.  Patient chooses bed at  [facility])     Physician recommends and patient chooses bed at  [facility])    Patient to be transferred to  [facility]) on 07/20/17.  Patient to be transferred to facility by  (EMS)     Patient family notified on 07/20/17 of transfer.  Name of family member notified:   (son)     PHYSICIAN       Additional Comment:     _______________________________________________ Shela Leff, LCSW 07/20/2017, 10:52 AM

## 2017-07-21 DIAGNOSIS — F0390 Unspecified dementia without behavioral disturbance: Secondary | ICD-10-CM | POA: Insufficient documentation

## 2017-07-31 IMAGING — US US ABDOMEN LIMITED
2 series · 14 of 25 positions shown · non-contrast
Comparison: None.

CLINICAL DATA: Elevated LFTs.

EXAM:
ULTRASOUND ABDOMEN LIMITED RIGHT UPPER QUADRANT

[Series 1: us abdomen limited · 0.17mm/px · 7 of 33 slices shown (1 of 2)]
[im 1/33]
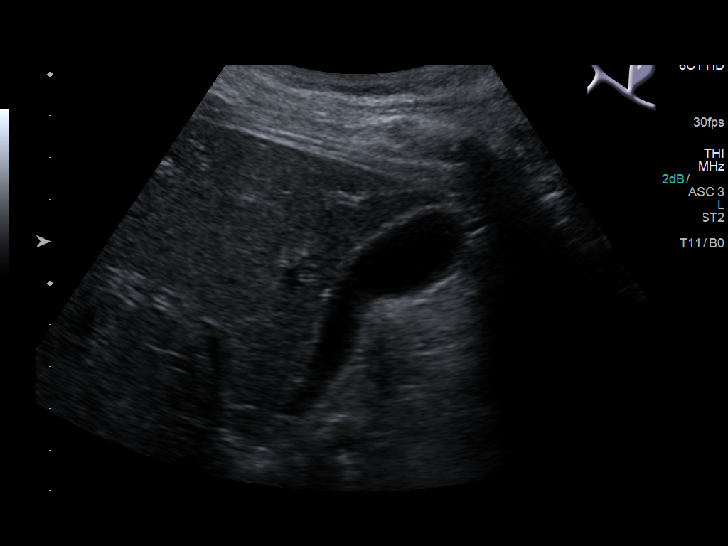
[im 6/33]
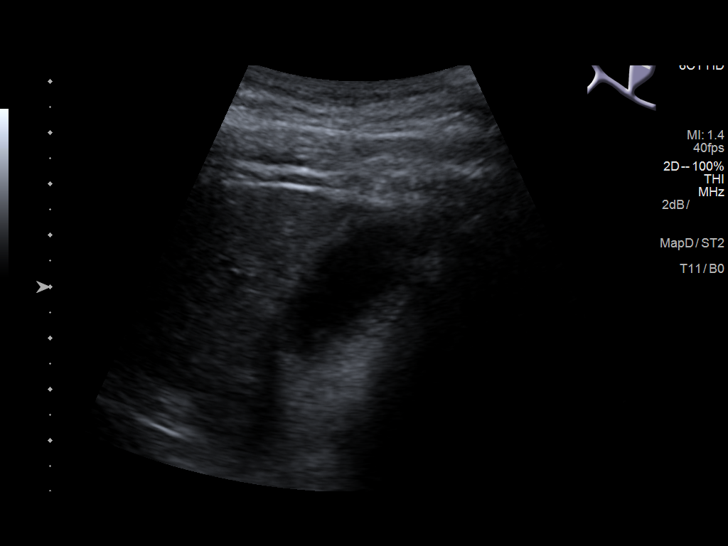
[im 11/33]
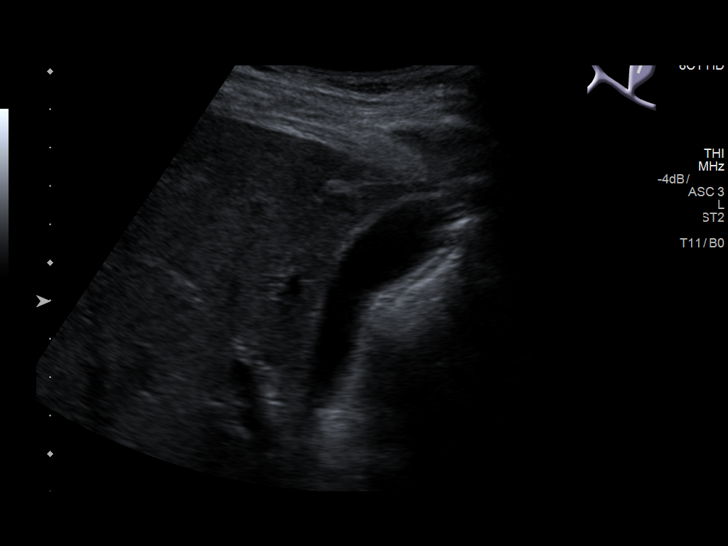
[im 17/33]
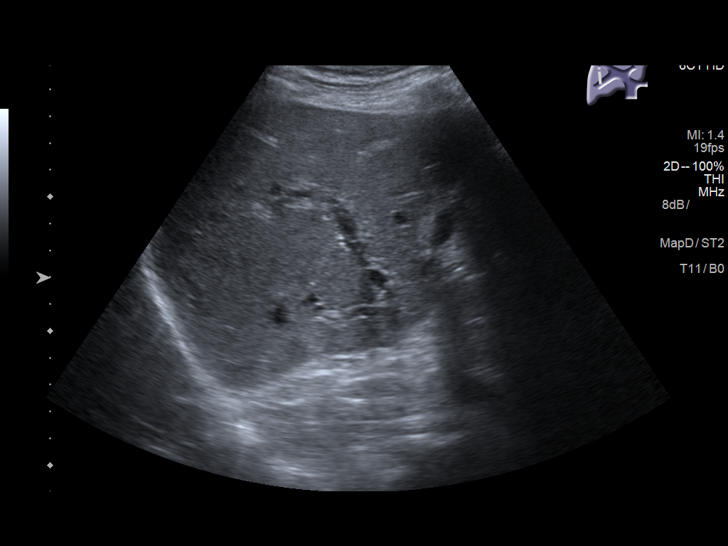
[im 22/33]
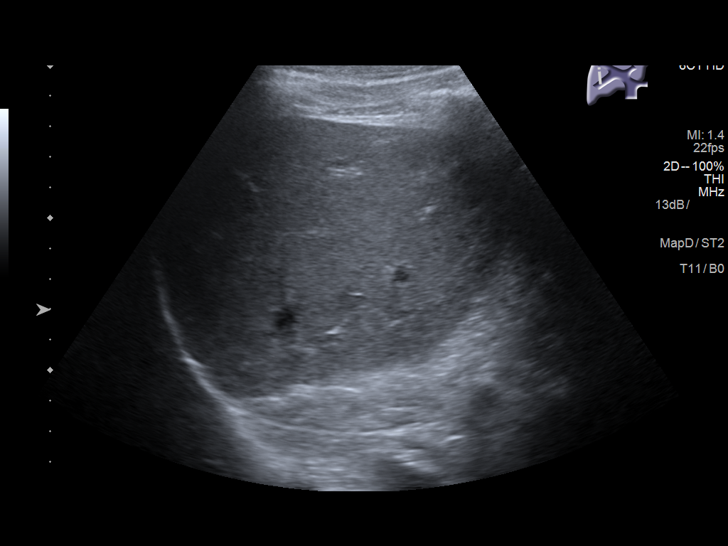
[im 25/33]
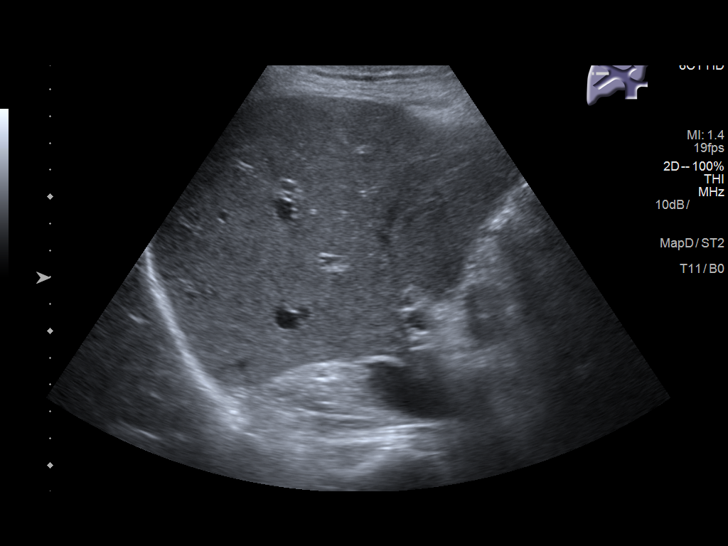
[im 30/33]
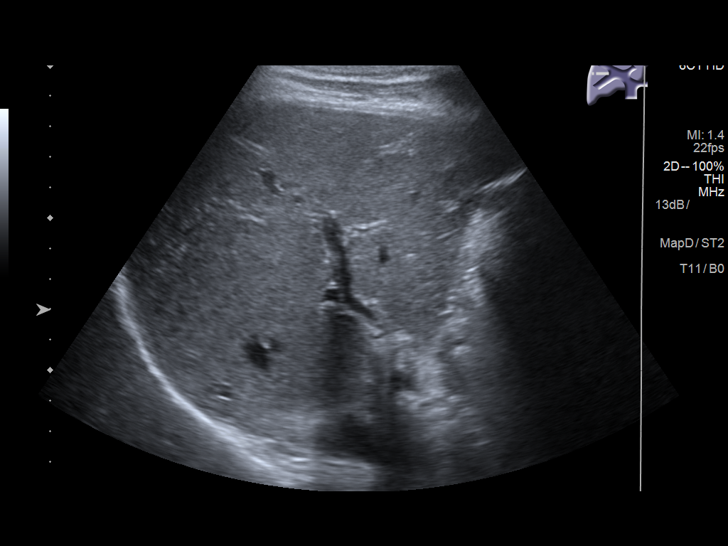

[Series 2001: us abdomen limited · 0.20mm/px · 33 acquisitions, 7 frames shown (2 of 2)]
[im 1/33]
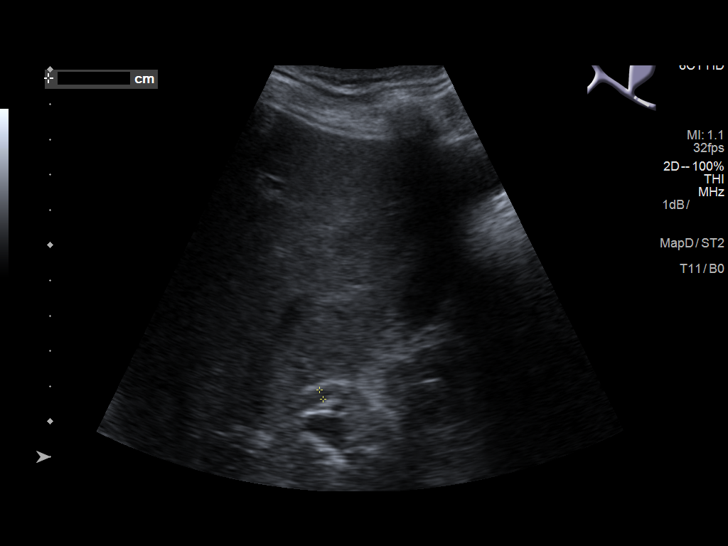
[im 6/33]
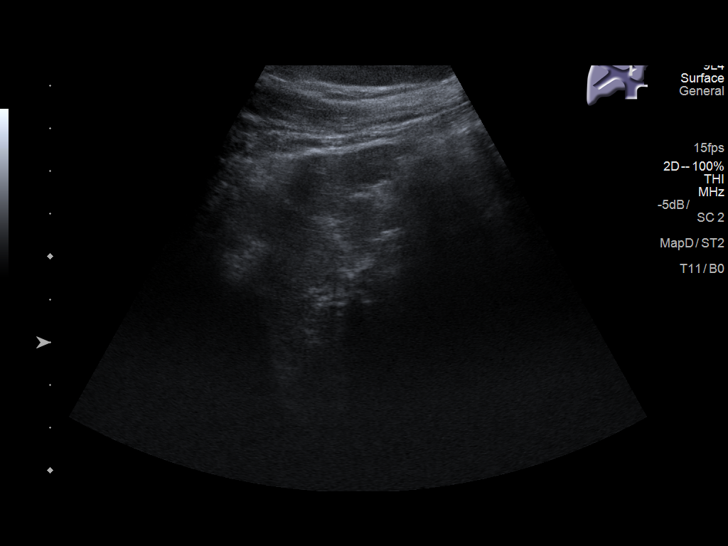
[im 9/33]
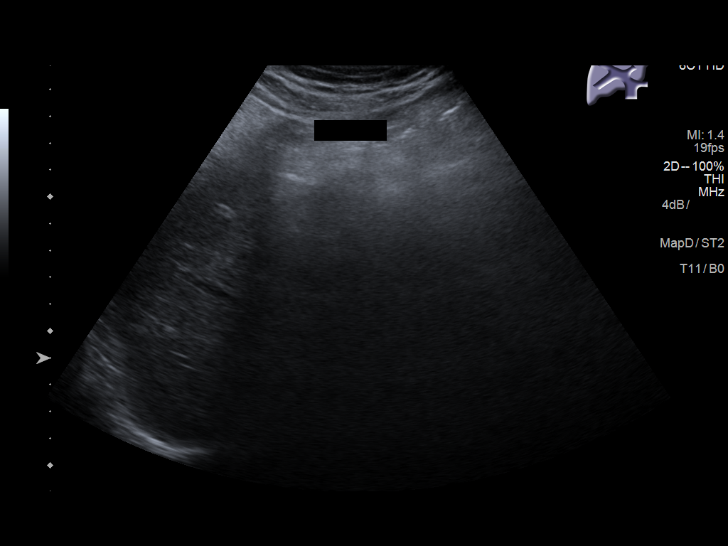
[im 15/33]
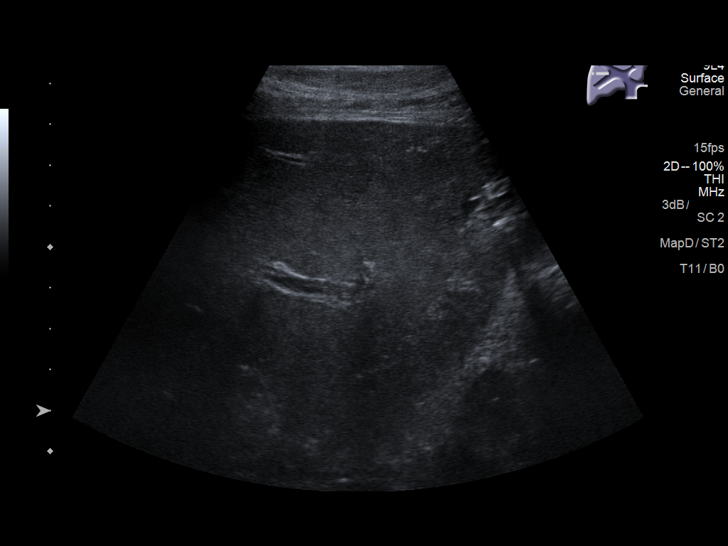
[im 21/33]
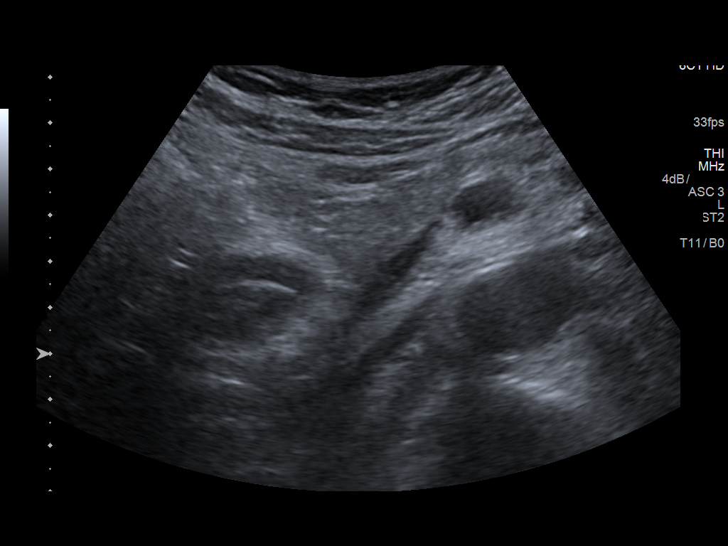
[im 27/33]
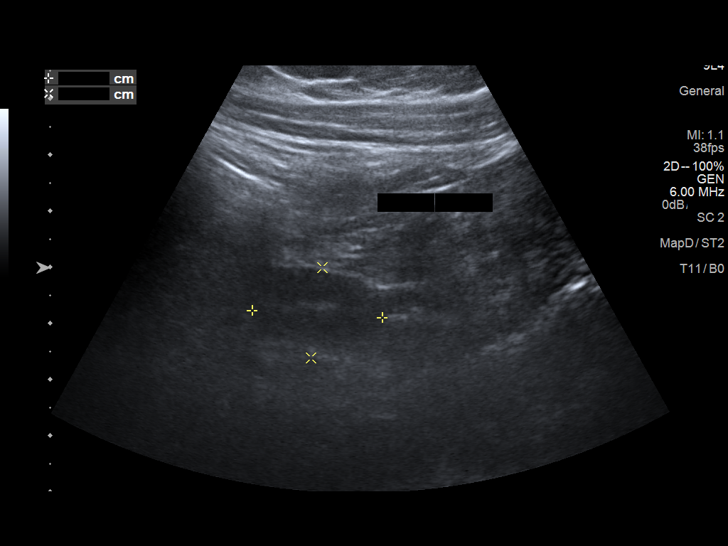
[im 33/33]
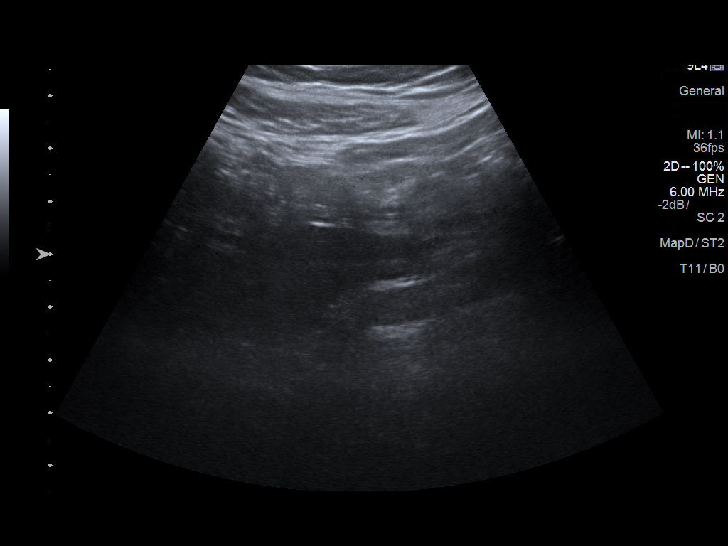

[14 of 25 positions shown; findings below may reference images not displayed]

FINDINGS: Gallbladder:

No gallstones or wall thickening visualized. No sonographic Murphy
sign noted by sonographer.

Common bile duct:

Diameter: 2.8 mm

Liver:

No focal lesion identified. Within normal limits in parenchymal
echogenicity.

Other: Indeterminate, solid appearing hypoechoic structure is
identified within the upper abdomen measuring 3.6 x 1.8 x 2.9 cm.
IMPRESSION: 1. Normal appearance of the liver and gallbladder.
2. There is an indeterminate, solid-appearing and hypoechoic
structure within the upper abdomen measuring 3.6 cm. Cannot rule out
mass or adenopathy. Recommend further investigation with contrast
enhanced CT or MRI of the upper abdomen.
These results will be called to the ordering clinician or
representative by the Radiologist Assistant, and communication
documented in the PACS or zVision Dashboard.

## 2017-08-02 NOTE — Progress Notes (Signed)
New PT note to include G codes.     July 27, 2017 1604  PT G-Codes **NOT FOR INPATIENT CLASS**  Functional Assessment Tool Used AM-PAC 6 Clicks Basic Mobility;Clinical judgement  Functional Limitation Mobility: Walking and moving around  Mobility: Walking and Moving Around Current Status (P1898) CM  Mobility: Walking and Moving Around Goal Status (M2103) CL   Collie Siad PT, DPT

## 2017-08-13 IMAGING — MR MR HEAD W/O CM
10 series · 48 of 48 positions shown · non-contrast
Comparison: Head CT 09/19/2016 and MRI 02/13/2011

CLINICAL DATA: Visual hallucinations. Recent carotid
endarterectomy.

EXAM:
MRI HEAD WITHOUT CONTRAST
TECHNIQUE: Multiplanar, multiecho pulse sequences of the brain and surrounding
structures were obtained without intravenous contrast.

[Series 2: T1 · sagittal · 5.0mm · 0.45mm/px · 4 of 27 slices shown (1 of 2)]
[im 1/27]
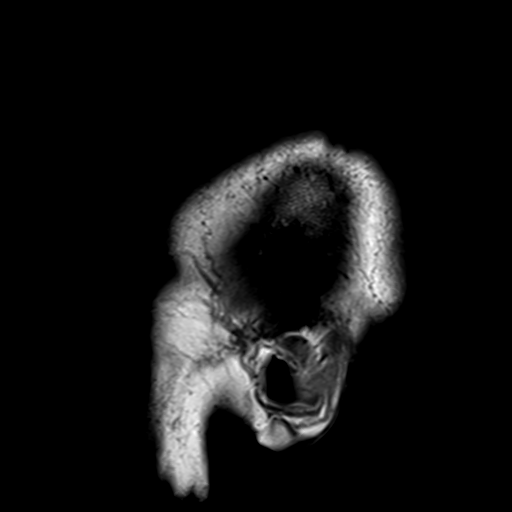
[im 9/27]
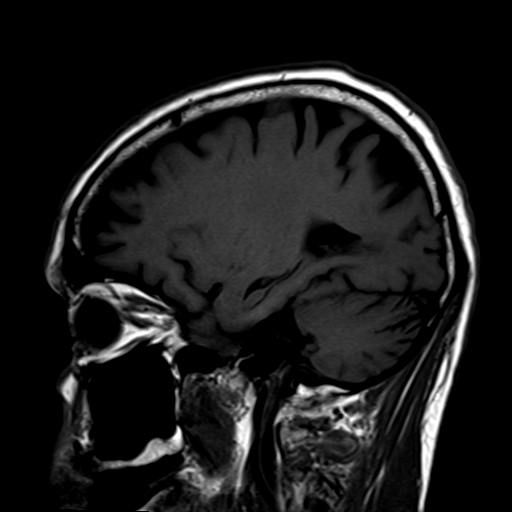
[im 18/27]
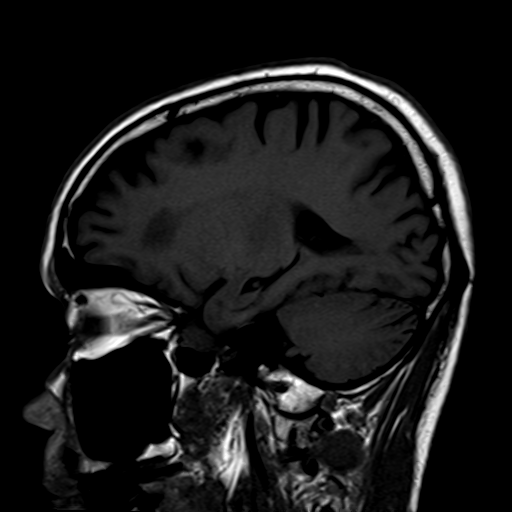
[im 27/27]
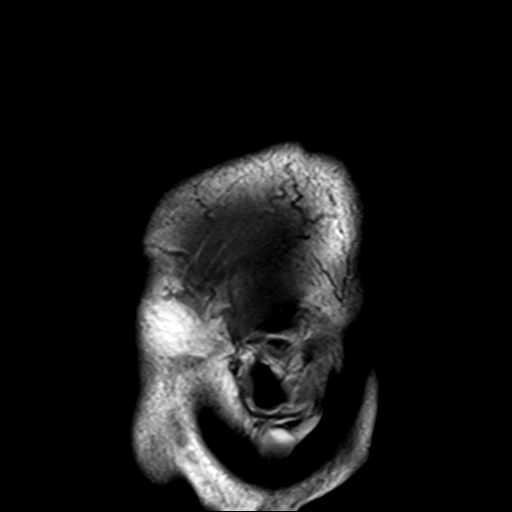

[Series 4: DWI · axial · 3.0mm · 1.80mm/px · z∈[-60,+100]mm · 7 of 54 slices shown (1 of 2)]
[im 1/54]
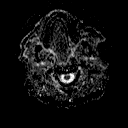
[im 9/54]
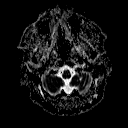
[im 18/54]
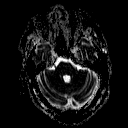
[im 27/54]
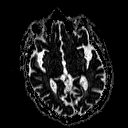
[im 36/54]
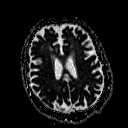
[im 45/54]
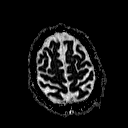
[im 54/54]
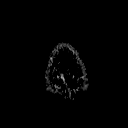

[Series 6: DWI · coronal · 3.0mm · 1.80mm/px · 5 of 45 slices shown (2 of 2)]
[im 1/45]
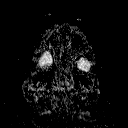
[im 12/45]
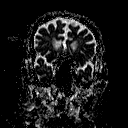
[im 23/45]
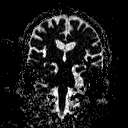
[im 34/45]
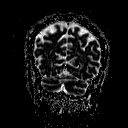
[im 45/45]
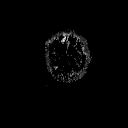

[Series 8: FLAIR · axial · 5.0mm · 0.45mm/px · z∈[-64,+103]mm · 3 of 27 slices shown]
[im 1/27]
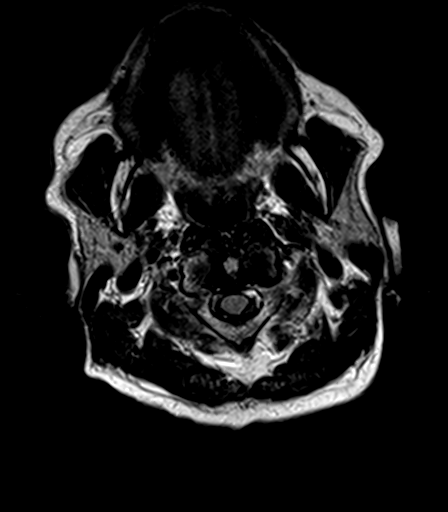
[im 14/27]
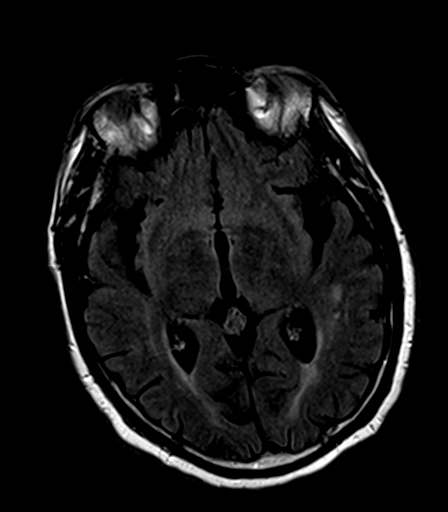
[im 27/27]
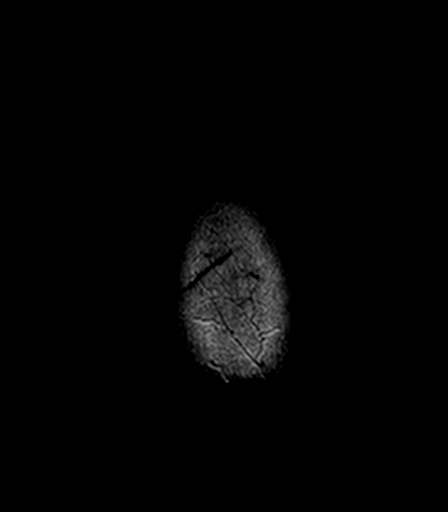

[Series 9: T2 · axial · 5.0mm · 0.45mm/px · z∈[-64,+103]mm · 3 of 27 slices shown (1 of 3)]
[im 1/27]
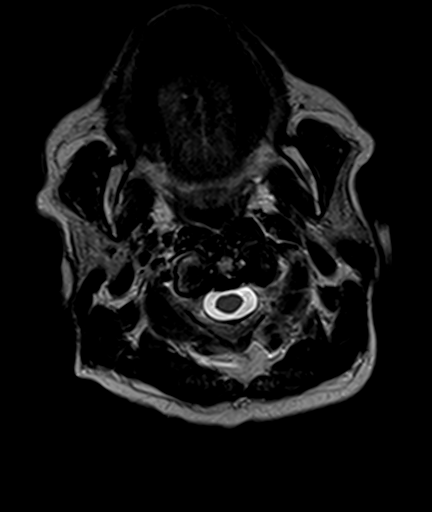
[im 14/27]
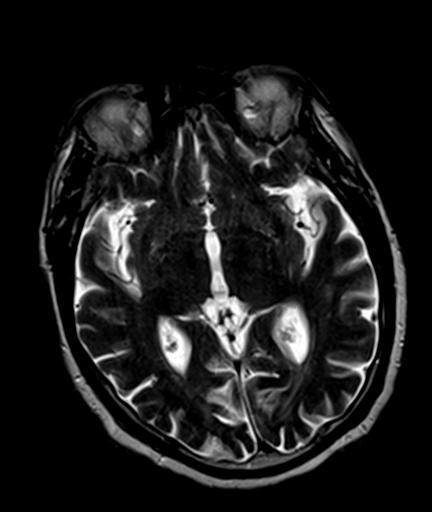
[im 27/27]
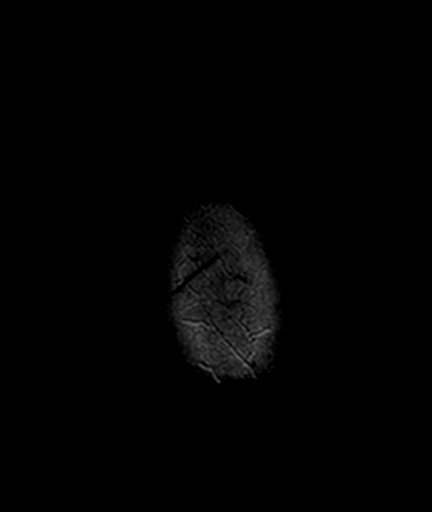

[Series 10: T2 · axial · 5.0mm · 0.45mm/px · z∈[-64,+103]mm · 3 of 27 slices shown (2 of 3)]
[im 1/27]
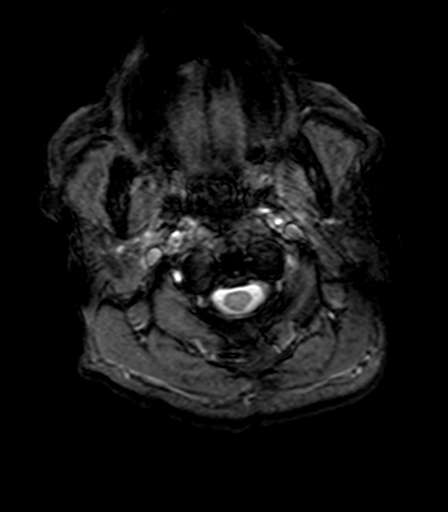
[im 14/27]
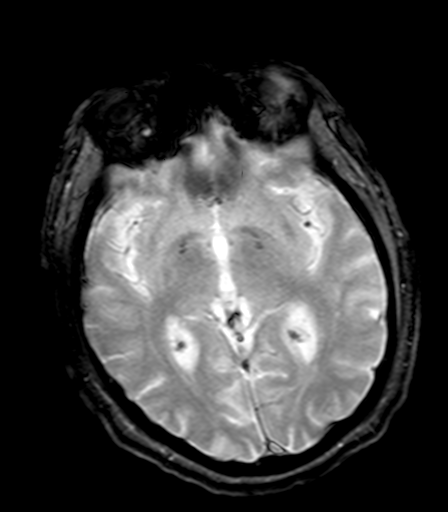
[im 27/27]
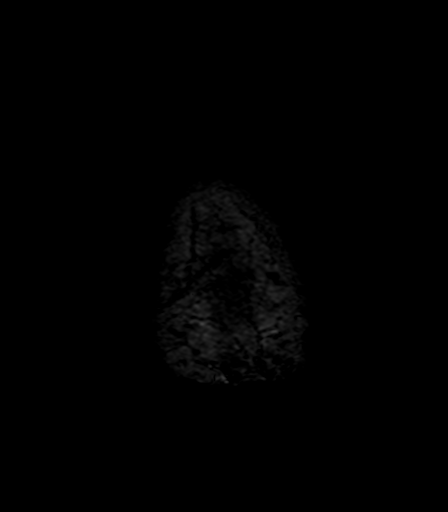

[Series 11: T1 · axial · 3.0mm · 1.00mm/px · z∈[-70,+104]mm · 7 of 60 slices shown (2 of 2)]
[im 1/60]
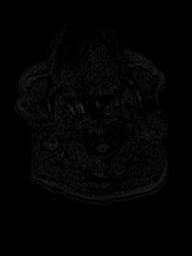
[im 10/60]
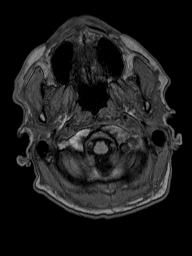
[im 20/60]
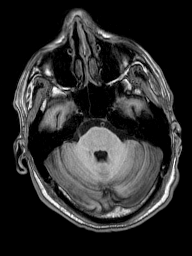
[im 30/60]
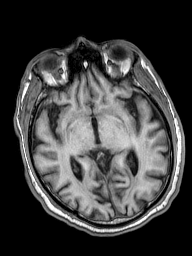
[im 40/60]
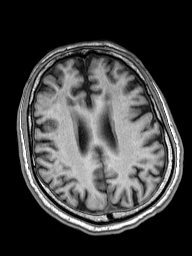
[im 50/60]
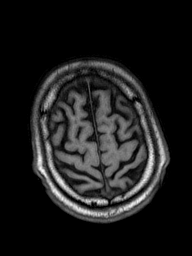
[im 60/60]
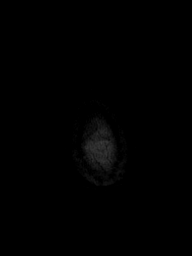

[Series 12: T2 · coronal · 5.0mm · 0.49mm/px · 4 of 29 slices shown (3 of 3)]
[im 1/29]
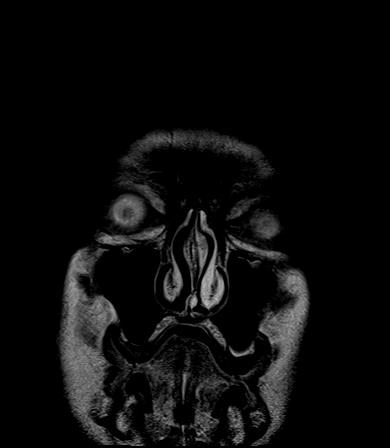
[im 10/29]
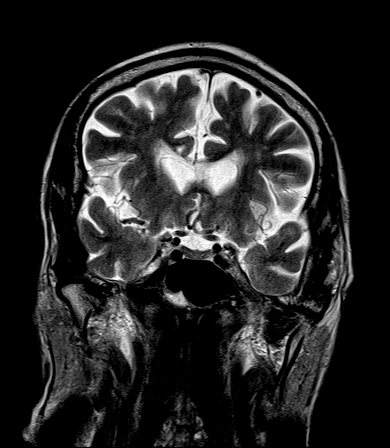
[im 19/29]
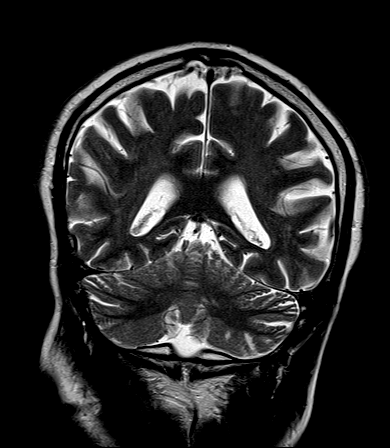
[im 29/29]
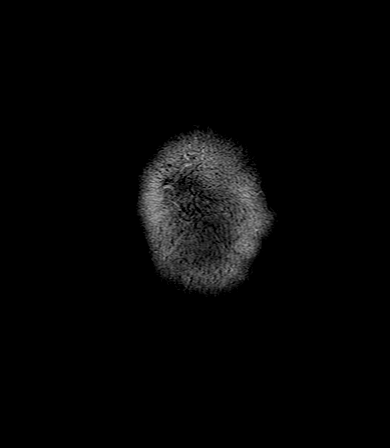

[Series 100: (id) · axial · 3.0mm · 1.80mm/px · z∈[-57,+100]mm · 7 of 54 slices shown]
[im 1/54]
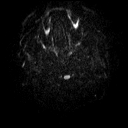
[im 9/54]
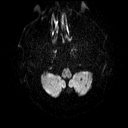
[im 18/54]
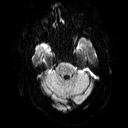
[im 27/54]
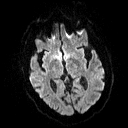
[im 36/54]
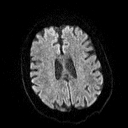
[im 45/54]
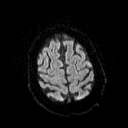
[im 54/54]
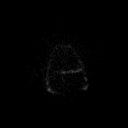

[Series 101: (id) cor · coronal · 3.0mm · 1.80mm/px · 5 of 45 slices shown]
[im 1/45]
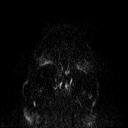
[im 12/45]
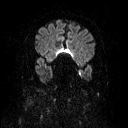
[im 23/45]
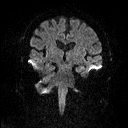
[im 34/45]
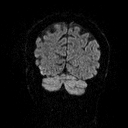
[im 45/45]
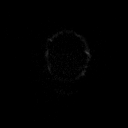

[48 of 48 positions shown; findings below may reference images not displayed]

FINDINGS: Brain: There is no evidence of acute infarct, intracranial
hemorrhage, mass, midline shift, or extra-axial fluid collection.
Mild cerebral atrophy is within normal limits for age. There are
multiple small chronic left cerebellar infarcts which have increased
in number from [phone]. Chronic infarcts are again seen involving the
anterior corpus callosum. Foci of T2 hyperintensity elsewhere in the
subcortical and deep cerebral white matter bilaterally are similar
to the prior MRI and nonspecific but compatible with
mild-to-moderate chronic small vessel ischemic disease.

Vascular: Major intracranial vascular flow voids are preserved.

Skull and upper cervical spine: Unremarkable bone marrow signal.

Sinuses/Orbits: Unremarkable.

Other: None.
IMPRESSION: 1. No acute intracranial abnormality.
2. Chronic left cerebellar infarcts, increased in number from [phone].
3. Mild-to-moderate chronic small vessel ischemic changes in the
cerebral white matter.

## 2017-09-15 DIAGNOSIS — Z0000 Encounter for general adult medical examination without abnormal findings: Secondary | ICD-10-CM | POA: Insufficient documentation

## 2018-01-24 ENCOUNTER — Ambulatory Visit (INDEPENDENT_AMBULATORY_CARE_PROVIDER_SITE_OTHER): Payer: Medicare Other | Admitting: Vascular Surgery

## 2018-01-24 ENCOUNTER — Encounter (INDEPENDENT_AMBULATORY_CARE_PROVIDER_SITE_OTHER): Payer: Self-pay | Admitting: Vascular Surgery

## 2018-01-24 VITALS — BP 139/68 | HR 76 | Resp 15 | Ht 69.0 in | Wt 211.0 lb

## 2018-01-24 DIAGNOSIS — M1612 Unilateral primary osteoarthritis, left hip: Secondary | ICD-10-CM | POA: Diagnosis not present

## 2018-01-24 DIAGNOSIS — I6523 Occlusion and stenosis of bilateral carotid arteries: Secondary | ICD-10-CM

## 2018-01-24 DIAGNOSIS — I2510 Atherosclerotic heart disease of native coronary artery without angina pectoris: Secondary | ICD-10-CM

## 2018-01-24 NOTE — Progress Notes (Signed)
MRN : [id number]  Kyle Whitaker is a 84 y.o. (06-25-1930) male who presents with chief complaint of  Chief Complaint  Patient presents with  . Follow-up    pulsating left side of neck area  .  History of Present Illness: The patient is seen for follow up evaluation of carotid stenosis. The carotid stenosis followed by ultrasound. He is concerned because he felt a pulsing in his left neck.  The patient denies amaurosis fugax. There is no recent history of TIA symptoms or focal motor deficits. There is no prior documented CVA.  The patient is taking enteric-coated aspirin 81 mg daily.  There is no history of migraine headaches. There is no history of seizures.  The patient has a history of coronary artery disease, no recent episodes of angina or shortness of breath. The patient denies PAD or claudication symptoms. There is a history of hyperlipidemia which is being treated with a statin.      Current Meds  Medication Sig  . amLODipine (NORVASC) 5 MG tablet Take 1 tablet (5 mg total) by mouth daily.  Marland Kitchen aspirin EC 81 MG EC tablet Take 1 tablet (81 mg total) by mouth daily.  Marland Kitchen atenolol (TENORMIN) 25 MG tablet Take 25 mg by mouth daily.  . diazepam (VALIUM) 2 MG tablet Take 1 tablet (2 mg total) by mouth 2 (two) times daily.  Marland Kitchen donepezil (ARICEPT) 10 MG tablet Take 10 mg by mouth at bedtime.  . feeding supplement, ENSURE ENLIVE, (ENSURE ENLIVE) LIQD Take 237 mLs by mouth 3 (three) times daily between meals.  . folic acid (FOLVITE) 1 MG tablet Take 1 mg by mouth daily.  . hydrochlorothiazide (HYDRODIURIL) 25 MG tablet Take 25 mg by mouth daily.  Marland Kitchen lisinopril (PRINIVIL,ZESTRIL) 20 MG tablet Take 20 mg by mouth daily.  . Magnesium Oxide 500 MG TABS Take 500 mg by mouth at bedtime.  . memantine (NAMENDA) 10 MG tablet Take 10 mg by mouth at bedtime.  . mirtazapine (REMERON) 30 MG tablet Take 30 mg by mouth at bedtime.  . Multiple Vitamin (MULTIVITAMIN WITH MINERALS) TABS tablet Take 1  tablet by mouth daily.  . risperiDONE (RISPERDAL M-TABS) 0.5 MG disintegrating tablet Take 1 tablet (0.5 mg total) by mouth at bedtime.  . thiamine 100 MG tablet Take 1 tablet (100 mg total) by mouth daily.  . Vitamin D, Ergocalciferol, (DRISDOL) 50000 units CAPS capsule Take 50,000 Units by mouth once a week. Fridays  . zinc gluconate 50 MG tablet Take 50 mg by mouth at bedtime.    Past Medical History:  Diagnosis Date  . Anxiety   . Arthritis   . Cancer [facility]) 2005   Prostate  . CHF (congestive heart failure) ([place])   . Coronary artery disease   . Hyperlipidemia   . Hypertension   . Peripheral vascular disease ([place])    carotid stenosis  . Shortness of breath dyspnea    on exertion    Past Surgical History:  Procedure Laterality Date  . CATARACT EXTRACTION W/PHACO Right 12/10/2016   Procedure: CATARACT EXTRACTION PHACO AND INTRAOCULAR LENS PLACEMENT (IOC);  Surgeon: Eulogio Bear, MD;  Location: [facility] ORS;  Service: Ophthalmology;  Laterality: Right;  Lot #[id number] H US:01:31.2 AP%:18.4 CDE:17.30  . CATARACT EXTRACTION W/PHACO Left 01/07/2017   Procedure: CATARACT EXTRACTION PHACO AND INTRAOCULAR LENS PLACEMENT (IOC);  Surgeon: Eulogio Bear, MD;  Location: [facility] ORS;  Service: Ophthalmology;  Laterality: Left;  Lot# [id number] H [place]: 01:05.8 AP%: 16.3 CDE: 10.72  .  ENDARTERECTOMY Right 07/17/2016   Procedure: ENDARTERECTOMY CAROTID;  Surgeon: Katha Cabal, MD;  Location: [facility] ORS;  Service: Vascular;  Laterality: Right;  . JOINT REPLACEMENT Right 2011   Total Hip Replacement, [facility]  . JOINT REPLACEMENT Right 2011   Total Knee Replacement, [facility]  . JOINT REPLACEMENT Left 2015   total hip replacement  . KNEE ARTHROSCOPY Right 2011  . PROSTATE SURGERY    . TOTAL HIP ARTHROPLASTY Left 10/29/2015   Procedure: TOTAL HIP ARTHROPLASTY ANTERIOR APPROACH;  Surgeon: Hessie Knows, MD;  Location: [facility] ORS;  Service: Orthopedics;  Laterality: Left;    Social History Social History     Tobacco Use  . Smoking status: Former Smoker    Packs/day: 1.00    Types: Cigarettes  . Smokeless tobacco: Never Used  . Tobacco comment: quit 50 years ago  Substance Use Topics  . Alcohol use: No  . Drug use: No    Family History Family History  Problem Relation Age of Onset  . CAD Mother     Allergies  Allergen Reactions  . Vicodin [Hydrocodone-Acetaminophen] Shortness Of Breath  . Penicillins Itching    Has patient had a PCN reaction causing immediate rash, facial/tongue/throat swelling, SOB or lightheadedness with hypotension: No Has patient had a PCN reaction causing severe rash involving mucus membranes or skin necrosis: No Has patient had a PCN reaction that required hospitalization No Has patient had a PCN reaction occurring within the last 10 years: No If all of the above answers are "NO", then may proceed with Cephalosporin use.      REVIEW OF SYSTEMS (Negative unless checked)  Constitutional: [] Weight loss  [] Fever  [] Chills Cardiac: [] Chest pain   [] Chest pressure   [] Palpitations   [] Shortness of breath when laying flat   [] Shortness of breath with exertion. Vascular:  [] Pain in legs with walking   [] Pain in legs at rest  [] History of DVT   [] Phlebitis   [] Swelling in legs   [] Varicose veins   [] Non-healing ulcers Pulmonary:   [] Uses home oxygen   [] Productive cough   [] Hemoptysis   [] Wheeze  [] COPD   [] Asthma Neurologic:  [] Dizziness   [] Seizures   [] History of stroke   [] History of TIA  [] Aphasia   [] Vissual changes   [] Weakness or numbness in arm   [] Weakness or numbness in leg Musculoskeletal:   [] Joint swelling   [] Joint pain   [] Low back pain Hematologic:  [] Easy bruising  [] Easy bleeding   [] Hypercoagulable state   [] Anemic Gastrointestinal:  [] Diarrhea   [] Vomiting  [] Gastroesophageal reflux/heartburn   [] Difficulty swallowing. Genitourinary:  [] Chronic kidney disease   [] Difficult urination  [] Frequent urination   [] Blood in urine Skin:  [] Rashes    [] Ulcers  Psychological:  [] History of anxiety   []  History of major depression.  Physical Examination  Vitals:   01/24/18 1458  BP: 139/68  Pulse: 76  Resp: 15  Weight: 211 lb (95.7 kg)  Height: 5\' 9"  (1.753 m)   Body mass index is 31.16 kg/m. Gen: WD/WN, NAD Head: [place]/AT, No temporalis wasting.  Ear/Nose/Throat: Hearing grossly intact, nares w/o erythema or drainage Eyes: PER, EOMI, sclera nonicteric.  Neck: Supple, no large masses.   Pulmonary:  Good air movement, no audible wheezing bilaterally, no use of accessory muscles.  Cardiac: RRR, no JVD Vascular:  Well helaed right CEA incisional scar, no carotid bruits Vessel Right Left  Radial Palpable Palpable  Ulnar Palpable Palpable  Brachial Palpable Palpable  Carotid Palpable Palpable  Gastrointestinal:  Non-distended. No guarding/no peritoneal signs.  Musculoskeletal: M/S 5/5 throughout.  No deformity or atrophy.  Neurologic: CN 2-12 intact. Symmetrical.  Speech is fluent. Motor exam as listed above. Psychiatric: Judgment intact, Mood & affect appropriate for pt's clinical situation. Dermatologic: No rashes or ulcers noted.  No changes consistent with cellulitis. Lymph : No lichenification or skin changes of chronic lymphedema.  CBC Lab Results  Component Value Date   WBC 11.0 (H) 07/17/2017   HGB 11.6 (L) 07/17/2017   HCT 34.2 (L) 07/17/2017   MCV 92.0 07/17/2017   PLT 198 07/17/2017    BMET    Component Value Date/Time   NA 143 07/17/2017 1011   NA 137 11/24/2013 1435   K 3.8 07/17/2017 1011   K 3.5 11/24/2013 1435   CL 105 07/17/2017 1011   CL 104 11/24/2013 1435   CO2 30 07/17/2017 1011   CO2 32 11/24/2013 1435   GLUCOSE 95 07/17/2017 1011   GLUCOSE 97 11/24/2013 1435   BUN 40 (H) 07/17/2017 1011   BUN 16 11/24/2013 1435   CREATININE 1.30 (H) 07/17/2017 1011   CREATININE 1.17 11/24/2013 1435   CALCIUM 8.2 (L) 07/17/2017 1011   CALCIUM 9.0 11/24/2013 1435   GFRNONAA 48 (L) 07/17/2017 1011    GFRNONAA 58 (L) 11/24/2013 1435   GFRAA 56 (L) 07/17/2017 1011   GFRAA >60 11/24/2013 1435   CrCl cannot be calculated (Patient's most recent lab result is older than the maximum 21 days allowed.).  COAG Lab Results  Component Value Date   INR 1.03 07/10/2016   INR 1.14 11/17/2015   INR 1.10 10/14/2015    Radiology No results found.   Assessment/Plan 1. Bilateral carotid artery stenosis Recommend:  Given the patient's asymptomatic subcritical stenosis no further invasive testing or surgery at this time.  Last year's duplex ultrasound shows <30% stenosis bilaterally.  Continue antiplatelet therapy as prescribed Continue management of CAD, HTN and Hyperlipidemia Healthy heart diet,  encouraged exercise at least 4 times per week Follow up in in March with a duplex as already ordered with duplex ultrasound and physical exam    2. Chronic coronary artery disease Continue cardiac and antihypertensive medications as already ordered and reviewed, no changes at this time.  Continue statin as ordered and reviewed, no changes at this time  Nitrates PRN for chest pain   3. Primary osteoarthritis of left hip Continue NSAID medications as already ordered, these medications have been reviewed and there are no changes at this time.     Hortencia Pilar, MD  01/24/2018 3:18 PM

## 2018-02-17 ENCOUNTER — Ambulatory Visit (INDEPENDENT_AMBULATORY_CARE_PROVIDER_SITE_OTHER): Payer: Medicare Other | Admitting: Vascular Surgery

## 2018-02-17 ENCOUNTER — Ambulatory Visit (INDEPENDENT_AMBULATORY_CARE_PROVIDER_SITE_OTHER): Payer: Medicare Other

## 2018-02-17 ENCOUNTER — Other Ambulatory Visit (INDEPENDENT_AMBULATORY_CARE_PROVIDER_SITE_OTHER): Payer: Self-pay | Admitting: Vascular Surgery

## 2018-02-17 ENCOUNTER — Encounter (INDEPENDENT_AMBULATORY_CARE_PROVIDER_SITE_OTHER): Payer: Self-pay | Admitting: Vascular Surgery

## 2018-02-17 VITALS — BP 139/65 | HR 70 | Resp 17 | Ht 68.0 in | Wt 205.0 lb

## 2018-02-17 DIAGNOSIS — I10 Essential (primary) hypertension: Secondary | ICD-10-CM | POA: Diagnosis not present

## 2018-02-17 DIAGNOSIS — M1612 Unilateral primary osteoarthritis, left hip: Secondary | ICD-10-CM | POA: Diagnosis not present

## 2018-02-17 DIAGNOSIS — I6523 Occlusion and stenosis of bilateral carotid arteries: Secondary | ICD-10-CM

## 2018-02-17 DIAGNOSIS — I2510 Atherosclerotic heart disease of native coronary artery without angina pectoris: Secondary | ICD-10-CM | POA: Diagnosis not present

## 2018-02-17 NOTE — Progress Notes (Signed)
MRN : [id number]  Kyle Whitaker is a 84 y.o. ([birth year]) male who presents with chief complaint of No chief complaint on file. Marland Kitchen  History of Present Illness: The patient is seen for follow up evaluation of carotid stenosis. The carotid stenosis followed by ultrasound.   The patient denies amaurosis fugax. There is no recent history of TIA symptoms or focal motor deficits. There is no prior documented CVA.  The patient is taking enteric-coated aspirin 81 mg daily.  There is no history of migraine headaches. There is no history of seizures.  The patient has a history of coronary artery disease, no recent episodes of angina or shortness of breath. The patient denies PAD or claudication symptoms. There is a history of hyperlipidemia which is being treated with a statin.      No outpatient medications have been marked as taking for the 02/17/18 encounter (Appointment) with Delana Meyer, Dolores Lory, MD.    Past Medical History:  Diagnosis Date  . Anxiety   . Arthritis   . Cancer [facility]) 2005   Prostate  . CHF (congestive heart failure) ([place])   . Coronary artery disease   . Hyperlipidemia   . Hypertension   . Peripheral vascular disease ([place])    carotid stenosis  . Shortness of breath dyspnea    on exertion    Past Surgical History:  Procedure Laterality Date  . CATARACT EXTRACTION W/PHACO Right 12/10/2016   Procedure: CATARACT EXTRACTION PHACO AND INTRAOCULAR LENS PLACEMENT (IOC);  Surgeon: Eulogio Bear, MD;  Location: [facility] ORS;  Service: Ophthalmology;  Laterality: Right;  Lot #[id number] H US:01:31.2 AP%:18.4 CDE:17.30  . CATARACT EXTRACTION W/PHACO Left 01/07/2017   Procedure: CATARACT EXTRACTION PHACO AND INTRAOCULAR LENS PLACEMENT (IOC);  Surgeon: Eulogio Bear, MD;  Location: [facility] ORS;  Service: Ophthalmology;  Laterality: Left;  Lot# [id number] H [place]: 01:05.8 AP%: 16.3 CDE: 10.72  . ENDARTERECTOMY Right 07/17/2016   Procedure: ENDARTERECTOMY CAROTID;  Surgeon: Katha Cabal, MD;  Location: [facility] ORS;  Service: Vascular;  Laterality: Right;  . JOINT REPLACEMENT Right 2011   Total Hip Replacement, [facility]  . JOINT REPLACEMENT Right 2011   Total Knee Replacement, [facility]  . JOINT REPLACEMENT Left 2015   total hip replacement  . KNEE ARTHROSCOPY Right 2011  . PROSTATE SURGERY    . TOTAL HIP ARTHROPLASTY Left 10/29/2015   Procedure: TOTAL HIP ARTHROPLASTY ANTERIOR APPROACH;  Surgeon: Hessie Knows, MD;  Location: [facility] ORS;  Service: Orthopedics;  Laterality: Left;    Social History Social History   Tobacco Use  . Smoking status: Former Smoker    Packs/day: 1.00    Types: Cigarettes  . Smokeless tobacco: Never Used  . Tobacco comment: quit 50 years ago  Substance Use Topics  . Alcohol use: No  . Drug use: No    Family History Family History  Problem Relation Age of Onset  . CAD Mother     Allergies  Allergen Reactions  . Vicodin [Hydrocodone-Acetaminophen] Shortness Of Breath  . Penicillins Itching    Has patient had a PCN reaction causing immediate rash, facial/tongue/throat swelling, SOB or lightheadedness with hypotension: No Has patient had a PCN reaction causing severe rash involving mucus membranes or skin necrosis: No Has patient had a PCN reaction that required hospitalization No Has patient had a PCN reaction occurring within the last 10 years: No If all of the above answers are "NO", then may proceed with Cephalosporin use.      REVIEW OF SYSTEMS (Negative unless  checked)  Constitutional: [] Weight loss  [] Fever  [] Chills Cardiac: [] Chest pain   [] Chest pressure   [] Palpitations   [] Shortness of breath when laying flat   [] Shortness of breath with exertion. Vascular:  [] Pain in legs with walking   [] Pain in legs at rest  [] History of DVT   [] Phlebitis   [] Swelling in legs   [] Varicose veins   [] Non-healing ulcers Pulmonary:   [] Uses home oxygen   [] Productive cough   [] Hemoptysis   [] Wheeze  [] COPD   [] Asthma Neurologic:   [] Dizziness   [] Seizures   [] History of stroke   [] History of TIA  [] Aphasia   [] Vissual changes   [] Weakness or numbness in arm   [] Weakness or numbness in leg Musculoskeletal:   [] Joint swelling   [] Joint pain   [] Low back pain Hematologic:  [] Easy bruising  [] Easy bleeding   [] Hypercoagulable state   [] Anemic Gastrointestinal:  [] Diarrhea   [] Vomiting  [] Gastroesophageal reflux/heartburn   [] Difficulty swallowing. Genitourinary:  [] Chronic kidney disease   [] Difficult urination  [] Frequent urination   [] Blood in urine Skin:  [] Rashes   [] Ulcers  Psychological:  [] History of anxiety   []  History of major depression.  Physical Examination  There were no vitals filed for this visit. There is no height or weight on file to calculate BMI. Gen: WD/WN, NAD Head: [place]/AT, No temporalis wasting.  Ear/Nose/Throat: Hearing grossly intact, nares w/o erythema or drainage Eyes: PER, EOMI, sclera nonicteric.  Neck: Supple, no large masses.   Pulmonary:  Good air movement, no audible wheezing bilaterally, no use of accessory muscles.  Cardiac: RRR, no JVD Vascular:  Right carotid incisional scar well healed; + bruits Vessel Right Left  Radial Palpable Palpable  Ulnar Palpable Palpable  Brachial Palpable Palpable  Carotid Palpable Palpable  Gastrointestinal: Non-distended. No guarding/no peritoneal signs.  Musculoskeletal: M/S 5/5 throughout.  No deformity or atrophy.  Neurologic: CN 2-12 intact. Symmetrical.  Speech is fluent. Motor exam as listed above. Psychiatric: Judgment intact, Mood & affect appropriate for pt's clinical situation. Dermatologic: No rashes or ulcers noted.  No changes consistent with cellulitis. Lymph : No lichenification or skin changes of chronic lymphedema.  CBC Lab Results  Component Value Date   WBC 11.0 (H) 07/17/2017   HGB 11.6 (L) 07/17/2017   HCT 34.2 (L) 07/17/2017   MCV 92.0 07/17/2017   PLT 198 07/17/2017    BMET    Component Value Date/Time   NA 143  07/17/2017 1011   NA 137 11/24/2013 1435   K 3.8 07/17/2017 1011   K 3.5 11/24/2013 1435   CL 105 07/17/2017 1011   CL 104 11/24/2013 1435   CO2 30 07/17/2017 1011   CO2 32 11/24/2013 1435   GLUCOSE 95 07/17/2017 1011   GLUCOSE 97 11/24/2013 1435   BUN 40 (H) 07/17/2017 1011   BUN 16 11/24/2013 1435   CREATININE 1.30 (H) 07/17/2017 1011   CREATININE 1.17 11/24/2013 1435   CALCIUM 8.2 (L) 07/17/2017 1011   CALCIUM 9.0 11/24/2013 1435   GFRNONAA 48 (L) 07/17/2017 1011   GFRNONAA 58 (L) 11/24/2013 1435   GFRAA 56 (L) 07/17/2017 1011   GFRAA >60 11/24/2013 1435   CrCl cannot be calculated (Patient's most recent lab result is older than the maximum 21 days allowed.).  COAG Lab Results  Component Value Date   INR 1.03 07/10/2016   INR 1.14 11/17/2015   INR 1.10 10/14/2015    Radiology No results found.    Assessment/Plan 1. Bilateral carotid artery  stenosis Recommend:  Given the patient's asymptomatic subcritical stenosis no further invasive testing or surgery at this time.  Duplex ultrasound shows <50% stenosis bilaterally.  Continue antiplatelet therapy as prescribed Continue management of CAD, HTN and Hyperlipidemia Healthy heart diet,  encouraged exercise at least 4 times per week Follow up in 6 months with duplex ultrasound and physical exam   2. Chronic coronary artery disease Continue cardiac and antihypertensive medications as already ordered and reviewed, no changes at this time.  Continue statin as ordered and reviewed, no changes at this time  Nitrates PRN for chest pain   3. Essential hypertension Continue antihypertensive medications as already ordered, these medications have been reviewed and there are no changes at this time.   4. Primary osteoarthritis of left hip Continue NSAID medications as already ordered, these medications have been reviewed and there are no changes at this time.  Continued activity and therapy was  stressed.     Hortencia Pilar, MD  02/17/2018 1:02 PM

## 2018-03-03 ENCOUNTER — Ambulatory Visit (INDEPENDENT_AMBULATORY_CARE_PROVIDER_SITE_OTHER): Payer: Medicare Other | Admitting: Vascular Surgery

## 2018-03-03 ENCOUNTER — Encounter (INDEPENDENT_AMBULATORY_CARE_PROVIDER_SITE_OTHER): Payer: Medicare Other

## 2018-03-03 ENCOUNTER — Encounter (INDEPENDENT_AMBULATORY_CARE_PROVIDER_SITE_OTHER): Payer: Self-pay

## 2018-09-16 DIAGNOSIS — Z789 Other specified health status: Secondary | ICD-10-CM | POA: Insufficient documentation

## 2019-02-16 ENCOUNTER — Ambulatory Visit (INDEPENDENT_AMBULATORY_CARE_PROVIDER_SITE_OTHER): Payer: Medicare Other | Admitting: Vascular Surgery

## 2019-02-16 ENCOUNTER — Encounter (INDEPENDENT_AMBULATORY_CARE_PROVIDER_SITE_OTHER): Payer: Medicare Other

## 2019-04-27 ENCOUNTER — Encounter (INDEPENDENT_AMBULATORY_CARE_PROVIDER_SITE_OTHER): Payer: Medicare Other

## 2019-04-27 ENCOUNTER — Ambulatory Visit (INDEPENDENT_AMBULATORY_CARE_PROVIDER_SITE_OTHER): Payer: Medicare Other | Admitting: Vascular Surgery

## 2020-08-06 ENCOUNTER — Other Ambulatory Visit (INDEPENDENT_AMBULATORY_CARE_PROVIDER_SITE_OTHER): Payer: Self-pay | Admitting: Vascular Surgery

## 2020-08-06 DIAGNOSIS — I6523 Occlusion and stenosis of bilateral carotid arteries: Secondary | ICD-10-CM

## 2020-08-07 ENCOUNTER — Ambulatory Visit (INDEPENDENT_AMBULATORY_CARE_PROVIDER_SITE_OTHER): Payer: Medicare Other

## 2020-08-07 ENCOUNTER — Encounter (INDEPENDENT_AMBULATORY_CARE_PROVIDER_SITE_OTHER): Payer: Self-pay | Admitting: Nurse Practitioner

## 2020-08-07 ENCOUNTER — Ambulatory Visit (INDEPENDENT_AMBULATORY_CARE_PROVIDER_SITE_OTHER): Payer: Medicare Other | Admitting: Nurse Practitioner

## 2020-08-07 ENCOUNTER — Other Ambulatory Visit: Payer: Self-pay

## 2020-08-07 VITALS — BP 169/70 | HR 70 | Ht 68.0 in | Wt 178.0 lb

## 2020-08-07 DIAGNOSIS — I10 Essential (primary) hypertension: Secondary | ICD-10-CM

## 2020-08-07 DIAGNOSIS — I6523 Occlusion and stenosis of bilateral carotid arteries: Secondary | ICD-10-CM | POA: Diagnosis not present

## 2020-08-07 DIAGNOSIS — M1612 Unilateral primary osteoarthritis, left hip: Secondary | ICD-10-CM | POA: Diagnosis not present

## 2020-08-07 DIAGNOSIS — I129 Hypertensive chronic kidney disease with stage 1 through stage 4 chronic kidney disease, or unspecified chronic kidney disease: Secondary | ICD-10-CM | POA: Insufficient documentation

## 2020-08-07 NOTE — Progress Notes (Signed)
Subjective:    Patient ID: Kyle Whitaker, male    DOB: 06-12-1930, 86 y.o.   MRN: [id number] Chief Complaint  Patient presents with  . Follow-up    u/S follow up    The patient is seen for follow up evaluation of carotid stenosis. The carotid stenosis followed by ultrasound.  Patient previously underwent carotid endarterectomy of the right ICA in 2017  The patient denies amaurosis fugax. There is no recent history of TIA symptoms or focal motor deficits. There is no prior documented CVA.  The patient is taking enteric-coated aspirin 81 mg daily.  There is no history of migraine headaches. There is no history of seizures.  The patient has a history of coronary artery disease, no recent episodes of angina or shortness of breath. The patient denies PAD or claudication symptoms. There is a history of hyperlipidemia which is being treated with a statin.    Duplex ultrasound shows 1-39%  stenosis of the right internal carotid artery, with 40 to 59% stenosis of the left internal carotid artery     Review of Systems  Eyes: Negative for visual disturbance.  Musculoskeletal: Positive for arthralgias.  Neurological: Positive for weakness.  All other systems reviewed and are negative.      Objective:   Physical Exam Vitals reviewed.  HENT:     Head: Normocephalic.  Cardiovascular:     Rate and Rhythm: Normal rate and regular rhythm.     Pulses: Normal pulses.     Heart sounds: Normal heart sounds.  Pulmonary:     Effort: Pulmonary effort is normal.  Neurological:     Mental Status: He is alert and oriented to person, place, and time.     Motor: Weakness present.     Gait: Gait abnormal.  Psychiatric:        Mood and Affect: Mood normal.        Speech: Speech is rapid and pressured.        Behavior: Behavior normal.        Thought Content: Thought content normal.        Judgment: Judgment normal.     BP (!) 169/70   Pulse 70   Ht 5\' 8"  (1.727 m)   Wt 178 lb (80.7 kg)    BMI 27.06 kg/m   Past Medical History:  Diagnosis Date  . Anxiety   . Arthritis   . Cancer [facility]) 2005   Prostate  . CHF (congestive heart failure) ([place])   . Coronary artery disease   . Hyperlipidemia   . Hypertension   . Peripheral vascular disease ([place])    carotid stenosis  . Shortness of breath dyspnea    on exertion    Social History   Socioeconomic History  . Marital status: Widowed    Spouse name: Not on file  . Number of children: Not on file  . Years of education: Not on file  . Highest education level: Not on file  Occupational History  . Not on file  Tobacco Use  . Smoking status: Former Smoker    Packs/day: 1.00    Types: Cigarettes  . Smokeless tobacco: Never Used  . Tobacco comment: quit 50 years ago  Substance and Sexual Activity  . Alcohol use: No  . Drug use: No  . Sexual activity: Never  Other Topics Concern  . Not on file  Social History Narrative   Lives with son, has a cane to ambulate   Social Determinants of Health  Financial Resource Strain:   . Difficulty of Paying Living Expenses:   Food Insecurity:   . Worried About Charity fundraiser in the Last Year:   . Arboriculturist in the Last Year:   Transportation Needs:   . Film/video editor (Medical):   Marland Kitchen Lack of Transportation (Non-Medical):   Physical Activity:   . Days of Exercise per Week:   . Minutes of Exercise per Session:   Stress:   . Feeling of Stress :   Social Connections:   . Frequency of Communication with Friends and Family:   . Frequency of Social Gatherings with Friends and Family:   . Attends Religious Services:   . Active Member of Clubs or Organizations:   . Attends Archivist Meetings:   Marland Kitchen Marital Status:   Intimate Partner Violence:   . Fear of Current or Ex-Partner:   . Emotionally Abused:   Marland Kitchen Physically Abused:   . Sexually Abused:     Past Surgical History:  Procedure Laterality Date  . CATARACT EXTRACTION W/PHACO Right 12/10/2016    Procedure: CATARACT EXTRACTION PHACO AND INTRAOCULAR LENS PLACEMENT (IOC);  Surgeon: Eulogio Bear, MD;  Location: [facility] ORS;  Service: Ophthalmology;  Laterality: Right;  Lot #[id number] H US:01:31.2 AP%:18.4 CDE:17.30  . CATARACT EXTRACTION W/PHACO Left 01/07/2017   Procedure: CATARACT EXTRACTION PHACO AND INTRAOCULAR LENS PLACEMENT (IOC);  Surgeon: Eulogio Bear, MD;  Location: [facility] ORS;  Service: Ophthalmology;  Laterality: Left;  Lot# [id number] H [place]: 01:05.8 AP%: 16.3 CDE: 10.72  . ENDARTERECTOMY Right 07/17/2016   Procedure: ENDARTERECTOMY CAROTID;  Surgeon: Katha Cabal, MD;  Location: [facility] ORS;  Service: Vascular;  Laterality: Right;  . JOINT REPLACEMENT Right 2011   Total Hip Replacement, [facility]  . JOINT REPLACEMENT Right 2011   Total Knee Replacement, [facility]  . JOINT REPLACEMENT Left 2015   total hip replacement  . KNEE ARTHROSCOPY Right 2011  . PROSTATE SURGERY    . TOTAL HIP ARTHROPLASTY Left 10/29/2015   Procedure: TOTAL HIP ARTHROPLASTY ANTERIOR APPROACH;  Surgeon: Hessie Knows, MD;  Location: [facility] ORS;  Service: Orthopedics;  Laterality: Left;    Family History  Problem Relation Age of Onset  . CAD Mother     Allergies  Allergen Reactions  . Vicodin [Hydrocodone-Acetaminophen] Shortness Of Breath  . Penicillins Itching    Has patient had a PCN reaction causing immediate rash, facial/tongue/throat swelling, SOB or lightheadedness with hypotension: No Has patient had a PCN reaction causing severe rash involving mucus membranes or skin necrosis: No Has patient had a PCN reaction that required hospitalization No Has patient had a PCN reaction occurring within the last 10 years: No If all of the above answers are "NO", then may proceed with Cephalosporin use.        Assessment & Plan:   1. Bilateral carotid artery stenosis Recommend:  Given the patient's asymptomatic subcritical stenosis no further invasive testing or surgery at this time.  Duplex ultrasound  shows 1-39%  stenosis of the right internal carotid artery, with 40 to 59% stenosis of the left internal carotid artery   Continue antiplatelet therapy as prescribed Continue management of CAD, HTN and Hyperlipidemia Healthy heart diet,  encouraged exercise at least 4 times per week Follow up in 12 months with duplex ultrasound and physical exam   2. Essential hypertension Continue antihypertensive medications as already ordered, these medications have been reviewed and there are no changes at this time.   3. Primary osteoarthritis of left  hip Continue NSAID medications as already ordered, these medications have been reviewed and there are no changes at this time.  Continued activity and therapy was stressed.    Current Outpatient Medications on File Prior to Visit  Medication Sig Dispense Refill  . aspirin EC 81 MG EC tablet Take 1 tablet (81 mg total) by mouth daily. 90 tablet 3  . atenolol (TENORMIN) 25 MG tablet Take 25 mg by mouth daily.    . cyanocobalamin (,VITAMIN B-12,) 1000 MCG/ML injection Inject into the muscle.    Marland Kitchen DIPHENHIST 25 MG capsule Take by mouth.    . donepezil (ARICEPT) 10 MG tablet Take 10 mg by mouth at bedtime.    Neta Ehlers ANTACID/ANTI-GAS 200-200-20 MG/5ML suspension Take by mouth.    Marland Kitchen guaiFENesin (ROBITUSSIN) 100 MG/5ML liquid Take by mouth.    . hydrOXYzine (VISTARIL) 25 MG capsule Take by mouth.    . memantine (NAMENDA) 5 MG tablet     . MILK OF MAGNESIA 400 MG/5ML suspension Take by mouth.    . Multiple Vitamin (MULTIVITAMIN WITH MINERALS) TABS tablet Take 1 tablet by mouth daily. 30 tablet 0  . amLODipine (NORVASC) 10 MG tablet     . amLODipine (NORVASC) 5 MG tablet Take 1 tablet (5 mg total) by mouth daily. (Patient not taking: Reported on 08/07/2020) 30 tablet 2  . ARIPiprazole (ABILIFY) 10 MG tablet Take by mouth. (Patient not taking: Reported on 08/07/2020)    . clonazePAM (KLONOPIN) 0.5 MG tablet Take by mouth. (Patient not taking: Reported on  08/07/2020)    . diazepam (VALIUM) 2 MG tablet Take 1 tablet (2 mg total) by mouth 2 (two) times daily. (Patient not taking: Reported on 08/07/2020) 30 tablet 0  . diazepam (VALIUM) 2 MG tablet Take 1 tablet by mouth 2 (two) times daily. (Patient not taking: Reported on 08/07/2020)    . Eszopiclone 3 MG TABS Take by mouth. (Patient not taking: Reported on 08/07/2020)    . feeding supplement, ENSURE ENLIVE, (ENSURE ENLIVE) LIQD Take 237 mLs by mouth 3 (three) times daily between meals. 419 mL 12  . folic acid (FOLVITE) 1 MG tablet Take 1 mg by mouth daily. (Patient not taking: Reported on 08/07/2020)    . hydrochlorothiazide (HYDRODIURIL) 25 MG tablet Take 25 mg by mouth daily. (Patient not taking: Reported on 08/07/2020)    . lisinopril-hydrochlorothiazide (PRINZIDE,ZESTORETIC) 20-25 MG tablet Take 1 tablet by mouth daily. (Patient not taking: Reported on 08/07/2020)  1  . lithium citrate 300 MG/5ML solution Take by mouth. (Patient not taking: Reported on 08/07/2020)    . LOPERAMIDE A-D 2 MG tablet 2 mg elemental calcium/kg/hr.    . Magnesium Oxide 500 MG TABS Take 500 mg by mouth at bedtime. (Patient not taking: Reported on 08/07/2020)    . Melatonin 3 MG TABS Take by mouth. (Patient not taking: Reported on 08/07/2020)    . memantine (NAMENDA) 10 MG tablet Take 10 mg by mouth at bedtime. (Patient not taking: Reported on 08/07/2020)    . mirtazapine (REMERON) 30 MG tablet Take 30 mg by mouth at bedtime. (Patient not taking: Reported on 08/07/2020)    . prednisoLONE acetate (PRED FORTE) 1 % ophthalmic suspension Administer 1 drop to both eyes Four (4) times a day. (Patient not taking: Reported on 08/07/2020)    . pyridOXINE (B-6) 50 MG tablet Take by mouth. (Patient not taking: Reported on 08/07/2020)    . risperiDONE (RISPERDAL M-TABS) 0.5 MG disintegrating tablet Take 1 tablet (0.5 mg total)  by mouth at bedtime. (Patient not taking: Reported on 08/07/2020) 25 tablet 0  . risperiDONE (RISPERDAL) 0.5 MG tablet Take  by mouth. (Patient not taking: Reported on 08/07/2020)    . thiamine 100 MG tablet Take 1 tablet (100 mg total) by mouth daily. (Patient not taking: Reported on 08/07/2020) 30 tablet 0  . Vitamin D, Ergocalciferol, (DRISDOL) 50000 units CAPS capsule Take 50,000 Units by mouth once a week. Fridays (Patient not taking: Reported on 08/07/2020)    . zinc gluconate 50 MG tablet Take 50 mg by mouth at bedtime. (Patient not taking: Reported on 08/07/2020)     No current facility-administered medications on file prior to visit.    There are no Patient Instructions on file for this visit. No follow-ups on file.   Kris Hartmann, NP

## 2021-03-04 ENCOUNTER — Other Ambulatory Visit: Payer: Self-pay

## 2021-03-04 ENCOUNTER — Emergency Department
Admission: EM | Admit: 2021-03-04 | Discharge: 2021-03-04 | Disposition: A | Discharge status: Home / Self Care | Payer: Medicare Other | Attending: Emergency Medicine | Admitting: Emergency Medicine

## 2021-03-04 DIAGNOSIS — Z8546 Personal history of malignant neoplasm of prostate: Secondary | ICD-10-CM | POA: Insufficient documentation

## 2021-03-04 DIAGNOSIS — I130 Hypertensive heart and chronic kidney disease with heart failure and stage 1 through stage 4 chronic kidney disease, or unspecified chronic kidney disease: Secondary | ICD-10-CM | POA: Diagnosis not present

## 2021-03-04 DIAGNOSIS — Z96651 Presence of right artificial knee joint: Secondary | ICD-10-CM | POA: Insufficient documentation

## 2021-03-04 DIAGNOSIS — R42 Dizziness and giddiness: Secondary | ICD-10-CM | POA: Insufficient documentation

## 2021-03-04 DIAGNOSIS — Z7982 Long term (current) use of aspirin: Secondary | ICD-10-CM | POA: Diagnosis not present

## 2021-03-04 DIAGNOSIS — Z96643 Presence of artificial hip joint, bilateral: Secondary | ICD-10-CM | POA: Insufficient documentation

## 2021-03-04 DIAGNOSIS — F0390 Unspecified dementia without behavioral disturbance: Secondary | ICD-10-CM | POA: Diagnosis not present

## 2021-03-04 DIAGNOSIS — Z79899 Other long term (current) drug therapy: Secondary | ICD-10-CM | POA: Diagnosis not present

## 2021-03-04 DIAGNOSIS — N183 Chronic kidney disease, stage 3 unspecified: Secondary | ICD-10-CM | POA: Diagnosis not present

## 2021-03-04 DIAGNOSIS — I2510 Atherosclerotic heart disease of native coronary artery without angina pectoris: Secondary | ICD-10-CM | POA: Diagnosis not present

## 2021-03-04 DIAGNOSIS — I5032 Chronic diastolic (congestive) heart failure: Secondary | ICD-10-CM | POA: Insufficient documentation

## 2021-03-04 DIAGNOSIS — Z87891 Personal history of nicotine dependence: Secondary | ICD-10-CM | POA: Insufficient documentation

## 2021-03-04 LAB — CBC WITH DIFFERENTIAL/PLATELET
Abs Immature Granulocytes: 0.03 10*3/uL (ref 0.00–0.07)
Basophils Absolute: 0 10*3/uL (ref 0.0–0.1)
Basophils Relative: 0 %
Eosinophils Absolute: 0 10*3/uL (ref 0.0–0.5)
Eosinophils Relative: 0 %
HCT: 40.4 % (ref 39.0–52.0)
Hemoglobin: 13.4 g/dL (ref 13.0–17.0)
Immature Granulocytes: 0 %
Lymphocytes Relative: 10 %
Lymphs Abs: 1.2 10*3/uL (ref 0.7–4.0)
MCH: 30.6 pg (ref 26.0–34.0)
MCHC: 33.2 g/dL (ref 30.0–36.0)
MCV: 92.2 fL (ref 80.0–100.0)
Monocytes Absolute: 1 10*3/uL (ref 0.1–1.0)
Monocytes Relative: 9 %
Neutro Abs: 9 10*3/uL — ABNORMAL HIGH (ref 1.7–7.7)
Neutrophils Relative %: 81 %
Platelets: 286 10*3/uL (ref 150–400)
RBC: 4.38 MIL/uL (ref 4.22–5.81)
RDW: 15.3 % (ref 11.5–15.5)
WBC: 11.2 10*3/uL — ABNORMAL HIGH (ref 4.0–10.5)
nRBC: 0 % (ref 0.0–0.2)

## 2021-03-04 LAB — BASIC METABOLIC PANEL
Anion gap: 7 (ref 5–15)
BUN: 16 mg/dL (ref 8–23)
CO2: 26 mmol/L (ref 22–32)
Calcium: 8.6 mg/dL — ABNORMAL LOW (ref 8.9–10.3)
Chloride: 105 mmol/L (ref 98–111)
Creatinine, Ser: 1.21 mg/dL (ref 0.61–1.24)
GFR, Estimated: 58 mL/min — ABNORMAL LOW (ref >60)
Glucose, Bld: 91 mg/dL (ref 70–99)
Potassium: 4.3 mmol/L (ref 3.5–5.1)
Sodium: 138 mmol/L (ref 135–145)

## 2021-03-04 LAB — TROPONIN I (HIGH SENSITIVITY)
Troponin I (High Sensitivity): 48 ng/L — ABNORMAL HIGH (ref <18)
Troponin I (High Sensitivity): 33 ng/L — ABNORMAL HIGH (ref <18)

## 2021-03-04 NOTE — ED Notes (Signed)
Report to [place] from [facility]

## 2021-03-04 NOTE — ED Provider Notes (Signed)
[facility] Emergency Department Provider Note   ____________________________________________   Event Date/Time   First MD Initiated Contact with Patient 03/04/21 1138     (approximate)  I have reviewed the triage vital signs and the nursing notes.   HISTORY  Chief Complaint Dizziness    HPI Kyle Whitaker is a 87 y.o. male with past medical history of dementia, hypertension, hyperlipidemia, CAD, CHF, CKD, and peripheral vascular disease who presents to the ED complaining of dizziness.  Patient reports that he was feeling dizzy earlier today with a sensation that his head was spinning and "everything was off."  Patient now stating that "I am feeling better and better" and denies any ongoing dizziness.  He denies any chest pain or shortness of breath today, states he has been feeling well with no fevers, cough, abdominal pain, nausea, vomiting, dysuria, or hematuria.  Patient had called EMS himself to the [facility], where he resides.        Past Medical History:  Diagnosis Date   Anxiety    Arthritis    Cancer ([place]) 2005   Prostate   CHF (congestive heart failure) ([facility])    Coronary artery disease    Hyperlipidemia    Hypertension    Peripheral vascular disease ([place])    carotid stenosis   Shortness of breath dyspnea    on exertion    Patient Active Problem List   Diagnosis Date Noted   Benign hypertension with CKD (chronic kidney disease) stage III ([place]) 08/07/2020   Resides in skilled nursing facility 09/16/2018   Essential hypertension 02/17/2018   Healthcare maintenance 09/15/2017   Dementia arising in the senium and presenium ([place]) 07/21/2017   Acute renal failure (ARF) ([place]) 07/18/2017   ARF (acute renal failure) ([place]) 07/16/2017   Chest pain 06/06/2017   Chronic prescription benzodiazepine use 05/18/2017   H/O non-ST elevation myocardial infarction (NSTEMI) 05/18/2017   History of alcohol abuse 05/18/2017    Hyperlipidemia, unspecified 05/18/2017   Prostate cancer ([place]) 05/18/2017   Hypertension 05/18/2017   Hallucinations 09/19/2016   Mild dementia ([place]) 09/19/2016   Spells of formed visual hallucinations 09/19/2016   Carotid stenosis 07/17/2016   Shortness of breath 06/28/2016   Carotid artery stenosis    Chronic coronary artery disease    Carotid artery calcification, left 06/19/2016   Left-sided carotid artery disease ([place]) 06/19/2016   Primary osteoarthritis of left hip 10/29/2015   Chronic diastolic CHF (congestive heart failure) ([place]) 03/15/2015    Past Surgical History:  Procedure Laterality Date   CATARACT EXTRACTION W/PHACO Right 12/10/2016   Procedure: CATARACT EXTRACTION PHACO AND INTRAOCULAR LENS PLACEMENT (IOC);  Surgeon: Eulogio Bear, MD;  Location: [facility] ORS;  Service: Ophthalmology;  Laterality: Right;  Lot #[id number] H US:01:31.2 AP%:18.4 CDE:17.30   CATARACT EXTRACTION W/PHACO Left 01/07/2017   Procedure: CATARACT EXTRACTION PHACO AND INTRAOCULAR LENS PLACEMENT ([place]);  Surgeon: Eulogio Bear, MD;  Location: [facility] ORS;  Service: Ophthalmology;  Laterality: Left;  Lot# [id number] H [place]: 01:05.8 AP%: 16.3 CDE: 10.72   ENDARTERECTOMY Right 07/17/2016   Procedure: ENDARTERECTOMY CAROTID;  Surgeon: Katha Cabal, MD;  Location: [facility] ORS;  Service: Vascular;  Laterality: Right;   JOINT REPLACEMENT Right 2011   Total Hip Replacement, [place]   JOINT REPLACEMENT Right 2011   Total Knee Replacement, [facility]   JOINT REPLACEMENT Left 2015   total hip replacement   KNEE ARTHROSCOPY Right 2011   PROSTATE SURGERY     TOTAL HIP ARTHROPLASTY Left 10/29/2015  Procedure: TOTAL HIP ARTHROPLASTY ANTERIOR APPROACH;  Surgeon: Hessie Knows, MD;  Location: [facility] ORS;  Service: Orthopedics;  Laterality: Left;    Prior to Admission medications   Medication Sig Start Date End Date Taking? Authorizing Provider  amLODipine (NORVASC) 10 MG tablet  07/19/20   [provider]  amLODipine (NORVASC) 5 MG tablet Take 1 tablet (5 mg total) by mouth daily. Patient not taking: Reported on 08/07/2020 09/21/16   Gladstone Lighter, MD  ARIPiprazole (ABILIFY) 10 MG tablet Take by mouth. Patient not taking: Reported on 08/07/2020    [provider]  aspirin EC 81 MG EC tablet Take 1 tablet (81 mg total) by mouth daily. 07/18/16   Algernon Huxley, MD  atenolol (TENORMIN) 25 MG tablet Take 25 mg by mouth daily.    [provider]  clonazePAM (KLONOPIN) 0.5 MG tablet Take by mouth. Patient not taking: Reported on 08/07/2020    [provider]  cyanocobalamin (,VITAMIN B-12,) 1000 MCG/ML injection Inject into the muscle.    [provider]  diazepam (VALIUM) 2 MG tablet Take 1 tablet (2 mg total) by mouth 2 (two) times daily. Patient not taking: Reported on 08/07/2020 07/20/17   Epifanio Lesches, MD  diazepam (VALIUM) 2 MG tablet Take 1 tablet by mouth 2 (two) times daily. Patient not taking: Reported on 08/07/2020 06/28/18   [provider]  DIPHENHIST 25 MG capsule Take by mouth. 04/22/20   [provider]  donepezil (ARICEPT) 10 MG tablet Take 10 mg by mouth at bedtime.    [provider]  EQL ANTACID/ANTI-GAS 200-200-20 MG/5ML suspension Take by mouth. 04/22/20   [provider]  Eszopiclone 3 MG TABS Take by mouth. Patient not taking: Reported on 08/07/2020    [provider]  feeding supplement, ENSURE ENLIVE, (ENSURE ENLIVE) LIQD Take 237 mLs by mouth 3 (three) times daily between meals. 07/20/17   Epifanio Lesches, MD  folic acid (FOLVITE) 1 MG tablet Take 1 mg by mouth daily. Patient not taking: Reported on 08/07/2020    [provider]  guaiFENesin (ROBITUSSIN) 100 MG/5ML liquid Take by mouth. 04/22/20   [provider]  hydrochlorothiazide (HYDRODIURIL) 25 MG tablet Take 25 mg by mouth daily. Patient not taking: Reported on 08/07/2020    [provider]   hydrOXYzine (VISTARIL) 25 MG capsule Take by mouth. 04/22/20   [provider]  lisinopril-hydrochlorothiazide (PRINZIDE,ZESTORETIC) 20-25 MG tablet Take 1 tablet by mouth daily. Patient not taking: Reported on 08/07/2020 02/07/18   [provider]  lithium citrate 300 MG/5ML solution Take by mouth. Patient not taking: Reported on 08/07/2020    [provider]  LOPERAMIDE A-D 2 MG tablet 2 mg elemental calcium/kg/hr. 04/22/20   [provider]  Magnesium Oxide 500 MG TABS Take 500 mg by mouth at bedtime. Patient not taking: Reported on 08/07/2020    [provider]  Melatonin 3 MG TABS Take by mouth. Patient not taking: Reported on 08/07/2020    [provider]  memantine (NAMENDA) 10 MG tablet Take 10 mg by mouth at bedtime. Patient not taking: Reported on 08/07/2020    [provider]  memantine [facility]) 5 MG tablet  05/25/20   [provider]  MILK OF MAGNESIA 400 MG/5ML suspension Take by mouth. 04/22/20   [provider]  mirtazapine (REMERON) 30 MG tablet Take 30 mg by mouth at bedtime. Patient not taking: Reported on 08/07/2020    [provider]  Multiple Vitamin (MULTIVITAMIN WITH  MINERALS) TABS tablet Take 1 tablet by mouth daily. 07/20/17   Epifanio Lesches, MD  prednisoLONE acetate (PRED FORTE) 1 % ophthalmic suspension Administer 1 drop to both eyes Four (4) times a day. Patient not taking: Reported on 08/07/2020 03/11/17   [provider]  pyridOXINE (B-6) 50 MG tablet Take by mouth. Patient not taking: Reported on 08/07/2020    [provider]  risperiDONE (RISPERDAL M-TABS) 0.5 MG disintegrating tablet Take 1 tablet (0.5 mg total) by mouth at bedtime. Patient not taking: Reported on 08/07/2020 09/20/16   Gladstone Lighter, MD  risperiDONE (RISPERDAL) 0.5 MG tablet Take by mouth. Patient not taking: Reported on 08/07/2020 09/23/16   [provider]  thiamine 100 MG tablet  Take 1 tablet (100 mg total) by mouth daily. Patient not taking: Reported on 08/07/2020 07/20/17   Epifanio Lesches, MD  Vitamin D, Ergocalciferol, (DRISDOL) 50000 units CAPS capsule Take 50,000 Units by mouth once a week. Fridays Patient not taking: Reported on 08/07/2020    [provider]  zinc gluconate 50 MG tablet Take 50 mg by mouth at bedtime. Patient not taking: Reported on 08/07/2020    [provider]    Allergies Vicodin [hydrocodone-acetaminophen] and Penicillins  Family History  Problem Relation Age of Onset   CAD Mother     Social History Social History   Tobacco Use   Smoking status: Former Smoker    Packs/day: 1.00    Types: Cigarettes   Smokeless tobacco: Never Used   Tobacco comment: quit 50 years ago  Substance Use Topics   Alcohol use: No   Drug use: No    Review of Systems  Constitutional: No fever/chills.  Positive for dizziness. Eyes: No visual changes. ENT: No sore throat. Cardiovascular: Denies chest pain. Respiratory: Denies shortness of breath. Gastrointestinal: No abdominal pain.  No nausea, no vomiting.  No diarrhea.  No constipation. Genitourinary: Negative for dysuria. Musculoskeletal: Negative for back pain. Skin: Negative for rash. Neurological: Negative for headaches, focal weakness or numbness.  ____________________________________________   PHYSICAL EXAM:  VITAL SIGNS: ED Triage Vitals  Enc Vitals Group     BP 03/04/21 1132 (!) 157/80     Pulse Rate 03/04/21 1132 62     Resp 03/04/21 1132 19     Temp 03/04/21 1132 98.7 F (37.1 C)     Temp Source 03/04/21 1132 Oral     SpO2 03/04/21 1132 97 %     Weight 03/04/21 1134 186 lb 1.1 oz (84.4 kg)     Height 03/04/21 1134 5\' 9"  (1.753 m)     Head Circumference --      Peak Flow --      Pain Score 03/04/21 1133 0     Pain Loc --      Pain Edu? --      Excl. in [place]? --     Constitutional: Awake and alert. Eyes: Conjunctivae are normal. Head:  Atraumatic. Nose: No congestion/rhinnorhea. Mouth/Throat: Mucous membranes are moist. Neck: Normal ROM Cardiovascular: Normal rate, regular rhythm. Grossly normal heart sounds. Respiratory: Normal respiratory effort.  No retractions. Lungs CTAB. Gastrointestinal: Soft and nontender. No distention. Genitourinary: deferred Musculoskeletal: No lower extremity tenderness nor edema. Neurologic:  Normal speech and language. No gross focal neurologic deficits are appreciated. Skin:  Skin is warm, dry and intact. No rash noted. Psychiatric: Mood and affect are normal. Speech and behavior are normal.  ____________________________________________   LABS (all labs ordered are listed, but only abnormal results are displayed)  Labs Reviewed  CBC WITH DIFFERENTIAL/PLATELET - Abnormal; Notable for the following components:      Result Value   WBC 11.2 (*)    Neutro Abs 9.0 (*)    All other components within normal limits  BASIC METABOLIC PANEL - Abnormal; Notable for the following components:   Calcium 8.6 (*)    GFR, Estimated 58 (*)    All other components within normal limits  TROPONIN I (HIGH SENSITIVITY) - Abnormal; Notable for the following components:   Troponin I (High Sensitivity) 48 (*)    All other components within normal limits  TROPONIN I (HIGH SENSITIVITY)   ____________________________________________  EKG  ED ECG REPORT I, Blake Divine, the attending physician, personally viewed and interpreted this ECG.   Date: 03/04/2021  EKG Time: 11:45  Rate: 60  Rhythm: normal sinus rhythm  Axis: LAD  Intervals:left anterior fascicular block  ST&T Change: None   PROCEDURES  Procedure(s) performed (including Critical Care):  Procedures   ____________________________________________   INITIAL IMPRESSION / ASSESSMENT AND PLAN / ED COURSE       87 year old male with past medical history of dementia, hypertension, hyperlipidemia, CAD, CHF, peripheral vascular  disease, and CKD who presents to the ED for episode of dizziness at his nursing facility.  All of his symptoms seem to have resolved and he has no focal neurologic deficits on exam, low suspicion for stroke.  EKG shows no evidence of arrhythmia or ischemia, we will screen labs including troponin, but he denies any chest pain or shortness of breath.  If work-up is unremarkable, patient will be appropriate for discharge back to nursing facility.  Labs remarkable for mild chronic kidney disease, similar to previous.  Patient does have mildly elevated troponin but continues to be asymptomatic here in the ED.  We will trend second set troponin but if this is stable he would be appropriate for discharge back to his nursing facility.  Patient turned over to oncoming provider pending repeat troponin.      ____________________________________________   FINAL CLINICAL IMPRESSION(S) / ED DIAGNOSES  Final diagnoses:  Dizziness     ED Discharge Orders    None       Note:  This document was prepared using Dragon voice recognition software and may include unintentional dictation errors.   Blake Divine, MD 03/04/21 [phone]

## 2021-03-04 NOTE — ED Notes (Signed)
This tech assisted pt to the toilet via wheelchair.

## 2021-03-04 NOTE — ED Notes (Signed)
This tech provided peri care to pt. Dry brief and blue pants were given to pt.

## 2021-03-04 NOTE — ED Notes (Signed)
Report to EMS. NAD noted. VSS.

## 2021-03-04 NOTE — ED Notes (Signed)
Pt presents to ED via EMS from "[facility]" and EMS states called himself for dizziness. Pt denies N/V/D , pt denies fevers chills and pt denies chest pain to this RN. Pt now states "I feel better". Initial EMS call was for dizziness. Pt know denies this. Pt has a HX of dementia. NAD noted. Pt denies pain anywhere at this time. EKG obtained. IV established by EMS, blood work sent.

## 2021-03-04 NOTE — ED Triage Notes (Signed)
Pt arrives via EMS from assisted facility, [facility], due to dizziness.

## 2021-03-17 ENCOUNTER — Emergency Department: Payer: Medicare Other

## 2021-03-17 ENCOUNTER — Emergency Department
Admission: EM | Admit: 2021-03-17 | Discharge: 2021-03-17 | Disposition: A | Discharge status: Home / Self Care | Payer: Medicare Other | Attending: Emergency Medicine | Admitting: Emergency Medicine

## 2021-03-17 ENCOUNTER — Other Ambulatory Visit: Payer: Self-pay

## 2021-03-17 DIAGNOSIS — F0390 Unspecified dementia without behavioral disturbance: Secondary | ICD-10-CM | POA: Insufficient documentation

## 2021-03-17 DIAGNOSIS — N183 Chronic kidney disease, stage 3 unspecified: Secondary | ICD-10-CM | POA: Insufficient documentation

## 2021-03-17 DIAGNOSIS — Z7982 Long term (current) use of aspirin: Secondary | ICD-10-CM | POA: Insufficient documentation

## 2021-03-17 DIAGNOSIS — Z96643 Presence of artificial hip joint, bilateral: Secondary | ICD-10-CM | POA: Diagnosis not present

## 2021-03-17 DIAGNOSIS — Z79899 Other long term (current) drug therapy: Secondary | ICD-10-CM | POA: Diagnosis not present

## 2021-03-17 DIAGNOSIS — R519 Headache, unspecified: Secondary | ICD-10-CM | POA: Diagnosis present

## 2021-03-17 DIAGNOSIS — H5710 Ocular pain, unspecified eye: Secondary | ICD-10-CM | POA: Diagnosis not present

## 2021-03-17 DIAGNOSIS — I2510 Atherosclerotic heart disease of native coronary artery without angina pectoris: Secondary | ICD-10-CM | POA: Diagnosis not present

## 2021-03-17 DIAGNOSIS — I130 Hypertensive heart and chronic kidney disease with heart failure and stage 1 through stage 4 chronic kidney disease, or unspecified chronic kidney disease: Secondary | ICD-10-CM | POA: Diagnosis not present

## 2021-03-17 DIAGNOSIS — Z8546 Personal history of malignant neoplasm of prostate: Secondary | ICD-10-CM | POA: Insufficient documentation

## 2021-03-17 DIAGNOSIS — I5032 Chronic diastolic (congestive) heart failure: Secondary | ICD-10-CM | POA: Insufficient documentation

## 2021-03-17 DIAGNOSIS — Z87891 Personal history of nicotine dependence: Secondary | ICD-10-CM | POA: Diagnosis not present

## 2021-03-17 LAB — BASIC METABOLIC PANEL
Anion gap: 7 (ref 5–15)
BUN: 14 mg/dL (ref 8–23)
CO2: 27 mmol/L (ref 22–32)
Calcium: 9 mg/dL (ref 8.9–10.3)
Chloride: 105 mmol/L (ref 98–111)
Creatinine, Ser: 1.23 mg/dL (ref 0.61–1.24)
GFR, Estimated: 56 mL/min — ABNORMAL LOW (ref >60)
Glucose, Bld: 100 mg/dL — ABNORMAL HIGH (ref 70–99)
Potassium: 4 mmol/L (ref 3.5–5.1)
Sodium: 139 mmol/L (ref 135–145)

## 2021-03-17 LAB — CBC
HCT: 40.1 % (ref 39.0–52.0)
Hemoglobin: 13.2 g/dL (ref 13.0–17.0)
MCH: 30.5 pg (ref 26.0–34.0)
MCHC: 32.9 g/dL (ref 30.0–36.0)
MCV: 92.6 fL (ref 80.0–100.0)
Platelets: 230 10*3/uL (ref 150–400)
RBC: 4.33 MIL/uL (ref 4.22–5.81)
RDW: 15.4 % (ref 11.5–15.5)
WBC: 8.6 10*3/uL (ref 4.0–10.5)
nRBC: 0 % (ref 0.0–0.2)

## 2021-03-17 NOTE — ED Triage Notes (Addendum)
Pt comes via EMS from the [facility] with c/o headache and eye pain. Pt recently seen for same and evaluated. Pt does have hx of dementia.  Pt states some dizziness. Pt prescribed medication for eye and using as directed.

## 2021-03-17 NOTE — ED Notes (Signed)
Called [facility] to transport patient to [facility]

## 2021-03-17 NOTE — ED Provider Notes (Signed)
[facility] Emergency Department Provider Note  ____________________________________________   Event Date/Time   First MD Initiated Contact with Patient 03/17/21 1332     (approximate)  I have reviewed the triage vital signs and the nursing notes.   HISTORY  Chief Complaint No chief complaint on file.    HPI Kyle Whitaker is a 87 y.o. male with history of dementia here with reported headache.  History is limited due to dementia.  Per report, the patient has been complaining of intermittent headache as well as eye pain, though it is somewhat unclear which evaluate.  Has not had any falls.  On my assessment, the patient denies any overt headache or eye pain.  He denies any vision changes.  Denies any numbness or weakness.  No other complaints.  Level 5 caveat invoked as remainder of history, ROS, and physical exam limited due to patient's dementia.         Past Medical History:  Diagnosis Date  . Anxiety   . Arthritis   . Cancer [facility]) 2005   Prostate  . CHF (congestive heart failure) ([place])   . Coronary artery disease   . Hyperlipidemia   . Hypertension   . Peripheral vascular disease ([place])    carotid stenosis  . Shortness of breath dyspnea    on exertion    Patient Active Problem List   Diagnosis Date Noted  . Benign hypertension with CKD (chronic kidney disease) stage III ([place]) 08/07/2020  . Resides in skilled nursing facility 09/16/2018  . Essential hypertension 02/17/2018  . Healthcare maintenance 09/15/2017  . Dementia arising in the senium and presenium ([place]) 07/21/2017  . Acute renal failure (ARF) ([place]) 07/18/2017  . ARF (acute renal failure) ([place]) 07/16/2017  . Chest pain 06/06/2017  . Chronic prescription benzodiazepine use 05/18/2017  . H/O non-ST elevation myocardial infarction (NSTEMI) 05/18/2017  . History of alcohol abuse 05/18/2017  . Hyperlipidemia, unspecified 05/18/2017  . Prostate cancer ([place]) 05/18/2017  .  Hypertension 05/18/2017  . Hallucinations 09/19/2016  . Mild dementia ([place]) 09/19/2016  . Spells of formed visual hallucinations 09/19/2016  . Carotid stenosis 07/17/2016  . Shortness of breath 06/28/2016  . Carotid artery stenosis   . Chronic coronary artery disease   . Carotid artery calcification, left 06/19/2016  . Left-sided carotid artery disease ([place]) 06/19/2016  . Primary osteoarthritis of left hip 10/29/2015  . Chronic diastolic CHF (congestive heart failure) ([place]) 03/15/2015    Past Surgical History:  Procedure Laterality Date  . CATARACT EXTRACTION W/PHACO Right 12/10/2016   Procedure: CATARACT EXTRACTION PHACO AND INTRAOCULAR LENS PLACEMENT (IOC);  Surgeon: Eulogio Bear, MD;  Location: [facility] ORS;  Service: Ophthalmology;  Laterality: Right;  Lot #[id number] H US:01:31.2 AP%:18.4 CDE:17.30  . CATARACT EXTRACTION W/PHACO Left 01/07/2017   Procedure: CATARACT EXTRACTION PHACO AND INTRAOCULAR LENS PLACEMENT (IOC);  Surgeon: Eulogio Bear, MD;  Location: [facility] ORS;  Service: Ophthalmology;  Laterality: Left;  Lot# [id number] H [place]: 01:05.8 AP%: 16.3 CDE: 10.72  . ENDARTERECTOMY Right 07/17/2016   Procedure: ENDARTERECTOMY CAROTID;  Surgeon: Katha Cabal, MD;  Location: [facility] ORS;  Service: Vascular;  Laterality: Right;  . JOINT REPLACEMENT Right 2011   Total Hip Replacement, [facility]  . JOINT REPLACEMENT Right 2011   Total Knee Replacement, [facility]  . JOINT REPLACEMENT Left 2015   total hip replacement  . KNEE ARTHROSCOPY Right 2011  . PROSTATE SURGERY    . TOTAL HIP ARTHROPLASTY Left 10/29/2015   Procedure: TOTAL HIP ARTHROPLASTY ANTERIOR APPROACH;  Surgeon: Hessie Knows, MD;  Location: [facility] ORS;  Service: Orthopedics;  Laterality: Left;    Prior to Admission medications   Medication Sig Start Date End Date Taking? Authorizing Provider  amLODipine (NORVASC) 10 MG tablet  07/19/20   [provider]  amLODipine (NORVASC) 5 MG tablet Take 1 tablet (5 mg total) by mouth  daily. Patient not taking: Reported on 08/07/2020 09/21/16   Gladstone Lighter, MD  ARIPiprazole (ABILIFY) 10 MG tablet Take by mouth. Patient not taking: Reported on 08/07/2020    [provider]  aspirin EC 81 MG EC tablet Take 1 tablet (81 mg total) by mouth daily. 07/18/16   Algernon Huxley, MD  atenolol (TENORMIN) 25 MG tablet Take 25 mg by mouth daily.    [provider]  clonazePAM (KLONOPIN) 0.5 MG tablet Take by mouth. Patient not taking: Reported on 08/07/2020    [provider]  cyanocobalamin (,VITAMIN B-12,) 1000 MCG/ML injection Inject into the muscle.    [provider]  diazepam (VALIUM) 2 MG tablet Take 1 tablet (2 mg total) by mouth 2 (two) times daily. Patient not taking: Reported on 08/07/2020 07/20/17   Epifanio Lesches, MD  diazepam (VALIUM) 2 MG tablet Take 1 tablet by mouth 2 (two) times daily. Patient not taking: Reported on 08/07/2020 06/28/18   [provider]  DIPHENHIST 25 MG capsule Take by mouth. 04/22/20   [provider]  donepezil (ARICEPT) 10 MG tablet Take 10 mg by mouth at bedtime.    [provider]  EQL ANTACID/ANTI-GAS 200-200-20 MG/5ML suspension Take by mouth. 04/22/20   [provider]  Eszopiclone 3 MG TABS Take by mouth. Patient not taking: Reported on 08/07/2020    [provider]  feeding supplement, ENSURE ENLIVE, (ENSURE ENLIVE) LIQD Take 237 mLs by mouth 3 (three) times daily between meals. 07/20/17   Epifanio Lesches, MD  folic acid (FOLVITE) 1 MG tablet Take 1 mg by mouth daily. Patient not taking: Reported on 08/07/2020    [provider]  guaiFENesin (ROBITUSSIN) 100 MG/5ML liquid Take by mouth. 04/22/20   [provider]  hydrochlorothiazide (HYDRODIURIL) 25 MG tablet Take 25 mg by mouth daily. Patient not taking: Reported on 08/07/2020    [provider]  hydrOXYzine (VISTARIL) 25 MG capsule Take by mouth. 04/22/20   [provider]   lisinopril-hydrochlorothiazide (PRINZIDE,ZESTORETIC) 20-25 MG tablet Take 1 tablet by mouth daily. Patient not taking: Reported on 08/07/2020 02/07/18   [provider]  lithium citrate 300 MG/5ML solution Take by mouth. Patient not taking: Reported on 08/07/2020    [provider]  LOPERAMIDE A-D 2 MG tablet 2 mg elemental calcium/kg/hr. 04/22/20   [provider]  Magnesium Oxide 500 MG TABS Take 500 mg by mouth at bedtime. Patient not taking: Reported on 08/07/2020    [provider]  Melatonin 3 MG TABS Take by mouth. Patient not taking: Reported on 08/07/2020    [provider]  memantine (NAMENDA) 10 MG tablet Take 10 mg by mouth at bedtime. Patient not taking: Reported on 08/07/2020    [provider]  memantine [facility]) 5 MG tablet  05/25/20   [provider]  MILK OF MAGNESIA 400 MG/5ML suspension Take by mouth. 04/22/20   [provider]  mirtazapine (REMERON) 30 MG tablet Take 30 mg by mouth at bedtime. Patient not taking: Reported on 08/07/2020    [provider]  Multiple Vitamin (MULTIVITAMIN WITH MINERALS) TABS tablet Take 1 tablet by  mouth daily. 07/20/17   Epifanio Lesches, MD  prednisoLONE acetate (PRED FORTE) 1 % ophthalmic suspension Administer 1 drop to both eyes Four (4) times a day. Patient not taking: Reported on 08/07/2020 03/11/17   [provider]  pyridOXINE (B-6) 50 MG tablet Take by mouth. Patient not taking: Reported on 08/07/2020    [provider]  risperiDONE (RISPERDAL M-TABS) 0.5 MG disintegrating tablet Take 1 tablet (0.5 mg total) by mouth at bedtime. Patient not taking: Reported on 08/07/2020 09/20/16   Gladstone Lighter, MD  risperiDONE (RISPERDAL) 0.5 MG tablet Take by mouth. Patient not taking: Reported on 08/07/2020 09/23/16   [provider]  thiamine 100 MG tablet Take 1 tablet (100 mg total) by mouth daily. Patient not taking: Reported on 08/07/2020  07/20/17   Epifanio Lesches, MD  Vitamin D, Ergocalciferol, (DRISDOL) 50000 units CAPS capsule Take 50,000 Units by mouth once a week. Fridays Patient not taking: Reported on 08/07/2020    [provider]  zinc gluconate 50 MG tablet Take 50 mg by mouth at bedtime. Patient not taking: Reported on 08/07/2020    [provider]    Allergies Vicodin [hydrocodone-acetaminophen] and Penicillins  Family History  Problem Relation Age of Onset  . CAD Mother     Social History Social History   Tobacco Use  . Smoking status: Former Smoker    Packs/day: 1.00    Types: Cigarettes  . Smokeless tobacco: Never Used  . Tobacco comment: quit 50 years ago  Substance Use Topics  . Alcohol use: No  . Drug use: No    Review of Systems  Review of Systems  Unable to perform ROS: Dementia     ____________________________________________  PHYSICAL EXAM:      VITAL SIGNS: ED Triage Vitals  Enc Vitals Group     BP 03/17/21 1129 139/82     Pulse Rate 03/17/21 1129 61     Resp 03/17/21 1129 17     Temp 03/17/21 1129 98.1 F (36.7 C)     Temp Source 03/17/21 1129 Oral     SpO2 03/17/21 1129 98 %     Weight --      Height --      Head Circumference --      Peak Flow --      Pain Score 03/17/21 1121 6     Pain Loc --      Pain Edu? --      Excl. in [place]? --      Physical Exam Vitals and nursing note reviewed.  Constitutional:      General: He is not in acute distress.    Appearance: He is well-developed.  HENT:     Head: Normocephalic and atraumatic.  Eyes:     Conjunctiva/sclera: Conjunctivae normal.     Comments: Pupils symmetric, round, reactive bilaterally.  No conjunctival injection or erythema.  No proptosis.  No periorbital erythema.  Extraocular movements are full and intact.  Globes intact and soft bilaterally.  Cardiovascular:     Rate and Rhythm: Normal rate and regular rhythm.     Heart sounds: Normal heart sounds.  Pulmonary:     Effort:  Pulmonary effort is normal. No respiratory distress.     Breath sounds: No wheezing.  Abdominal:     General: There is no distension.  Musculoskeletal:     Cervical back: Neck supple.  Skin:    General: Skin is warm.     Capillary Refill: Capillary refill takes less  than 2 seconds.     Findings: No rash.  Neurological:     Mental Status: He is alert. He is disoriented.     Motor: No abnormal muscle tone.     Comments: Oriented to person but not place or time which appears to be his baseline.  Moves all extremities with 5 and 5 strength.  Normal sensation to light touch. No overt CN deficits.       ____________________________________________   LABS (all labs ordered are listed, but only abnormal results are displayed)  Labs Reviewed  BASIC METABOLIC PANEL - Abnormal; Notable for the following components:      Result Value   Glucose, Bld 100 (*)    GFR, Estimated 56 (*)    All other components within normal limits  CBC  CBG MONITORING, ED    ____________________________________________  EKG: Normal sinus rhythm with PVCs.  Ventricular rate 64, PR 176, QRS 118, QTc 451.  LVH.  No acute ischemic changes ________________________________________  RADIOLOGY All imaging, including plain films, CT scans, and ultrasounds, independently reviewed by me, and interpretations confirmed via formal radiology reads.  ED MD interpretation:   CT head: No acute abnormality  Official radiology report(s): CT Head Wo Contrast  Result Date: 03/17/2021 CLINICAL DATA:  Headache and eye pain. EXAM: CT HEAD WITHOUT CONTRAST TECHNIQUE: Contiguous axial images were obtained from the base of the skull through the vertex without intravenous contrast. COMPARISON:  MRI brain and CT head dated September 19, 2016. FINDINGS: Brain: No evidence of acute infarction, hemorrhage, hydrocephalus, extra-axial collection or mass lesion/mass effect. Relatively stable atrophy and mild chronic microvascular ischemic  changes. Multiple small chronic lacunar infarcts in the left cerebellum again noted. Vascular: Calcified atherosclerosis at the skullbase. No hyperdense vessel. Skull: Normal. Negative for fracture or focal lesion. Sinuses/Orbits: No acute finding. Other: None. IMPRESSION: 1. No acute intracranial abnormality. Electronically Signed   By: Titus Dubin M.D.   On: 03/17/2021 14:28    ____________________________________________  PROCEDURES   Procedure(s) performed (including Critical Care):  Procedures  ____________________________________________  INITIAL IMPRESSION / MDM / [place] / ED COURSE  As part of my medical decision making, I reviewed the following data within the [facility] notes reviewed and incorporated, Old chart reviewed, Notes from prior ED visits, and [place] Controlled Substance [place] was evaluated in Emergency Department on 03/17/2021 for the symptoms described in the history of present illness. He was evaluated in the context of the global COVID-19 pandemic, which necessitated consideration that the patient might be at risk for infection with the SARS-CoV-2 virus that causes COVID-19. Institutional protocols and algorithms that pertain to the evaluation of patients at risk for COVID-19 are in a state of rapid change based on information released by regulatory bodies including the CDC and federal and state organizations. These policies and algorithms were followed during the patient's care in the ED.  Some ED evaluations and interventions may be delayed as a result of limited staffing during the pandemic.*     Medical Decision Making: 87 year old male here with reported headache and eye pain.  On exam, patient has no obvious head or eye abnormality.  His vision appears to be intact, extraocular movements are intact, no conjunctival erythema or injection, no eye pain, or other evidence to suggest acute pathology.  He  has no evidence of pre or post septal cellulitis.  No evidence of glaucoma clinically.  Globes are soft  and intact bilaterally.  Regarding his headache, this has been a somewhat recurrent issue per review of records.  CT head shows no acute normality.  He is adamant he has no headache on my assessment.  Serial neurological exams are at his baseline.  Lab work is reassuring.  Will discharge with continued supportive care and good return precautions.  ____________________________________________  FINAL CLINICAL IMPRESSION(S) / ED DIAGNOSES  Final diagnoses:  Acute nonintractable headache, unspecified headache type     MEDICATIONS GIVEN DURING THIS VISIT:  Medications - No data to display   ED Discharge Orders    None       Note:  This document was prepared using Dragon voice recognition software and may include unintentional dictation errors.   Duffy Bruce, MD 03/17/21 1535

## 2021-04-22 ENCOUNTER — Emergency Department: Payer: Medicare Other

## 2021-04-22 ENCOUNTER — Emergency Department
Admission: EM | Admit: 2021-04-22 | Discharge: 2021-04-22 | Disposition: A | Discharge status: Home / Self Care | Payer: Medicare Other | Attending: Emergency Medicine | Admitting: Emergency Medicine

## 2021-04-22 DIAGNOSIS — I5032 Chronic diastolic (congestive) heart failure: Secondary | ICD-10-CM | POA: Insufficient documentation

## 2021-04-22 DIAGNOSIS — Z96651 Presence of right artificial knee joint: Secondary | ICD-10-CM | POA: Insufficient documentation

## 2021-04-22 DIAGNOSIS — Z79899 Other long term (current) drug therapy: Secondary | ICD-10-CM | POA: Diagnosis not present

## 2021-04-22 DIAGNOSIS — Z8546 Personal history of malignant neoplasm of prostate: Secondary | ICD-10-CM | POA: Insufficient documentation

## 2021-04-22 DIAGNOSIS — Z87891 Personal history of nicotine dependence: Secondary | ICD-10-CM | POA: Diagnosis not present

## 2021-04-22 DIAGNOSIS — I130 Hypertensive heart and chronic kidney disease with heart failure and stage 1 through stage 4 chronic kidney disease, or unspecified chronic kidney disease: Secondary | ICD-10-CM | POA: Insufficient documentation

## 2021-04-22 DIAGNOSIS — Z7982 Long term (current) use of aspirin: Secondary | ICD-10-CM | POA: Insufficient documentation

## 2021-04-22 DIAGNOSIS — I2510 Atherosclerotic heart disease of native coronary artery without angina pectoris: Secondary | ICD-10-CM | POA: Diagnosis not present

## 2021-04-22 DIAGNOSIS — N183 Chronic kidney disease, stage 3 unspecified: Secondary | ICD-10-CM | POA: Insufficient documentation

## 2021-04-22 DIAGNOSIS — F0390 Unspecified dementia without behavioral disturbance: Secondary | ICD-10-CM | POA: Insufficient documentation

## 2021-04-22 DIAGNOSIS — Z96643 Presence of artificial hip joint, bilateral: Secondary | ICD-10-CM | POA: Insufficient documentation

## 2021-04-22 DIAGNOSIS — I10 Essential (primary) hypertension: Secondary | ICD-10-CM

## 2021-04-22 DIAGNOSIS — R030 Elevated blood-pressure reading, without diagnosis of hypertension: Secondary | ICD-10-CM | POA: Diagnosis present

## 2021-04-22 LAB — CBC WITH DIFFERENTIAL/PLATELET
Abs Immature Granulocytes: 0.03 10*3/uL (ref 0.00–0.07)
Basophils Absolute: 0 10*3/uL (ref 0.0–0.1)
Basophils Relative: 0 %
Eosinophils Absolute: 0 10*3/uL (ref 0.0–0.5)
Eosinophils Relative: 0 %
HCT: 39.6 % (ref 39.0–52.0)
Hemoglobin: 13.2 g/dL (ref 13.0–17.0)
Immature Granulocytes: 0 %
Lymphocytes Relative: 20 %
Lymphs Abs: 1.7 10*3/uL (ref 0.7–4.0)
MCH: 30.8 pg (ref 26.0–34.0)
MCHC: 33.3 g/dL (ref 30.0–36.0)
MCV: 92.5 fL (ref 80.0–100.0)
Monocytes Absolute: 0.9 10*3/uL (ref 0.1–1.0)
Monocytes Relative: 10 %
Neutro Abs: 5.9 10*3/uL (ref 1.7–7.7)
Neutrophils Relative %: 70 %
Platelets: 255 10*3/uL (ref 150–400)
RBC: 4.28 MIL/uL (ref 4.22–5.81)
RDW: 15.4 % (ref 11.5–15.5)
WBC: 8.5 10*3/uL (ref 4.0–10.5)
nRBC: 0 % (ref 0.0–0.2)

## 2021-04-22 LAB — TROPONIN I (HIGH SENSITIVITY)
Troponin I (High Sensitivity): 45 ng/L — ABNORMAL HIGH (ref <18)
Troponin I (High Sensitivity): 45 ng/L — ABNORMAL HIGH (ref <18)

## 2021-04-22 LAB — URINALYSIS, COMPLETE (UACMP) WITH MICROSCOPIC
Bacteria, UA: NONE SEEN
Bilirubin Urine: NEGATIVE
Glucose, UA: NEGATIVE mg/dL
Hgb urine dipstick: NEGATIVE
Ketones, ur: NEGATIVE mg/dL
Leukocytes,Ua: NEGATIVE
Nitrite: NEGATIVE
Protein, ur: NEGATIVE mg/dL
Specific Gravity, Urine: 1.006 (ref 1.005–1.030)
WBC, UA: NONE SEEN WBC/hpf (ref 0–5)
pH: 7 (ref 5.0–8.0)

## 2021-04-22 LAB — COMPREHENSIVE METABOLIC PANEL
ALT: 28 U/L (ref 0–44)
AST: 32 U/L (ref 15–41)
Albumin: 3.9 g/dL (ref 3.5–5.0)
Alkaline Phosphatase: 80 U/L (ref 38–126)
Anion gap: 11 (ref 5–15)
BUN: 11 mg/dL (ref 8–23)
CO2: 26 mmol/L (ref 22–32)
Calcium: 9 mg/dL (ref 8.9–10.3)
Chloride: 101 mmol/L (ref 98–111)
Creatinine, Ser: 1.08 mg/dL (ref 0.61–1.24)
GFR, Estimated: >60 mL/min (ref >60)
Glucose, Bld: 97 mg/dL (ref 70–99)
Potassium: 4.2 mmol/L (ref 3.5–5.1)
Sodium: 138 mmol/L (ref 135–145)
Total Bilirubin: 1.4 mg/dL — ABNORMAL HIGH (ref 0.3–1.2)
Total Protein: 6.5 g/dL (ref 6.5–8.1)

## 2021-04-22 LAB — MAGNESIUM: Magnesium: 2 mg/dL (ref 1.7–2.4)

## 2021-04-22 NOTE — Discharge Instructions (Addendum)
His blood pressure was slightly elevated at 180/94 but came down to 150/90 without intervention but patient remained asymptomatic and his work-up was otherwise reassuring.  We do not want to lower this blood pressure too fast as it can cause worsening symptoms.  I suspect that he may not have felt well after the clonidine.  He had a negative CT head, negative x-ray and negative blood work.  You should repeat his blood pressures tomorrow and if they are still elevated discussed with the doctor about whether medications can be added onto his regimen.  I do not want to start him on something today because I do not want it to lower to fast.

## 2021-04-22 NOTE — ED Triage Notes (Signed)
Patient coming to ER via ambulance from [facility] for high blood presssure. Patient given 1 dose PO medication earlier today. Patient has no hx of HTN. Patient denies all complaints. Per staff patient baseline mental status is A&OX2.

## 2021-04-22 NOTE — ED Provider Notes (Signed)
[facility] Emergency Department Provider Note  ____________________________________________   Event Date/Time   First MD Initiated Contact with Patient 04/22/21 1914     (approximate)  I have reviewed the triage vital signs and the nursing notes.   HISTORY  Chief Complaint Hypertension    HPI Kyle Whitaker is a 87 y.o. male with CHF, hyperlipidemia, hypertension, dementia who comes in for high blood pressure from [place].  On review of patient's records from the [facility] patient is on amlodipine 10 mg, atenolol 75 mg in the morning.  His blood pressures typically run in the 130s to 150s. Pt has baseline dementia and AO x3 but denies any headaches, chest pain or other concerns.  I called the facility to get more collateral information. Doctor was there and his BP was 186/100 and gave him clonidine 0.1mg  check again in hour and half and then he 167/107 pulse 67 and he said he was not feeling well and wouldn't take his medications which is abnormal for him.   HPI is unable to be obtained due to patient's baseline dementia           Past Medical History:  Diagnosis Date  . Anxiety   . Arthritis   . Cancer [facility]) 2005   Prostate  . CHF (congestive heart failure) ([place])   . Coronary artery disease   . Hyperlipidemia   . Hypertension   . Peripheral vascular disease ([place])    carotid stenosis  . Shortness of breath dyspnea    on exertion    Patient Active Problem List   Diagnosis Date Noted  . Benign hypertension with CKD (chronic kidney disease) stage III ([place]) 08/07/2020  . Resides in skilled nursing facility 09/16/2018  . Essential hypertension 02/17/2018  . Healthcare maintenance 09/15/2017  . Dementia arising in the senium and presenium ([place]) 07/21/2017  . Acute renal failure (ARF) ([place]) 07/18/2017  . ARF (acute renal failure) ([place]) 07/16/2017  . Chest pain 06/06/2017  . Chronic prescription benzodiazepine use 05/18/2017  . H/O  non-ST elevation myocardial infarction (NSTEMI) 05/18/2017  . History of alcohol abuse 05/18/2017  . Hyperlipidemia, unspecified 05/18/2017  . Prostate cancer ([place]) 05/18/2017  . Hypertension 05/18/2017  . Hallucinations 09/19/2016  . Mild dementia ([place]) 09/19/2016  . Spells of formed visual hallucinations 09/19/2016  . Carotid stenosis 07/17/2016  . Shortness of breath 06/28/2016  . Carotid artery stenosis   . Chronic coronary artery disease   . Carotid artery calcification, left 06/19/2016  . Left-sided carotid artery disease ([place]) 06/19/2016  . Primary osteoarthritis of left hip 10/29/2015  . Chronic diastolic CHF (congestive heart failure) ([place]) 03/15/2015    Past Surgical History:  Procedure Laterality Date  . CATARACT EXTRACTION W/PHACO Right 12/10/2016   Procedure: CATARACT EXTRACTION PHACO AND INTRAOCULAR LENS PLACEMENT (IOC);  Surgeon: Eulogio Bear, MD;  Location: [facility] ORS;  Service: Ophthalmology;  Laterality: Right;  Lot #[id number] H US:01:31.2 AP%:18.4 CDE:17.30  . CATARACT EXTRACTION W/PHACO Left 01/07/2017   Procedure: CATARACT EXTRACTION PHACO AND INTRAOCULAR LENS PLACEMENT (IOC);  Surgeon: Eulogio Bear, MD;  Location: [facility] ORS;  Service: Ophthalmology;  Laterality: Left;  Lot# [id number] H [place]: 01:05.8 AP%: 16.3 CDE: 10.72  . ENDARTERECTOMY Right 07/17/2016   Procedure: ENDARTERECTOMY CAROTID;  Surgeon: Katha Cabal, MD;  Location: [facility] ORS;  Service: Vascular;  Laterality: Right;  . JOINT REPLACEMENT Right 2011   Total Hip Replacement, [facility]  . JOINT REPLACEMENT Right 2011   Total Knee Replacement, [facility]  .  JOINT REPLACEMENT Left 2015   total hip replacement  . KNEE ARTHROSCOPY Right 2011  . PROSTATE SURGERY    . TOTAL HIP ARTHROPLASTY Left 10/29/2015   Procedure: TOTAL HIP ARTHROPLASTY ANTERIOR APPROACH;  Surgeon: Hessie Knows, MD;  Location: [facility] ORS;  Service: Orthopedics;  Laterality: Left;    Prior to Admission medications   Medication Sig Start  Date End Date Taking? Authorizing Provider  amLODipine (NORVASC) 10 MG tablet  07/19/20   [provider]  amLODipine (NORVASC) 5 MG tablet Take 1 tablet (5 mg total) by mouth daily. Patient not taking: Reported on 08/07/2020 09/21/16   Gladstone Lighter, MD  ARIPiprazole (ABILIFY) 10 MG tablet Take by mouth. Patient not taking: Reported on 08/07/2020    [provider]  aspirin EC 81 MG EC tablet Take 1 tablet (81 mg total) by mouth daily. 07/18/16   Algernon Huxley, MD  atenolol (TENORMIN) 25 MG tablet Take 25 mg by mouth daily.    [provider]  clonazePAM (KLONOPIN) 0.5 MG tablet Take by mouth. Patient not taking: Reported on 08/07/2020    [provider]  cyanocobalamin (,VITAMIN B-12,) 1000 MCG/ML injection Inject into the muscle.    [provider]  diazepam (VALIUM) 2 MG tablet Take 1 tablet (2 mg total) by mouth 2 (two) times daily. Patient not taking: Reported on 08/07/2020 07/20/17   Epifanio Lesches, MD  diazepam (VALIUM) 2 MG tablet Take 1 tablet by mouth 2 (two) times daily. Patient not taking: Reported on 08/07/2020 06/28/18   [provider]  DIPHENHIST 25 MG capsule Take by mouth. 04/22/20   [provider]  donepezil (ARICEPT) 10 MG tablet Take 10 mg by mouth at bedtime.    [provider]  EQL ANTACID/ANTI-GAS 200-200-20 MG/5ML suspension Take by mouth. 04/22/20   [provider]  Eszopiclone 3 MG TABS Take by mouth. Patient not taking: Reported on 08/07/2020    [provider]  feeding supplement, ENSURE ENLIVE, (ENSURE ENLIVE) LIQD Take 237 mLs by mouth 3 (three) times daily between meals. 07/20/17   Epifanio Lesches, MD  folic acid (FOLVITE) 1 MG tablet Take 1 mg by mouth daily. Patient not taking: Reported on 08/07/2020    [provider]  guaiFENesin (ROBITUSSIN) 100 MG/5ML liquid Take by mouth. 04/22/20   [provider]  hydrochlorothiazide (HYDRODIURIL) 25 MG tablet  Take 25 mg by mouth daily. Patient not taking: Reported on 08/07/2020    [provider]  hydrOXYzine (VISTARIL) 25 MG capsule Take by mouth. 04/22/20   [provider]  lisinopril-hydrochlorothiazide (PRINZIDE,ZESTORETIC) 20-25 MG tablet Take 1 tablet by mouth daily. Patient not taking: Reported on 08/07/2020 02/07/18   [provider]  lithium citrate 300 MG/5ML solution Take by mouth. Patient not taking: Reported on 08/07/2020    [provider]  LOPERAMIDE A-D 2 MG tablet 2 mg elemental calcium/kg/hr. 04/22/20   [provider]  Magnesium Oxide 500 MG TABS Take 500 mg by mouth at bedtime. Patient not taking: Reported on 08/07/2020    [provider]  Melatonin 3 MG TABS Take by mouth. Patient not taking: Reported on 08/07/2020    [provider]  memantine (NAMENDA) 10 MG tablet Take 10 mg by mouth at bedtime. Patient not taking: Reported on 08/07/2020    [provider]  memantine [facility]) 5 MG tablet  05/25/20   [provider]  MILK OF MAGNESIA 400 MG/5ML suspension Take by mouth. 04/22/20   [provider]  mirtazapine (REMERON) 30 MG tablet Take 30 mg by mouth at bedtime. Patient not taking: Reported on 08/07/2020    [provider]  Multiple Vitamin (MULTIVITAMIN WITH MINERALS) TABS tablet Take 1 tablet by mouth daily. 07/20/17   Epifanio Lesches, MD  prednisoLONE acetate (PRED FORTE) 1 % ophthalmic suspension Administer 1 drop to both eyes Four (4) times a day. Patient not taking: Reported on 08/07/2020 03/11/17   [provider]  pyridOXINE (B-6) 50 MG tablet Take by mouth. Patient not taking: Reported on 08/07/2020    [provider]  risperiDONE (RISPERDAL M-TABS) 0.5 MG disintegrating tablet Take 1 tablet (0.5 mg total) by mouth at bedtime. Patient not taking: Reported on 08/07/2020 09/20/16   Gladstone Lighter, MD  risperiDONE (RISPERDAL) 0.5 MG tablet Take by  mouth. Patient not taking: Reported on 08/07/2020 09/23/16   [provider]  thiamine 100 MG tablet Take 1 tablet (100 mg total) by mouth daily. Patient not taking: Reported on 08/07/2020 07/20/17   Epifanio Lesches, MD  Vitamin D, Ergocalciferol, (DRISDOL) 50000 units CAPS capsule Take 50,000 Units by mouth once a week. Fridays Patient not taking: Reported on 08/07/2020    [provider]  zinc gluconate 50 MG tablet Take 50 mg by mouth at bedtime. Patient not taking: Reported on 08/07/2020    [provider]    Allergies Vicodin [hydrocodone-acetaminophen] and Penicillins  Family History  Problem Relation Age of Onset  . CAD Mother     Social History Social History   Tobacco Use  . Smoking status: Former Smoker    Packs/day: 1.00    Types: Cigarettes  . Smokeless tobacco: Never Used  . Tobacco comment: quit 50 years ago  Substance Use Topics  . Alcohol use: No  . Drug use: No      Review of Systems patient denies all the symptoms but it is somewhat limited due to patient's baseline dementia Constitutional: No fever/chills, high blood pressure  Eyes: No visual changes. ENT: No sore throat. Cardiovascular: Denies chest pain. Respiratory: Denies shortness of breath. Gastrointestinal: No abdominal pain.  No nausea, no vomiting.  No diarrhea.  No constipation. Genitourinary: Negative for dysuria. Musculoskeletal: Negative for back pain. Skin: Negative for rash. Neurological: Negative for headaches, focal weakness or numbness. All other ROS negative ____________________________________________   PHYSICAL EXAM:  VITAL SIGNS: Blood pressure (!) 183/103, pulse 75, temperature 97.7 F (36.5 C), temperature source Oral, resp. rate 20, height 5\' 9"  (1.753 m), weight 84 kg, SpO2 98 %.  Constitutional: Alert and oriented. Well appearing and in no acute distress. Eyes: Conjunctivae are normal. EOMI. Head: Atraumatic. Nose: No  congestion/rhinnorhea. Mouth/Throat: Mucous membranes are moist.   Neck: No stridor. Trachea Midline. FROM Cardiovascular: Normal rate, regular rhythm. Grossly normal heart sounds.  Good peripheral circulation. Respiratory: Normal respiratory effort.  No retractions. Lungs CTAB. Gastrointestinal: Soft and nontender. No distention. No abdominal bruits.  Musculoskeletal: No lower extremity tenderness nor edema.  No joint effusions. Neurologic:  Normal speech and language. No gross focal neurologic deficits are appreciated.  Skin:  Skin is warm, dry and intact. No rash noted. Psychiatric: Mood and affect are normal. Speech and behavior are normal. GU: Deferred   ____________________________________________   LABS (all labs ordered are listed, but only abnormal results are displayed)  Labs Reviewed  COMPREHENSIVE METABOLIC PANEL - Abnormal; Notable for the following components:      Result Value   Total Bilirubin 1.4 (*)    All other components within normal limits  TROPONIN I (HIGH SENSITIVITY) - Abnormal; Notable for the following components:   Troponin I (High Sensitivity) 45 (*)    All other components within normal limits  CBC WITH DIFFERENTIAL/PLATELET  MAGNESIUM  URINALYSIS, COMPLETE (UACMP) WITH MICROSCOPIC  TROPONIN I (HIGH SENSITIVITY)   ____________________________________________   ED ECG REPORT I, Vanessa [place], the attending physician, personally viewed and interpreted this ECG.  Normal sinus, rate of 78, no ST elevation, no T wave inversions, left bundle branch block with occasional PVC ____________________________________________  RADIOLOGY Robert Bellow, personally viewed and evaluated these images (plain radiographs) as part of my medical decision making, as well as reviewing the written report by the radiologist.  ED MD interpretation:  No intracranial hemorrhage  Official radiology report(s): CT Head Wo Contrast  Result Date: 04/22/2021 CLINICAL DATA:   Dizziness EXAM: CT HEAD WITHOUT CONTRAST TECHNIQUE: Contiguous axial images were obtained from the base of the skull through the vertex without intravenous contrast. COMPARISON:  CT head 03/17/2021 FINDINGS: Brain: Generalized atrophy. Diffuse white matter hypodensity most prominent the frontal lobes is unchanged. Negative for acute infarct, acute hemorrhage, mass. Vascular: Negative for hyperdense vessel Skull: Negative Sinuses/Orbits: Negative Other: None IMPRESSION: Atrophy and chronic microvascular ischemic change in the white matter. No acute abnormality no change from the prior CT. Electronically Signed   By: Franchot Gallo M.D.   On: 04/22/2021 20:16    ____________________________________________   PROCEDURES  Procedure(s) performed (including Critical Care):  .1-3 Lead EKG Interpretation Performed by: Vanessa [place], MD Authorized by: Vanessa [place], MD     Interpretation: normal     ECG rate:  70s    ECG rate assessment: normal     Rhythm: sinus rhythm     Ectopy: none     Conduction: normal       ____________________________________________   INITIAL IMPRESSION / ASSESSMENT AND PLAN / ED COURSE  Kyle Whitaker was evaluated in Emergency Department on 04/22/2021 for the symptoms described in the history of present illness. He was evaluated in the context of the global COVID-19 pandemic, which necessitated consideration that the patient might be at risk for infection with the SARS-CoV-2 virus that causes COVID-19. Institutional protocols and algorithms that pertain to the evaluation of patients at risk for COVID-19 are in a state of rapid change based on information released by regulatory bodies including the CDC and federal and state organizations. These policies and algorithms were followed during the patient's care in the ED.     Patient is an 87 year old who comes in with elevated blood pressure.  Initially patient was asymptomatic for patient was then given clonidine to  help with the blood pressure and started to not feel well according to the facility.  I suspect that patient might of had a reaction to the clonidine but will get labs to evaluate for Electra MIs, AKI keep patient on the cardiac monitor.  We will get CT head to make sure no evidence of intracranial hemorrhage given the elevated blood pressure and chest x-ray to make sure no evidence of dissection although my suspicion is very low.  Patient is very pleasant but denies any symptoms at this time.  So far is reassuring.  Initial cardiac marker was slightly elevated but has been that high previously.  Will get repeat.  CT head is without evidence of intracranial hemorrhage.  UA is negative.  Repeat troponin is stable at 45 therefore feel patient can be discharged home.  Recent blood pressure is  165/101.  Chest x-ray was negative.  Reevaluated patient denies any symptoms still.  Therefore we will discharge patient back home  I discussed the provisional nature of ED diagnosis, the treatment so far, the ongoing plan of care, follow up appointments and return precautions with the patient and any family or support people present. They expressed understanding and agreed with the plan, discharged home.            ____________________________________________   FINAL CLINICAL IMPRESSION(S) / ED DIAGNOSES   Final diagnoses:  Hypertension, unspecified type      MEDICATIONS GIVEN DURING THIS VISIT:  Medications - No data to display   ED Discharge Orders    None       Note:  This document was prepared using Dragon voice recognition software and may include unintentional dictation errors.   Vanessa [place], MD 04/22/21 2257

## 2021-04-22 NOTE — ED Notes (Signed)
Patient transported to CT 

## 2021-06-19 ENCOUNTER — Other Ambulatory Visit: Payer: Self-pay | Admitting: Internal Medicine

## 2021-06-19 DIAGNOSIS — R633 Feeding difficulties, unspecified: Secondary | ICD-10-CM

## 2021-06-19 DIAGNOSIS — R1313 Dysphagia, pharyngeal phase: Secondary | ICD-10-CM

## 2021-06-26 ENCOUNTER — Ambulatory Visit: Payer: Medicare Other | Attending: Internal Medicine

## 2021-07-30 ENCOUNTER — Other Ambulatory Visit (INDEPENDENT_AMBULATORY_CARE_PROVIDER_SITE_OTHER): Payer: Self-pay | Admitting: Vascular Surgery

## 2021-07-30 DIAGNOSIS — I6523 Occlusion and stenosis of bilateral carotid arteries: Secondary | ICD-10-CM

## 2021-08-03 NOTE — Progress Notes (Deleted)
MRN : [id number]  Kyle Whitaker is a 87 y.o. (05-24-1930) male who presents with chief complaint of follow up carotid blockage.  History of Present Illness:  The patient is seen for follow up evaluation of carotid stenosis. The carotid stenosis followed by ultrasound.   The patient denies amaurosis fugax. There is no recent history of TIA symptoms or focal motor deficits. There is no prior documented CVA.   The patient is taking enteric-coated aspirin 81 mg daily.   There is no history of migraine headaches. There is no history of seizures.   The patient has a history of coronary artery disease, no recent episodes of angina or shortness of breath. The patient denies PAD or claudication symptoms. There is a history of hyperlipidemia which is being treated with a statin.    No outpatient medications have been marked as taking for the 08/04/21 encounter (Appointment) with Delana Meyer, Dolores Lory, MD.    Past Medical History:  Diagnosis Date   Anxiety    Arthritis    Cancer [facility]) 2005   Prostate   CHF (congestive heart failure) ([place])    Coronary artery disease    Hyperlipidemia    Hypertension    Peripheral vascular disease ([place])    carotid stenosis   Shortness of breath dyspnea    on exertion    Past Surgical History:  Procedure Laterality Date   CATARACT EXTRACTION W/PHACO Right 12/10/2016   Procedure: CATARACT EXTRACTION PHACO AND INTRAOCULAR LENS PLACEMENT ([place]);  Surgeon: Eulogio Bear, MD;  Location: [facility] ORS;  Service: Ophthalmology;  Laterality: Right;  Lot [id number] H US:01:31.2 AP%:18.4 CDE:17.30   CATARACT EXTRACTION W/PHACO Left 01/07/2017   Procedure: CATARACT EXTRACTION PHACO AND INTRAOCULAR LENS PLACEMENT ([place]);  Surgeon: Eulogio Bear, MD;  Location: [facility] ORS;  Service: Ophthalmology;  Laterality: Left;  Lot# [id number] H [place]: 01:05.8 AP%: 16.3 CDE: 10.72   ENDARTERECTOMY Right 07/17/2016   Procedure: ENDARTERECTOMY CAROTID;  Surgeon: Katha Cabal, MD;   Location: [facility] ORS;  Service: Vascular;  Laterality: Right;   JOINT REPLACEMENT Right 2011   Total Hip Replacement, [place]   JOINT REPLACEMENT Right 2011   [facility] Knee Replacement, [facility]   JOINT REPLACEMENT Left 2015   total hip replacement   KNEE ARTHROSCOPY Right 2011   PROSTATE SURGERY     TOTAL HIP ARTHROPLASTY Left 10/29/2015   Procedure: TOTAL HIP ARTHROPLASTY ANTERIOR APPROACH;  Surgeon: Hessie Knows, MD;  Location: [facility] ORS;  Service: Orthopedics;  Laterality: Left;    Social History Social History   Tobacco Use   Smoking status: Former    Packs/day: 1.00    Types: Cigarettes   Smokeless tobacco: Never   Tobacco comments:    quit 50 years ago  Substance Use Topics   Alcohol use: No   Drug use: No    Family History Family History  Problem Relation Age of Onset   CAD Mother     Allergies  Allergen Reactions   Vicodin [Hydrocodone-Acetaminophen] Shortness Of Breath   Penicillins Itching    Has patient had a PCN reaction causing immediate rash, facial/tongue/throat swelling, SOB or lightheadedness with hypotension: No Has patient had a PCN reaction causing severe rash involving mucus membranes or skin necrosis: No Has patient had a PCN reaction that required hospitalization No Has patient had a PCN reaction occurring within the last 10 years: No If all of the above answers are "NO", then may proceed with Cephalosporin use.      REVIEW OF SYSTEMS (Negative  unless checked)  Constitutional: '[]'$ Weight loss  '[]'$ Fever  '[]'$ Chills Cardiac: '[]'$ Chest pain   '[]'$ Chest pressure   '[]'$ Palpitations   '[]'$ Shortness of breath when laying flat   '[]'$ Shortness of breath with exertion. Vascular:  '[]'$ Pain in legs with walking   '[]'$ Pain in legs at rest  '[]'$ History of DVT   '[]'$ Phlebitis   '[]'$ Swelling in legs   '[]'$ Varicose veins   '[]'$ Non-healing ulcers Pulmonary:   '[]'$ Uses home oxygen   '[]'$ Productive cough   '[]'$ Hemoptysis   '[]'$ Wheeze  '[]'$ COPD   '[]'$ Asthma Neurologic:  '[]'$ Dizziness   '[]'$ Seizures   '[]'$ History of  stroke   '[]'$ History of TIA  '[]'$ Aphasia   '[]'$ Vissual changes   '[]'$ Weakness or numbness in arm   '[]'$ Weakness or numbness in leg Musculoskeletal:   '[]'$ Joint swelling   '[]'$ Joint pain   '[]'$ Low back pain Hematologic:  '[]'$ Easy bruising  '[]'$ Easy bleeding   '[]'$ Hypercoagulable state   '[]'$ Anemic Gastrointestinal:  '[]'$ Diarrhea   '[]'$ Vomiting  '[]'$ Gastroesophageal reflux/heartburn   '[]'$ Difficulty swallowing. Genitourinary:  '[]'$ Chronic kidney disease   '[]'$ Difficult urination  '[]'$ Frequent urination   '[]'$ Blood in urine Skin:  '[]'$ Rashes   '[]'$ Ulcers  Psychological:  '[]'$ History of anxiety   '[]'$  History of major depression.  Physical Examination  There were no vitals filed for this visit. There is no height or weight on file to calculate BMI. Gen: WD/WN, NAD Head: [place]/AT, No temporalis wasting.  Ear/Nose/Throat: Hearing grossly intact, nares w/o erythema or drainage Eyes: PER, EOMI, sclera nonicteric.  Neck: Supple, no masses.  No bruit or JVD.  Pulmonary:  Good air movement, no audible wheezing, no use of accessory muscles.  Cardiac: RRR, normal S1, S2, no Murmurs. Vascular:  *** Vessel Right Left  Radial Palpable Palpable  Carotid Palpable Palpable  PT Palpable Palpable  DP Palpable Palpable  Gastrointestinal: soft, non-distended. No guarding/no peritoneal signs.  Musculoskeletal: M/S 5/5 throughout.  No visible deformity.  Neurologic: CN 2-12 intact. Pain and light touch intact in extremities.  Symmetrical.  Speech is fluent. Motor exam as listed above. Psychiatric: Judgment intact, Mood & affect appropriate for pt's clinical situation. Dermatologic: No rashes or ulcers noted.  No changes consistent with cellulitis.   CBC Lab Results  Component Value Date   WBC 8.5 04/22/2021   HGB 13.2 04/22/2021   HCT 39.6 04/22/2021   MCV 92.5 04/22/2021   PLT 255 04/22/2021    BMET    Component Value Date/Time   NA 138 04/22/2021 1925   NA 137 11/24/2013 1435   K 4.2 04/22/2021 1925   K 3.5 11/24/2013 1435   CL 101  04/22/2021 1925   CL 104 11/24/2013 1435   CO2 26 04/22/2021 1925   CO2 32 11/24/2013 1435   GLUCOSE 97 04/22/2021 1925   GLUCOSE 97 11/24/2013 1435   BUN 11 04/22/2021 1925   BUN 16 11/24/2013 1435   CREATININE 1.08 04/22/2021 1925   CREATININE 1.17 11/24/2013 1435   CALCIUM 9.0 04/22/2021 1925   CALCIUM 9.0 11/24/2013 1435   GFRNONAA >60 04/22/2021 1925   GFRNONAA 58 (L) 11/24/2013 1435   GFRAA 56 (L) 07/17/2017 1011   GFRAA >60 11/24/2013 1435   CrCl cannot be calculated (Patient's most recent lab result is older than the maximum 21 days allowed.).  COAG Lab Results  Component Value Date   INR 1.03 07/10/2016   INR 1.14 11/17/2015   INR 1.10 10/14/2015    Radiology No results found.   Assessment/Plan There are no diagnoses linked to this encounter.   Hortencia Pilar, MD  08/03/2021 2:45 PM

## 2021-08-04 ENCOUNTER — Encounter (INDEPENDENT_AMBULATORY_CARE_PROVIDER_SITE_OTHER): Payer: Medicare Other

## 2021-08-04 ENCOUNTER — Ambulatory Visit (INDEPENDENT_AMBULATORY_CARE_PROVIDER_SITE_OTHER): Payer: Medicare Other | Admitting: Vascular Surgery

## 2021-08-04 DIAGNOSIS — I10 Essential (primary) hypertension: Secondary | ICD-10-CM

## 2021-08-04 DIAGNOSIS — I6523 Occlusion and stenosis of bilateral carotid arteries: Secondary | ICD-10-CM

## 2021-08-04 DIAGNOSIS — E782 Mixed hyperlipidemia: Secondary | ICD-10-CM

## 2021-08-04 DIAGNOSIS — I2510 Atherosclerotic heart disease of native coronary artery without angina pectoris: Secondary | ICD-10-CM

## 2021-08-21 ENCOUNTER — Encounter (INDEPENDENT_AMBULATORY_CARE_PROVIDER_SITE_OTHER): Payer: Medicare Other

## 2021-08-21 ENCOUNTER — Ambulatory Visit (INDEPENDENT_AMBULATORY_CARE_PROVIDER_SITE_OTHER): Payer: Medicare Other | Admitting: Vascular Surgery

## 2021-10-30 ENCOUNTER — Other Ambulatory Visit: Payer: Self-pay

## 2021-10-30 ENCOUNTER — Ambulatory Visit (INDEPENDENT_AMBULATORY_CARE_PROVIDER_SITE_OTHER): Payer: Medicare Other | Admitting: Podiatry

## 2021-10-30 ENCOUNTER — Encounter (INDEPENDENT_AMBULATORY_CARE_PROVIDER_SITE_OTHER): Payer: Self-pay

## 2021-10-30 ENCOUNTER — Encounter: Payer: Self-pay | Admitting: Podiatry

## 2021-10-30 DIAGNOSIS — M79675 Pain in left toe(s): Secondary | ICD-10-CM | POA: Diagnosis not present

## 2021-10-30 DIAGNOSIS — M79674 Pain in right toe(s): Secondary | ICD-10-CM | POA: Diagnosis not present

## 2021-10-30 DIAGNOSIS — B351 Tinea unguium: Secondary | ICD-10-CM

## 2021-10-30 NOTE — Progress Notes (Signed)
  Subjective:  Patient ID: Kyle Whitaker, male    DOB: 1930/11/09,  MRN: [id number]  Chief Complaint  Patient presents with   Nail Problem    Nail trim    86 y.o. male returns for the above complaint.  Patient presents with thickened elongated dystrophic toenails x10.  Patient would like to have the debrided his not able to do it himself.  He denies any other acute complaints is not a diabetic.  Objective:  There were no vitals filed for this visit. Podiatric Exam: Vascular: dorsalis pedis and posterior tibial pulses are palpable bilateral. Capillary return is immediate. Temperature gradient is WNL. Skin turgor WNL  Sensorium: Normal Semmes Weinstein monofilament test. Normal tactile sensation bilaterally. Nail Exam: Pt has thick disfigured discolored nails with subungual debris noted bilateral entire nail hallux through fifth toenails.  Pain on palpation to the nails. Ulcer Exam: There is no evidence of ulcer or pre-ulcerative changes or infection. Orthopedic Exam: Muscle tone and strength are WNL. No limitations in general ROM. No crepitus or effusions noted.  Skin: No Porokeratosis. No infection or ulcers    Assessment & Plan:   1. Pain due to onychomycosis of toenails of both feet     Patient was evaluated and treated and all questions answered.  Onychomycosis with pain  -Nails palliatively debrided as below. -Educated on self-care  Procedure: Nail Debridement Rationale: pain  Type of Debridement: manual, sharp debridement. Instrumentation: Nail nipper, rotary burr. Number of Nails: 10  Procedures and Treatment: Consent by patient was obtained for treatment procedures. The patient understood the discussion of treatment and procedures well. All questions were answered thoroughly reviewed. Debridement of mycotic and hypertrophic toenails, 1 through 5 bilateral and clearing of subungual debris. No ulceration, no infection noted.  Return Visit-Office Procedure: Patient  instructed to return to the office for a follow up visit 3 months for continued evaluation and treatment.  Boneta Lucks, DPM    No follow-ups on file.

## 2021-11-09 ENCOUNTER — Other Ambulatory Visit: Payer: Self-pay

## 2021-11-09 ENCOUNTER — Encounter: Payer: Self-pay | Admitting: Emergency Medicine

## 2021-11-09 ENCOUNTER — Emergency Department: Payer: Medicare Other

## 2021-11-09 ENCOUNTER — Emergency Department
Admission: EM | Admit: 2021-11-09 | Discharge: 2021-11-09 | Disposition: A | Discharge status: Home / Self Care | Payer: Medicare Other | Attending: Emergency Medicine | Admitting: Emergency Medicine

## 2021-11-09 DIAGNOSIS — I4892 Unspecified atrial flutter: Secondary | ICD-10-CM

## 2021-11-09 DIAGNOSIS — N183 Chronic kidney disease, stage 3 unspecified: Secondary | ICD-10-CM | POA: Diagnosis not present

## 2021-11-09 DIAGNOSIS — R079 Chest pain, unspecified: Secondary | ICD-10-CM | POA: Diagnosis not present

## 2021-11-09 DIAGNOSIS — Z79899 Other long term (current) drug therapy: Secondary | ICD-10-CM | POA: Diagnosis not present

## 2021-11-09 DIAGNOSIS — Z87891 Personal history of nicotine dependence: Secondary | ICD-10-CM | POA: Insufficient documentation

## 2021-11-09 DIAGNOSIS — I2510 Atherosclerotic heart disease of native coronary artery without angina pectoris: Secondary | ICD-10-CM | POA: Insufficient documentation

## 2021-11-09 DIAGNOSIS — Z96651 Presence of right artificial knee joint: Secondary | ICD-10-CM | POA: Insufficient documentation

## 2021-11-09 DIAGNOSIS — F0390 Unspecified dementia without behavioral disturbance: Secondary | ICD-10-CM | POA: Diagnosis not present

## 2021-11-09 DIAGNOSIS — R519 Headache, unspecified: Secondary | ICD-10-CM | POA: Diagnosis present

## 2021-11-09 DIAGNOSIS — Z96643 Presence of artificial hip joint, bilateral: Secondary | ICD-10-CM | POA: Diagnosis not present

## 2021-11-09 DIAGNOSIS — I5032 Chronic diastolic (congestive) heart failure: Secondary | ICD-10-CM | POA: Diagnosis not present

## 2021-11-09 DIAGNOSIS — Z7982 Long term (current) use of aspirin: Secondary | ICD-10-CM | POA: Diagnosis not present

## 2021-11-09 DIAGNOSIS — Z8546 Personal history of malignant neoplasm of prostate: Secondary | ICD-10-CM | POA: Diagnosis not present

## 2021-11-09 DIAGNOSIS — I130 Hypertensive heart and chronic kidney disease with heart failure and stage 1 through stage 4 chronic kidney disease, or unspecified chronic kidney disease: Secondary | ICD-10-CM | POA: Insufficient documentation

## 2021-11-09 LAB — CBC
HCT: 41.4 % (ref 39.0–52.0)
Hemoglobin: 13.9 g/dL (ref 13.0–17.0)
MCH: 31 pg (ref 26.0–34.0)
MCHC: 33.6 g/dL (ref 30.0–36.0)
MCV: 92.2 fL (ref 80.0–100.0)
Platelets: 243 10*3/uL (ref 150–400)
RBC: 4.49 MIL/uL (ref 4.22–5.81)
RDW: 14.3 % (ref 11.5–15.5)
WBC: 8.3 10*3/uL (ref 4.0–10.5)
nRBC: 0 % (ref 0.0–0.2)

## 2021-11-09 LAB — TROPONIN I (HIGH SENSITIVITY)
Troponin I (High Sensitivity): 20 ng/L — ABNORMAL HIGH (ref <18)
Troponin I (High Sensitivity): 21 ng/L — ABNORMAL HIGH (ref <18)

## 2021-11-09 LAB — BASIC METABOLIC PANEL
Anion gap: 9 (ref 5–15)
BUN: 21 mg/dL (ref 8–23)
CO2: 25 mmol/L (ref 22–32)
Calcium: 9 mg/dL (ref 8.9–10.3)
Chloride: 105 mmol/L (ref 98–111)
Creatinine, Ser: 1.02 mg/dL (ref 0.61–1.24)
GFR, Estimated: >60 mL/min (ref >60)
Glucose, Bld: 95 mg/dL (ref 70–99)
Potassium: 3.5 mmol/L (ref 3.5–5.1)
Sodium: 139 mmol/L (ref 135–145)

## 2021-11-09 NOTE — ED Notes (Signed)
This RN attempted to call pts son Dhiren Azimi at [phone], for transport back to facility, but number is not a working number.

## 2021-11-09 NOTE — ED Notes (Signed)
RN to bedside to introduce self to pt. MD at bedside. Pt in no acute distress.

## 2021-11-09 NOTE — ED Provider Notes (Addendum)
[facility] Emergency Department Provider Note   ____________________________________________   Event Date/Time   First MD Initiated Contact with Patient 11/09/21 1317     (approximate)  I have reviewed the triage vital signs and the nursing notes.   HISTORY  Chief Complaint Chest Pain    HPI FATE CASTER is a 87 y.o. male with past medical history of hypertension, hyperlipidemia, CAD, CHF, peripheral vascular disease, CKD, dementia who presents to the ED complaining of headache and chest pain.  History is limited due to patient's baseline dementia.  He states that he is here because he has been experiencing a frontal headache since yesterday.  He describes the pain as constant and achy, not exacerbated or alleviated by anything in particular.  He denies any associated vision changes, speech changes, numbness, or weakness.  Per EMS, patient had also complained to staff at his nursing facility about "funny feeling" in his chest.  He now states he has not had any pain in his chest "in some time."  He denies any fevers, cough, or difficulty breathing.  Patient at his baseline mental status per EMS.        Past Medical History:  Diagnosis Date   Anxiety    Arthritis    Cancer ([place]) 2005   Prostate   CHF (congestive heart failure) (HCC)    Coronary artery disease    Hyperlipidemia    Hypertension    Peripheral vascular disease ([place])    carotid stenosis   Shortness of breath dyspnea    on exertion    Patient Active Problem List   Diagnosis Date Noted   Benign hypertension with CKD (chronic kidney disease) stage III ([place]) 08/07/2020   Resides in skilled nursing facility 09/16/2018   Essential hypertension 02/17/2018   Healthcare maintenance 09/15/2017   Dementia arising in the senium and presenium ([place]) 07/21/2017   Acute renal failure (ARF) ([place]) 07/18/2017   ARF (acute renal failure) ([place]) 07/16/2017   Chest pain 06/06/2017   Chronic  prescription benzodiazepine use 05/18/2017   H/O non-ST elevation myocardial infarction (NSTEMI) 05/18/2017   History of alcohol abuse 05/18/2017   Hyperlipidemia, unspecified 05/18/2017   Prostate cancer ([place]) 05/18/2017   Hypertension 05/18/2017   Hallucinations 09/19/2016   Mild dementia 09/19/2016   Spells of formed visual hallucinations 09/19/2016   Carotid stenosis 07/17/2016   Shortness of breath 06/28/2016   Carotid artery stenosis    Chronic coronary artery disease    Carotid artery calcification, left 06/19/2016   Left-sided carotid artery disease ([place]) 06/19/2016   Primary osteoarthritis of left hip 10/29/2015   Chronic diastolic CHF (congestive heart failure) ([place]) 03/15/2015    Past Surgical History:  Procedure Laterality Date   CATARACT EXTRACTION W/PHACO Right 12/10/2016   Procedure: CATARACT EXTRACTION PHACO AND INTRAOCULAR LENS PLACEMENT (IOC);  Surgeon: Eulogio Bear, MD;  Location: [facility] ORS;  Service: Ophthalmology;  Laterality: Right;  Lot #[id number] H US:01:31.2 AP%:18.4 CDE:17.30   CATARACT EXTRACTION W/PHACO Left 01/07/2017   Procedure: CATARACT EXTRACTION PHACO AND INTRAOCULAR LENS PLACEMENT ([place]);  Surgeon: Eulogio Bear, MD;  Location: [facility] ORS;  Service: Ophthalmology;  Laterality: Left;  Lot# [id number] H [place]: 01:05.8 AP%: 16.3 CDE: 10.72   ENDARTERECTOMY Right 07/17/2016   Procedure: ENDARTERECTOMY CAROTID;  Surgeon: Katha Cabal, MD;  Location: [facility] ORS;  Service: Vascular;  Laterality: Right;   JOINT REPLACEMENT Right 2011   Total Hip Replacement, [facility]   JOINT REPLACEMENT Right 2011   Total Knee Replacement, [facility]  JOINT REPLACEMENT Left 2015   total hip replacement   KNEE ARTHROSCOPY Right 2011   PROSTATE SURGERY     TOTAL HIP ARTHROPLASTY Left 10/29/2015   Procedure: TOTAL HIP ARTHROPLASTY ANTERIOR APPROACH;  Surgeon: Hessie Knows, MD;  Location: [facility] ORS;  Service: Orthopedics;  Laterality: Left;    Prior to Admission medications    Medication Sig Start Date End Date Taking? Authorizing Provider  amLODipine (NORVASC) 10 MG tablet  07/19/20   [provider]  amLODipine (NORVASC) 5 MG tablet Take 1 tablet (5 mg total) by mouth daily. Patient not taking: Reported on 08/07/2020 09/21/16   Gladstone Lighter, MD  ARIPiprazole (ABILIFY) 10 MG tablet Take by mouth. Patient not taking: Reported on 08/07/2020    [provider]  aspirin EC 81 MG EC tablet Take 1 tablet (81 mg total) by mouth daily. 07/18/16   Algernon Huxley, MD  atenolol (TENORMIN) 25 MG tablet Take 25 mg by mouth daily.    [provider]  clonazePAM (KLONOPIN) 0.5 MG tablet Take by mouth. Patient not taking: Reported on 08/07/2020    [provider]  cyanocobalamin (,VITAMIN B-12,) 1000 MCG/ML injection Inject into the muscle.    [provider]  diazepam (VALIUM) 2 MG tablet Take 1 tablet (2 mg total) by mouth 2 (two) times daily. Patient not taking: Reported on 08/07/2020 07/20/17   Epifanio Lesches, MD  diazepam (VALIUM) 2 MG tablet Take 1 tablet by mouth 2 (two) times daily. Patient not taking: Reported on 08/07/2020 06/28/18   [provider]  DIPHENHIST 25 MG capsule Take by mouth. 04/22/20   [provider]  donepezil (ARICEPT) 10 MG tablet Take 10 mg by mouth at bedtime.    [provider]  EQL ANTACID/ANTI-GAS 200-200-20 MG/5ML suspension Take by mouth. 04/22/20   [provider]  Eszopiclone 3 MG TABS Take by mouth. Patient not taking: Reported on 08/07/2020    [provider]  feeding supplement, ENSURE ENLIVE, (ENSURE ENLIVE) LIQD Take 237 mLs by mouth 3 (three) times daily between meals. 07/20/17   Epifanio Lesches, MD  folic acid (FOLVITE) 1 MG tablet Take 1 mg by mouth daily. Patient not taking: Reported on 08/07/2020    [provider]  guaiFENesin (ROBITUSSIN) 100 MG/5ML liquid Take by mouth. 04/22/20   [provider]  hydrochlorothiazide  (HYDRODIURIL) 25 MG tablet Take 25 mg by mouth daily. Patient not taking: Reported on 08/07/2020    [provider]  hydrOXYzine (VISTARIL) 25 MG capsule Take by mouth. 04/22/20   [provider]  lisinopril-hydrochlorothiazide (PRINZIDE,ZESTORETIC) 20-25 MG tablet Take 1 tablet by mouth daily. Patient not taking: Reported on 08/07/2020 02/07/18   [provider]  lithium citrate 300 MG/5ML solution Take by mouth. Patient not taking: Reported on 08/07/2020    [provider]  LOPERAMIDE A-D 2 MG tablet 2 mg elemental calcium/kg/hr. 04/22/20   [provider]  Magnesium Oxide 500 MG TABS Take 500 mg by mouth at bedtime. Patient not taking: Reported on 08/07/2020    [provider]  Melatonin 3 MG TABS Take by mouth. Patient not taking: Reported on 08/07/2020    [provider]  memantine (NAMENDA) 10 MG tablet Take 10 mg by mouth at bedtime. Patient not taking: Reported on 08/07/2020    [provider]  memantine [facility]) 5 MG tablet  05/25/20   [provider]  MILK OF MAGNESIA 400 MG/5ML suspension Take by mouth. 04/22/20   [provider]  mirtazapine (REMERON) 30 MG tablet Take 30 mg by mouth at bedtime. Patient not taking: Reported on 08/07/2020    [provider]  Multiple Vitamin (MULTIVITAMIN WITH MINERALS) TABS tablet Take 1 tablet by mouth daily. 07/20/17   Epifanio Lesches, MD  prednisoLONE acetate (PRED FORTE) 1 % ophthalmic suspension Administer 1 drop to both eyes Four (4) times a day. Patient not taking: Reported on 08/07/2020 03/11/17   [provider]  pyridOXINE (B-6) 50 MG tablet Take by mouth. Patient not taking: Reported on 08/07/2020    [provider]  risperiDONE (RISPERDAL M-TABS) 0.5 MG disintegrating tablet Take 1 tablet (0.5 mg total) by mouth at bedtime. Patient not taking: Reported on 08/07/2020 09/20/16   Gladstone Lighter, MD  risperiDONE (RISPERDAL) 0.5 MG  tablet Take by mouth. Patient not taking: Reported on 08/07/2020 09/23/16   [provider]  thiamine 100 MG tablet Take 1 tablet (100 mg total) by mouth daily. Patient not taking: Reported on 08/07/2020 07/20/17   Epifanio Lesches, MD  Vitamin D, Ergocalciferol, (DRISDOL) 50000 units CAPS capsule Take 50,000 Units by mouth once a week. Fridays Patient not taking: Reported on 08/07/2020    [provider]  zinc gluconate 50 MG tablet Take 50 mg by mouth at bedtime. Patient not taking: Reported on 08/07/2020    [provider]    Allergies Vicodin [hydrocodone-acetaminophen] and Penicillins  Family History  Problem Relation Age of Onset   CAD Mother     Social History Social History   Tobacco Use   Smoking status: Former    Packs/day: 1.00    Types: Cigarettes   Smokeless tobacco: Never   Tobacco comments:    quit 50 years ago  Substance Use Topics   Alcohol use: No   Drug use: No    Review of Systems  Constitutional: No fever/chills Eyes: No visual changes. ENT: No sore throat. Cardiovascular: Denies chest pain. Respiratory: Denies shortness of breath. Gastrointestinal: No abdominal pain.  No nausea, no vomiting.  No diarrhea.  No constipation. Genitourinary: Negative for dysuria. Musculoskeletal: Negative for back pain. Skin: Negative for rash. Neurological: Positive for headache, negative for focal weakness or numbness.  ____________________________________________   PHYSICAL EXAM:  VITAL SIGNS: ED Triage Vitals  Enc Vitals Group     BP 11/09/21 1135 136/79     Pulse Rate 11/09/21 1135 70     Resp 11/09/21 1135 18     Temp 11/09/21 1135 97.7 F (36.5 C)     Temp Source 11/09/21 1135 Oral     SpO2 11/09/21 1135 100 %     Weight 11/09/21 1132 187 lb 6.3 oz (85 kg)     Height 11/09/21 1132 5\' 9"  (1.753 m)     Head Circumference --      Peak Flow --      Pain Score 11/09/21 1132 5     Pain Loc --      Pain Edu? --      Excl.  in [place]? --     Constitutional: Awake and alert. Eyes: Conjunctivae are normal. Head: Atraumatic. Nose: No congestion/rhinnorhea. Mouth/Throat: Mucous membranes are moist. Neck: Normal ROM Cardiovascular: Normal rate, regular rhythm. Grossly normal heart sounds.  2+ radial pulses bilaterally. Respiratory: Normal respiratory effort.  No retractions. Lungs CTAB.  No chest wall tenderness to palpation. Gastrointestinal: Soft and nontender. No distention. Genitourinary: deferred Musculoskeletal: No lower extremity tenderness nor edema. Neurologic:  Normal speech and language. No gross focal neurologic deficits are appreciated.  Skin:  Skin is warm, dry and intact. No rash noted. Psychiatric: Mood and affect are normal. Speech and behavior are normal.  ____________________________________________   LABS (all labs ordered are listed, but only abnormal results are displayed)  Labs Reviewed  TROPONIN I (HIGH SENSITIVITY) - Abnormal; Notable for the following components:      Result Value   Troponin I (High Sensitivity) 20 (*)    All other components within normal limits  TROPONIN I (HIGH SENSITIVITY) - Abnormal; Notable for the following components:   Troponin I (High Sensitivity) 21 (*)    All other components within normal limits  BASIC METABOLIC PANEL  CBC   ____________________________________________  EKG  ED ECG REPORT I, Blake Divine, the attending physician, personally viewed and interpreted this ECG.   Date: 11/09/2021  EKG Time: 11:37  Rate: 76  Rhythm: normal sinus rhythm  Axis: LAD  Intervals:nonspecific intraventricular conduction delay  ST&T Change: None    PROCEDURES  Procedure(s) performed (including Critical Care):  Procedures   ____________________________________________   INITIAL IMPRESSION / ASSESSMENT AND PLAN / ED COURSE      87 year old male with past medical history of hypertension, hyperlipidemia, CAD, CHF, peripheral vascular disease,  CKD, and dementia who presents to the ED for frontal throbbing headache since yesterday.  Patient is at his baseline mental status and has no focal neurologic deficits on exam.  He has previously been seen for headache in the past with benign work-ups, states pain today is easing up after receiving Tylenol at his nursing facility.  Given limited history and his advanced age, we will check CT head, labs thus far reassuring.  He had apparently complained of "funny feeling" in his chest, but now does not recall this.  Vital signs are reassuring, EKG shows no evidence of arrhythmia or ischemia.  Troponin mildly elevated but below his previous baseline, we will trend.  Additional labs are unremarkable, chest x-ray reviewed by me and shows no infiltrate, edema, or effusion.  CT head is negative for acute process, repeat troponin is stable from previous.  Patient noted to be in atrial flutter with controlled rate at this time, continues to deny any complaints including chest pain or shortness of breath.  Finding was discussed with Dr. Nehemiah Massed as patient does not have a history of atrial fibrillation or atrial flutter.  He agrees that patient is not a good candidate for anticoagulation given he is 87 years old with dementia and high risk for fall.  No need for rate control at this time as his rate has remained controlled throughout.  Dr. Nehemiah Massed will arrange for close cardiology follow-up and patient is appropriate for discharge home.  He was counseled to return to the ED for new or worsening symptoms, patient agrees with plan.      ____________________________________________   FINAL CLINICAL IMPRESSION(S) / ED DIAGNOSES  Final diagnoses:  Acute nonintractable headache, unspecified headache type  Nonspecific chest pain     ED Discharge Orders     None        Note:  This document was prepared using Dragon voice recognition software and may include unintentional dictation errors.    Blake Divine, MD 11/09/21 1541    Blake Divine, MD 11/09/21 1556

## 2021-11-09 NOTE — ED Notes (Signed)
This RN called [facility]. Report given to [place], Therapist, sports. The facility does not have any transport available for the pt to return from the ER to [facility].

## 2021-11-09 NOTE — ED Triage Notes (Signed)
Pt reports feels funny in his chest. Pt reports HA is easing up some. Pt difficult to understand and speaks really soft.

## 2021-11-09 NOTE — ED Triage Notes (Signed)
Pt in via EMS from [facility] with c/o HA for the past few days in the forehead, non radiating. Pt took 500mg  of tylenol at 0800 this am. RN went to give more medication but pt refused and came to the ED.  HR 78, 99% RA, 157/89, FSBS 126

## 2021-11-09 NOTE — ED Notes (Signed)
Called [facility] for transport to [facility] [place]

## 2021-11-14 ENCOUNTER — Encounter: Payer: Self-pay | Admitting: Emergency Medicine

## 2021-11-14 DIAGNOSIS — R0602 Shortness of breath: Secondary | ICD-10-CM | POA: Insufficient documentation

## 2021-11-14 DIAGNOSIS — F0390 Unspecified dementia without behavioral disturbance: Secondary | ICD-10-CM | POA: Diagnosis not present

## 2021-11-14 DIAGNOSIS — Z87891 Personal history of nicotine dependence: Secondary | ICD-10-CM | POA: Diagnosis not present

## 2021-11-14 DIAGNOSIS — Z8546 Personal history of malignant neoplasm of prostate: Secondary | ICD-10-CM | POA: Insufficient documentation

## 2021-11-14 DIAGNOSIS — Z79899 Other long term (current) drug therapy: Secondary | ICD-10-CM | POA: Diagnosis not present

## 2021-11-14 DIAGNOSIS — Z96643 Presence of artificial hip joint, bilateral: Secondary | ICD-10-CM | POA: Diagnosis not present

## 2021-11-14 DIAGNOSIS — I5032 Chronic diastolic (congestive) heart failure: Secondary | ICD-10-CM | POA: Diagnosis not present

## 2021-11-14 DIAGNOSIS — I130 Hypertensive heart and chronic kidney disease with heart failure and stage 1 through stage 4 chronic kidney disease, or unspecified chronic kidney disease: Secondary | ICD-10-CM | POA: Insufficient documentation

## 2021-11-14 DIAGNOSIS — Z7982 Long term (current) use of aspirin: Secondary | ICD-10-CM | POA: Insufficient documentation

## 2021-11-14 DIAGNOSIS — N390 Urinary tract infection, site not specified: Secondary | ICD-10-CM | POA: Diagnosis not present

## 2021-11-14 DIAGNOSIS — I2510 Atherosclerotic heart disease of native coronary artery without angina pectoris: Secondary | ICD-10-CM | POA: Insufficient documentation

## 2021-11-14 DIAGNOSIS — N183 Chronic kidney disease, stage 3 unspecified: Secondary | ICD-10-CM | POA: Insufficient documentation

## 2021-11-14 DIAGNOSIS — Z96651 Presence of right artificial knee joint: Secondary | ICD-10-CM | POA: Insufficient documentation

## 2021-11-14 LAB — CBC
HCT: 40.9 % (ref 39.0–52.0)
Hemoglobin: 13.4 g/dL (ref 13.0–17.0)
MCH: 30.5 pg (ref 26.0–34.0)
MCHC: 32.8 g/dL (ref 30.0–36.0)
MCV: 93.2 fL (ref 80.0–100.0)
Platelets: 252 10*3/uL (ref 150–400)
RBC: 4.39 MIL/uL (ref 4.22–5.81)
RDW: 14.5 % (ref 11.5–15.5)
WBC: 6.9 10*3/uL (ref 4.0–10.5)
nRBC: 0 % (ref 0.0–0.2)

## 2021-11-14 LAB — COMPREHENSIVE METABOLIC PANEL
ALT: 15 U/L (ref 0–44)
AST: 19 U/L (ref 15–41)
Albumin: 4 g/dL (ref 3.5–5.0)
Alkaline Phosphatase: 101 U/L (ref 38–126)
Anion gap: 6 (ref 5–15)
BUN: 21 mg/dL (ref 8–23)
CO2: 29 mmol/L (ref 22–32)
Calcium: 9.3 mg/dL (ref 8.9–10.3)
Chloride: 105 mmol/L (ref 98–111)
Creatinine, Ser: 1.1 mg/dL (ref 0.61–1.24)
GFR, Estimated: >60 mL/min (ref >60)
Glucose, Bld: 83 mg/dL (ref 70–99)
Potassium: 4 mmol/L (ref 3.5–5.1)
Sodium: 140 mmol/L (ref 135–145)
Total Bilirubin: 0.8 mg/dL (ref 0.3–1.2)
Total Protein: 7 g/dL (ref 6.5–8.1)

## 2021-11-14 NOTE — ED Triage Notes (Signed)
Pt in via EMS, report never obtained from them. Per pt, he is from [facility], states there were people painting at his facility, and he became SOB. Breathing e/u in triage, pt talkative and seems confused. VSS

## 2021-11-15 ENCOUNTER — Emergency Department
Admission: EM | Admit: 2021-11-15 | Discharge: 2021-11-15 | Disposition: A | Discharge status: Home / Self Care | Payer: Medicare Other | Attending: Emergency Medicine | Admitting: Emergency Medicine

## 2021-11-15 ENCOUNTER — Emergency Department: Payer: Medicare Other

## 2021-11-15 ENCOUNTER — Other Ambulatory Visit: Payer: Self-pay

## 2021-11-15 DIAGNOSIS — R0602 Shortness of breath: Secondary | ICD-10-CM | POA: Diagnosis not present

## 2021-11-15 DIAGNOSIS — N390 Urinary tract infection, site not specified: Secondary | ICD-10-CM

## 2021-11-15 LAB — URINALYSIS, COMPLETE (UACMP) WITH MICROSCOPIC
Bilirubin Urine: NEGATIVE
Glucose, UA: NEGATIVE mg/dL
Hgb urine dipstick: NEGATIVE
Ketones, ur: NEGATIVE mg/dL
Nitrite: POSITIVE — AB
Protein, ur: 30 mg/dL — AB
Specific Gravity, Urine: 1.017 (ref 1.005–1.030)
pH: 7 (ref 5.0–8.0)

## 2021-11-15 LAB — BRAIN NATRIURETIC PEPTIDE: B Natriuretic Peptide: 131.3 pg/mL — ABNORMAL HIGH (ref 0.0–100.0)

## 2021-11-15 LAB — TROPONIN I (HIGH SENSITIVITY)
Troponin I (High Sensitivity): 22 ng/L — ABNORMAL HIGH (ref <18)
Troponin I (High Sensitivity): 20 ng/L — ABNORMAL HIGH (ref <18)

## 2021-11-15 MED ORDER — SODIUM CHLORIDE 0.9 % IV SOLN
1.0000 g | Freq: Once | INTRAVENOUS | Status: AC
  Administered 2021-11-15: 1 g via INTRAVENOUS
  Filled 2021-11-15: qty 10

## 2021-11-15 MED ORDER — CEPHALEXIN 500 MG PO CAPS: 500.0000 mg | ORAL_CAPSULE | Freq: Two times a day (BID) | ORAL | 0 refills | Status: DC

## 2021-11-15 NOTE — ED Notes (Signed)
Patient reports that he was smelling paint and became short of breath.  Patient is oriented only to self at this time.  Denies any pain.

## 2021-11-15 NOTE — ED Notes (Signed)
Pt assisted to bathroom in wheelchair and returned to bed without incident.

## 2021-11-15 NOTE — ED Provider Notes (Signed)
Kyle Whitaker  ____________________________________________   Event Date/Time   First MD Initiated Contact with Patient 11/15/21 0300     (approximate)  I have reviewed the triage vital signs and the nursing notes.   HISTORY  Chief Complaint Altered Mental Status    HPI Kyle Whitaker is a 87 y.o. male with history of hypertension, hyperlipidemia, peripheral vascular disease, CAD, CHF, a flutter, dementia who presents to the emergency department EMS from the [facility] facility with complaints of shortness of breath.  Patient tells me that they were spraying something in his room that caused him to feel short of breath.  Discussed with nursing staff at [facility] that states that this is not true but he is often confused due to his dementia.  It appears he is at his baseline.  He denies any current shortness of breath.  No chest pain.  No known fevers, cough, vomiting or diarrhea.         Past Medical History:  Diagnosis Date   Anxiety    Arthritis    Cancer ([place]) 2005   Prostate   CHF (congestive heart failure) (HCC)    Coronary artery disease    Hyperlipidemia    Hypertension    Peripheral vascular disease ([place])    carotid stenosis   Shortness of breath dyspnea    on exertion    Patient Active Problem List   Diagnosis Date Noted   Benign hypertension with CKD (chronic kidney disease) stage III ([place]) 08/07/2020   Resides in skilled nursing facility 09/16/2018   Essential hypertension 02/17/2018   Healthcare maintenance 09/15/2017   Dementia arising in the senium and presenium ([place]) 07/21/2017   Acute renal failure (ARF) ([place]) 07/18/2017   ARF (acute renal failure) ([place]) 07/16/2017   Chest pain 06/06/2017   Chronic prescription benzodiazepine use 05/18/2017   H/O non-ST elevation myocardial infarction (NSTEMI) 05/18/2017   History of alcohol abuse 05/18/2017   Hyperlipidemia,  unspecified 05/18/2017   Prostate cancer ([place]) 05/18/2017   Hypertension 05/18/2017   Hallucinations 09/19/2016   Mild dementia 09/19/2016   Spells of formed visual hallucinations 09/19/2016   Carotid stenosis 07/17/2016   Shortness of breath 06/28/2016   Carotid artery stenosis    Chronic coronary artery disease    Carotid artery calcification, left 06/19/2016   Left-sided carotid artery disease ([place]) 06/19/2016   Primary osteoarthritis of left hip 10/29/2015   Chronic diastolic CHF (congestive heart failure) ([place]) 03/15/2015    Past Surgical History:  Procedure Laterality Date   CATARACT EXTRACTION W/PHACO Right 12/10/2016   Procedure: CATARACT EXTRACTION PHACO AND INTRAOCULAR LENS PLACEMENT (IOC);  Surgeon: Eulogio Bear, MD;  Location: [facility] ORS;  Service: Ophthalmology;  Laterality: Right;  Lot #[id number] H US:01:31.2 AP%:18.4 CDE:17.30   CATARACT EXTRACTION W/PHACO Left 01/07/2017   Procedure: CATARACT EXTRACTION PHACO AND INTRAOCULAR LENS PLACEMENT ([place]);  Surgeon: Eulogio Bear, MD;  Location: [facility] ORS;  Service: Ophthalmology;  Laterality: Left;  Lot# [id number] H [place]: 01:05.8 AP%: 16.3 CDE: 10.72   ENDARTERECTOMY Right 07/17/2016   Procedure: ENDARTERECTOMY CAROTID;  Surgeon: Katha Cabal, MD;  Location: [facility] ORS;  Service: Vascular;  Laterality: Right;   JOINT REPLACEMENT Right 2011   Total Hip Replacement, [place]   JOINT REPLACEMENT Right 2011   Total Knee Replacement, [facility]   JOINT REPLACEMENT Left 2015   total hip replacement   KNEE ARTHROSCOPY Right 2011   PROSTATE SURGERY     TOTAL  HIP ARTHROPLASTY Left 10/29/2015   Procedure: TOTAL HIP ARTHROPLASTY ANTERIOR APPROACH;  Surgeon: Hessie Knows, MD;  Location: [facility] ORS;  Service: Orthopedics;  Laterality: Left;    Prior to Admission medications   Medication Sig Start Date End Date Taking? Authorizing Provider  cephALEXin (KEFLEX) 500 MG capsule Take 1 capsule (500 mg total) by mouth 2 (two) times daily. 11/15/21   Yes Samaa Ueda, Delice Bison, DO  amLODipine (NORVASC) 10 MG tablet  07/19/20   [provider]  amLODipine (NORVASC) 5 MG tablet Take 1 tablet (5 mg total) by mouth daily. Patient not taking: Reported on 08/07/2020 09/21/16   Gladstone Lighter, MD  ARIPiprazole (ABILIFY) 10 MG tablet Take by mouth. Patient not taking: Reported on 08/07/2020    [provider]  aspirin EC 81 MG EC tablet Take 1 tablet (81 mg total) by mouth daily. 07/18/16   Algernon Huxley, MD  atenolol (TENORMIN) 25 MG tablet Take 25 mg by mouth daily.    [provider]  clonazePAM (KLONOPIN) 0.5 MG tablet Take by mouth. Patient not taking: Reported on 08/07/2020    [provider]  cyanocobalamin (,VITAMIN B-12,) 1000 MCG/ML injection Inject into the muscle.    [provider]  diazepam (VALIUM) 2 MG tablet Take 1 tablet (2 mg total) by mouth 2 (two) times daily. Patient not taking: Reported on 08/07/2020 07/20/17   Epifanio Lesches, MD  diazepam (VALIUM) 2 MG tablet Take 1 tablet by mouth 2 (two) times daily. Patient not taking: Reported on 08/07/2020 06/28/18   [provider]  DIPHENHIST 25 MG capsule Take by mouth. 04/22/20   [provider]  donepezil (ARICEPT) 10 MG tablet Take 10 mg by mouth at bedtime.    [provider]  EQL ANTACID/ANTI-GAS 200-200-20 MG/5ML suspension Take by mouth. 04/22/20   [provider]  Eszopiclone 3 MG TABS Take by mouth. Patient not taking: Reported on 08/07/2020    [provider]  feeding supplement, ENSURE ENLIVE, (ENSURE ENLIVE) LIQD Take 237 mLs by mouth 3 (three) times daily between meals. 07/20/17   Epifanio Lesches, MD  folic acid (FOLVITE) 1 MG tablet Take 1 mg by mouth daily. Patient not taking: Reported on 08/07/2020    [provider]  guaiFENesin (ROBITUSSIN) 100 MG/5ML liquid Take by mouth. 04/22/20   [provider]  hydrochlorothiazide (HYDRODIURIL) 25 MG tablet Take 25 mg by mouth  daily. Patient not taking: Reported on 08/07/2020    [provider]  hydrOXYzine (VISTARIL) 25 MG capsule Take by mouth. 04/22/20   [provider]  lisinopril-hydrochlorothiazide (PRINZIDE,ZESTORETIC) 20-25 MG tablet Take 1 tablet by mouth daily. Patient not taking: Reported on 08/07/2020 02/07/18   [provider]  lithium citrate 300 MG/5ML solution Take by mouth. Patient not taking: Reported on 08/07/2020    [provider]  LOPERAMIDE A-D 2 MG tablet 2 mg elemental calcium/kg/hr. 04/22/20   [provider]  Magnesium Oxide 500 MG TABS Take 500 mg by mouth at bedtime. Patient not taking: Reported on 08/07/2020    [provider]  Melatonin 3 MG TABS Take by mouth. Patient not taking: Reported on 08/07/2020    [provider]  memantine (NAMENDA) 10 MG tablet Take 10 mg by mouth at bedtime. Patient not taking: Reported on 08/07/2020    [provider]  memantine [facility]) 5 MG tablet  05/25/20   [provider]  MILK OF MAGNESIA 400 MG/5ML suspension Take by mouth. 04/22/20   [provider]  mirtazapine (REMERON) 30 MG tablet Take 30 mg by mouth at bedtime. Patient not taking: Reported on 08/07/2020    [provider]  Multiple Vitamin (MULTIVITAMIN WITH MINERALS) TABS tablet Take 1 tablet by mouth daily. 07/20/17   Epifanio Lesches, MD  prednisoLONE acetate (PRED FORTE) 1 % ophthalmic suspension Administer 1 drop to both eyes Four (4) times a day. Patient not taking: Reported on 08/07/2020 03/11/17   [provider]  pyridOXINE (B-6) 50 MG tablet Take by mouth. Patient not taking: Reported on 08/07/2020    [provider]  risperiDONE (RISPERDAL M-TABS) 0.5 MG disintegrating tablet Take 1 tablet (0.5 mg total) by mouth at bedtime. Patient not taking: Reported on 08/07/2020 09/20/16   Gladstone Lighter, MD  risperiDONE (RISPERDAL) 0.5 MG tablet Take by mouth. Patient not taking:  Reported on 08/07/2020 09/23/16   [provider]  thiamine 100 MG tablet Take 1 tablet (100 mg total) by mouth daily. Patient not taking: Reported on 08/07/2020 07/20/17   Epifanio Lesches, MD  Vitamin D, Ergocalciferol, (DRISDOL) 50000 units CAPS capsule Take 50,000 Units by mouth once a week. Fridays Patient not taking: Reported on 08/07/2020    [provider]  zinc gluconate 50 MG tablet Take 50 mg by mouth at bedtime. Patient not taking: Reported on 08/07/2020    [provider]    Allergies Vicodin [hydrocodone-acetaminophen] and Penicillins  Family History  Problem Relation Age of Onset   CAD Mother     Social History Social History   Tobacco Use   Smoking status: Former    Packs/day: 1.00    Types: Cigarettes   Smokeless tobacco: Never   Tobacco comments:    quit 50 years ago  Substance Use Topics   Alcohol use: No   Drug use: No    Review of Systems  Level 5 caveat secondary to dementia  ____________________________________________   PHYSICAL EXAM:  VITAL SIGNS: ED Triage Vitals  Enc Vitals Group     BP 11/14/21 2311 (!) 159/89     Pulse Rate 11/14/21 2311 87     Resp 11/14/21 2311 16     Temp 11/14/21 2311 98.6 F (37 C)     Temp Source 11/14/21 2311 Oral     SpO2 11/14/21 2311 100 %     Weight 11/14/21 2331 187 lb 6.3 oz (85 kg)     Height --      Head Circumference --      Peak Flow --      Pain Score --      Pain Loc --      Pain Edu? --      Excl. in [place]? --    CONSTITUTIONAL: Alert and oriented to person and place but not time.  Pleasantly demented.   HEAD: Normocephalic EYES: Conjunctivae clear, pupils appear equal, EOM appear intact ENT: normal nose; moist mucous membranes NECK: Supple, normal ROM CARD: Irregularly irregular but rate controlled; S1 and S2 appreciated; no murmurs, no clicks, no rubs, no gallops RESP: Normal chest excursion without splinting or tachypnea; breath sounds clear and equal  bilaterally; no wheezes, no rhonchi, no rales, no hypoxia or respiratory distress, speaking full sentences ABD/GI: Normal bowel sounds; non-distended; soft, non-tender, no rebound, no guarding, no peritoneal signs, no hepatosplenomegaly BACK: The back appears normal EXT: Normal ROM in all joints; no deformity noted, no edema; no cyanosis, no calf tenderness or calf swelling SKIN: Normal color for age and race; warm; no rash on  exposed skin NEURO: Moves all extremities equally, normal phonation speech, no facial asymmetry PSYCH: The patient's mood and manner are appropriate.  ____________________________________________   LABS (all labs ordered are listed, but only abnormal results are displayed)  Labs Reviewed  URINALYSIS, COMPLETE (UACMP) WITH MICROSCOPIC - Abnormal; Notable for the following components:      Result Value   Color, Urine YELLOW (*)    APPearance HAZY (*)    Protein, ur 30 (*)    Nitrite POSITIVE (*)    Leukocytes,Ua TRACE (*)    Bacteria, UA MANY (*)    All other components within normal limits  BRAIN NATRIURETIC PEPTIDE - Abnormal; Notable for the following components:   B Natriuretic Peptide 131.3 (*)    All other components within normal limits  TROPONIN I (HIGH SENSITIVITY) - Abnormal; Notable for the following components:   Troponin I (High Sensitivity) 22 (*)    All other components within normal limits  TROPONIN I (HIGH SENSITIVITY) - Abnormal; Notable for the following components:   Troponin I (High Sensitivity) 20 (*)    All other components within normal limits  URINE CULTURE  COMPREHENSIVE METABOLIC PANEL  CBC  CBG MONITORING, ED   ____________________________________________  EKG   EKG Interpretation  Date/Time:  Saturday November 15 2021 00:46:27 EST Ventricular Rate:  88 PR Interval:    QRS Duration: 112 QT Interval:  398 QTC Calculation: 481 R Axis:   -67 Text Interpretation: Atrial flutter with variable A-V block Left axis deviation  Incomplete right bundle branch block Anterior infarct , age undetermined Abnormal ECG No significant change since last tracing Confirmed by Pryor Curia [phone]) on 11/15/2021 3:17:53 AM        ____________________________________________  RADIOLOGY Jessie Foot Leilynn Pilat, personally viewed and evaluated these images (plain radiographs) as part of my medical decision making, as well as reviewing the written report by the radiologist.  ED MD interpretation: Chest x-ray shows no acute abnormality.  CT head unremarkable.  Official radiology report(s): DG Chest 2 View  Result Date: 11/15/2021 CLINICAL DATA:  Shortness of breath. EXAM: CHEST - 2 VIEW COMPARISON:  Chest radiograph dated 11/09/2021. FINDINGS: No focal consolidation, pleural effusion, pneumothorax. The cardiac silhouette is within limits. No acute osseous pathology. Degenerative changes of the spine. IMPRESSION: No active cardiopulmonary disease. Electronically Signed   By: Anner Crete M.D.   On: 11/15/2021 00:36   CT HEAD WO CONTRAST (5MM)  Result Date: 11/15/2021 CLINICAL DATA:  Encephalopathy EXAM: CT HEAD WITHOUT CONTRAST TECHNIQUE: Contiguous axial images were obtained from the base of the skull through the vertex without intravenous contrast. COMPARISON:  11/09/2021 FINDINGS: Brain: There is no mass, hemorrhage or extra-axial collection. There is generalized atrophy without lobar predilection. Hypodensity of the white matter is most commonly associated with chronic microvascular disease. Vascular: Atherosclerotic calcification of the internal carotid arteries at the skull base. No abnormal hyperdensity of the major intracranial arteries or dural venous sinuses. Skull: The visualized skull base, calvarium and extracranial soft tissues are normal. Sinuses/Orbits: No fluid levels or advanced mucosal thickening of the visualized paranasal sinuses. No mastoid or middle ear effusion. The orbits are normal. IMPRESSION: Generalized atrophy  and chronic microvascular ischemia without acute intracranial abnormality. Electronically Signed   By: Ulyses Jarred M.D.   On: 11/15/2021 00:51    ____________________________________________   PROCEDURES  Procedure(s) performed (including Critical Care):  Procedures    ____________________________________________   INITIAL IMPRESSION / ASSESSMENT AND PLAN / ED COURSE  As part of my medical  decision making, I reviewed the following data within the [place] notes reviewed and incorporated, Labs reviewed , EKG interpreted , Old EKG reviewed, Old chart reviewed, Radiograph reviewed , CT reviewed, and Notes from prior ED visits         Patient here with complaints of shortness of breath.  Initially tells me that he lives at [facility].  Attempted to get in touch with [facility] who states that they have no patient by this name.  Also attempted to get in touch with family without any answer, only getting an error message each time I try to call.  We were able to find out that patient came from [place].  When I spoke to them they state that he has a history of dementia and is often quite confused.  Head CT was obtained from triage prior to obtaining this information and shows no acute abnormality.  They state that he complained of shortness of breath.  He denies any shortness of breath currently.  His lungs are clear to auscultation and he has no increased work of breathing or hypoxia.  Chest x-ray obtained from triage shows no infiltrate, edema, pneumothorax, cardiomegaly, widened mediastinum.  EKG shows no new ischemic change.  He has atrial flutter which is his baseline.  He is currently rate controlled.  Doubt PE, dissection.  First troponin is mildly elevated which also appears to be his baseline.  We will check a second troponin as well as a BNP as he does have a history of CHF as well.  Labs otherwise appear unremarkable.  Normal  hemoglobin, electrolytes.  Urine does appear infected.  Previous urine cultures on review of records show that he has had Klebsiella, E. coli both sensitive to cephalosporins as well as Enterococcus.  Urine culture pending today.  Will give Rocephin.  ED PROGRESS  Patient second troponin is flat.  BNP appears at his baseline.  He again reports that he is feeling well with no shortness of breath and no complaints of pain.  Will discharge with prescription of Keflex for his UTI.  I feel he is safe for discharge home.   At this time, I do not feel there is any life-threatening condition present. I have reviewed, interpreted and discussed all results (EKG, imaging, lab, urine as appropriate) and exam findings with patient/family. I have reviewed nursing notes and appropriate previous records.  I feel the patient is safe to be discharged home without further emergent workup and can continue workup as an outpatient as needed. Discussed usual and customary return precautions. Patient/family verbalize understanding and are comfortable with this plan.  Outpatient follow-up has been provided as needed. All questions have been answered.  ____________________________________________   FINAL CLINICAL IMPRESSION(S) / ED DIAGNOSES  Final diagnoses:  Shortness of breath  Acute UTI     ED Discharge Orders          Ordered    cephALEXin (KEFLEX) 500 MG capsule  2 times daily        11/15/21 0456            *Please Whitaker:  Kyle Whitaker was evaluated in Emergency Department on 11/15/2021 for the symptoms described in the history of present illness. He was evaluated in the context of the global COVID-19 pandemic, which necessitated consideration that the patient might be at risk for infection with the SARS-CoV-2 virus that causes COVID-19. Institutional protocols and algorithms that pertain to the evaluation of patients at  risk for COVID-19 are in a state of rapid change based on information released by  regulatory bodies including the CDC and federal and state organizations. These policies and algorithms were followed during the patient's care in the ED.  Some ED evaluations and interventions may be delayed as a result of limited staffing during and the pandemic.*   Whitaker:  This document was prepared using Dragon voice recognition software and may include unintentional dictation errors.    Ruta Capece, Delice Bison, DO 11/15/21 [phone]

## 2021-11-15 NOTE — ED Notes (Signed)
Pt discharged back to [facility] via EMS. VS stable.  Report given by previous nurse.

## 2021-11-15 NOTE — ED Notes (Signed)
Patient assisted to restroom.  

## 2021-11-17 LAB — URINE CULTURE: Culture: >=100000 — AB

## 2022-07-05 ENCOUNTER — Emergency Department
Admission: EM | Admit: 2022-07-05 | Discharge: 2022-07-05 | Disposition: A | Discharge status: Home / Self Care | Payer: Medicare Other | Attending: Emergency Medicine | Admitting: Emergency Medicine

## 2022-07-05 ENCOUNTER — Emergency Department: Payer: Medicare Other

## 2022-07-05 ENCOUNTER — Other Ambulatory Visit: Payer: Self-pay

## 2022-07-05 DIAGNOSIS — F0390 Unspecified dementia without behavioral disturbance: Secondary | ICD-10-CM | POA: Insufficient documentation

## 2022-07-05 DIAGNOSIS — I110 Hypertensive heart disease with heart failure: Secondary | ICD-10-CM | POA: Diagnosis not present

## 2022-07-05 DIAGNOSIS — R0602 Shortness of breath: Secondary | ICD-10-CM | POA: Diagnosis present

## 2022-07-05 DIAGNOSIS — I509 Heart failure, unspecified: Secondary | ICD-10-CM | POA: Diagnosis not present

## 2022-07-05 DIAGNOSIS — R600 Localized edema: Secondary | ICD-10-CM | POA: Diagnosis not present

## 2022-07-05 LAB — COMPREHENSIVE METABOLIC PANEL
ALT: 11 U/L (ref 0–44)
AST: 20 U/L (ref 15–41)
Albumin: 4.2 g/dL (ref 3.5–5.0)
Alkaline Phosphatase: 95 U/L (ref 38–126)
Anion gap: 9 (ref 5–15)
BUN: 21 mg/dL (ref 8–23)
CO2: 23 mmol/L (ref 22–32)
Calcium: 9.2 mg/dL (ref 8.9–10.3)
Chloride: 107 mmol/L (ref 98–111)
Creatinine, Ser: 1.08 mg/dL (ref 0.61–1.24)
GFR, Estimated: >60 mL/min (ref >60)
Glucose, Bld: 97 mg/dL (ref 70–99)
Potassium: 4.4 mmol/L (ref 3.5–5.1)
Sodium: 139 mmol/L (ref 135–145)
Total Bilirubin: 1.1 mg/dL (ref 0.3–1.2)
Total Protein: 7.1 g/dL (ref 6.5–8.1)

## 2022-07-05 LAB — CBC WITH DIFFERENTIAL/PLATELET
Abs Immature Granulocytes: 0.01 10*3/uL (ref 0.00–0.07)
Basophils Absolute: 0 10*3/uL (ref 0.0–0.1)
Basophils Relative: 0 %
Eosinophils Absolute: 0 10*3/uL (ref 0.0–0.5)
Eosinophils Relative: 0 %
HCT: 47.3 % (ref 39.0–52.0)
Hemoglobin: 14.8 g/dL (ref 13.0–17.0)
Immature Granulocytes: 0 %
Lymphocytes Relative: 21 %
Lymphs Abs: 1.4 10*3/uL (ref 0.7–4.0)
MCH: 30.2 pg (ref 26.0–34.0)
MCHC: 31.3 g/dL (ref 30.0–36.0)
MCV: 96.5 fL (ref 80.0–100.0)
Monocytes Absolute: 0.6 10*3/uL (ref 0.1–1.0)
Monocytes Relative: 9 %
Neutro Abs: 4.8 10*3/uL (ref 1.7–7.7)
Neutrophils Relative %: 70 %
Platelets: 200 10*3/uL (ref 150–400)
RBC: 4.9 MIL/uL (ref 4.22–5.81)
RDW: 15.4 % (ref 11.5–15.5)
WBC: 6.9 10*3/uL (ref 4.0–10.5)
nRBC: 0 % (ref 0.0–0.2)

## 2022-07-05 LAB — TROPONIN I (HIGH SENSITIVITY)
Troponin I (High Sensitivity): 18 ng/L — ABNORMAL HIGH (ref <18)
Troponin I (High Sensitivity): 26 ng/L — ABNORMAL HIGH (ref <18)

## 2022-07-05 LAB — BRAIN NATRIURETIC PEPTIDE: B Natriuretic Peptide: 197 pg/mL — ABNORMAL HIGH (ref 0.0–100.0)

## 2022-07-05 NOTE — ED Provider Notes (Signed)
[facility] Provider Note    Event Date/Time   First MD Initiated Contact with Patient 07/05/22 [phone]     (approximate)   History   Shortness of Breath   HPI  Kyle Whitaker is a 88 y.o. male who presents to the ED for evaluation of Shortness of Breath   Review outpatient vascular surgery visit from 2021.  History of bilateral carotid artery stenosis, HTN, CHF, dementia  Patient presents from his SNF for evaluation of an episode of shortness of breath.  He reports earlier he was "at at a meeting" and he felt short of breath.  Reports he felt winded for perhaps a couple hours but has since resolved and he feels better now.  Denies complaints.  Reports no associated symptoms such as cough, chest pain, syncope, dizziness, emesis, abdominal pain or back pain.  No falls or trauma.   Physical Exam   Triage Vital Signs: ED Triage Vitals  Enc Vitals Group     BP 07/05/22 0209 (!) 151/110     Pulse Rate 07/05/22 0209 91     Resp 07/05/22 0209 18     Temp 07/05/22 0209 (!) 97.5 F (36.4 C)     Temp Source 07/05/22 0209 Oral     SpO2 07/05/22 0209 98 %     Weight --      Height --      Head Circumference --      Peak Flow --      Pain Score 07/05/22 0148 0     Pain Loc --      Pain Edu? --      Excl. in [place]? --     Most recent vital signs: Vitals:   07/05/22 0430 07/05/22 0530  BP: (!) 142/76 (!) 167/122  Pulse: 92 94  Resp: 17 15  Temp:    SpO2: 93% 100%    General: Awake, no distress.  Pleasant and conversational.  Well-appearing. CV:  Good peripheral perfusion. RRR Resp:  Normal effort.  CTAB Abd:  No distention.  Soft and benign MSK:  No deformity noted.  Trace pitting edema to bilateral lower extremities without overlying skin changes or signs of trauma. Neuro:  No focal deficits appreciated. Other:     ED Results / Procedures / Treatments   Labs (all labs ordered are listed, but only abnormal results are displayed) Labs Reviewed   TROPONIN I (HIGH SENSITIVITY) - Abnormal; Notable for the following components:      Result Value   Troponin I (High Sensitivity) 18 (*)    All other components within normal limits  CBC WITH DIFFERENTIAL/PLATELET  COMPREHENSIVE METABOLIC PANEL  BRAIN NATRIURETIC PEPTIDE  TROPONIN I (HIGH SENSITIVITY)    EKG Sinus rhythm with a rate of 85 bpm.  Tremulous baseline makes fine detail difficult to interpret.  Leftward axis and left bundle morphology without STEMI.  RADIOLOGY CXR interpreted by me without evidence of acute cardiopulmonary pathology.  Official radiology report(s): DG Chest 2 View  Result Date: 07/05/2022 CLINICAL DATA:  Shortness of breath EXAM: CHEST - 2 VIEW COMPARISON:  11/15/2021 FINDINGS: Cardiac shadow is stable. Aortic calcifications are again noted. Lungs well aerated bilaterally. No focal infiltrate or effusion is seen. No bony abnormality is noted. Rounded densities are noted on the lateral film anteriorly which are felt to be related to extrinsic artifact. IMPRESSION: No acute abnormality noted. Likely extrinsic artifact along the anterior aspect of the oral film. Electronically Signed   By: Elta Guadeloupe  Lukens M.D.   On: 07/05/2022 02:42    PROCEDURES and INTERVENTIONS:  .1-3 Lead EKG Interpretation  Performed by: Vladimir Crofts, MD Authorized by: Vladimir Crofts, MD     Interpretation: normal     ECG rate:  86   ECG rate assessment: normal     Rhythm: sinus rhythm     Ectopy: none     Conduction: normal     Medications - No data to display   IMPRESSION / MDM / [place] / ED COURSE  I reviewed the triage vital signs and the nursing notes.  Differential diagnosis includes, but is not limited to, ACS, PTX, PNA, muscle strain/spasm, PE, dissection ,CHF exacerbation,  {Patient presents with symptoms of an acute illness or injury that is potentially life-threatening.  88 year old patient presents to the ED after a resolved episode of dyspnea.  He looks  systemically well and is asymptomatic here in the ED. no ischemic features on EKG and his first troponin is just marginally elevated, consistent with many previous values for him.  CBC and CMP are normal.  CXR is clear.  We will add on a BNP due to his history of CHF as well as sent for a second troponin.  He is signed out to oncoming provider to follow-up on these studies.  I suspect to be suitable for return to his facility.  Clinical Course as of 07/05/22 3143  Sun Jul 05, 2022  0637 Reassessed.  Still asymptomatic and reports feeling fine. [DS]    Clinical Course User Index [DS] Vladimir Crofts, MD     FINAL CLINICAL IMPRESSION(S) / ED DIAGNOSES   Final diagnoses:  SOB (shortness of breath)     Rx / DC Orders   ED Discharge Orders     None        Note:  This document was prepared using Dragon voice recognition software and may include unintentional dictation errors.   Vladimir Crofts, MD 07/05/22 [phone]

## 2022-07-05 NOTE — ED Notes (Signed)
Lab called to draw bloodwork.

## 2022-07-05 NOTE — ED Triage Notes (Signed)
First RN note: Pt to ED via [facility] from [facility] with c/o SHOB. Per EMS pt is no longer [facility] but patient wants to know why he was feeling SHOB. Per EMS pt with hx of hallucinations, at baseline mental status per staff at the [facility].    98% RA 88HR 154/96

## 2022-07-05 NOTE — ED Notes (Signed)
Lab at bedside

## 2024-03-28 DEATH — deceased
